# Patient Record
Sex: Male | Born: 1945
Health system: Southern US, Community
[De-identification: ages and names within clinical notes are randomized; demographics above are authoritative.]

## PROBLEM LIST (undated history)

## (undated) DIAGNOSIS — N529 Male erectile dysfunction, unspecified: Secondary | ICD-10-CM

## (undated) DIAGNOSIS — L719 Rosacea, unspecified: Secondary | ICD-10-CM

## (undated) DIAGNOSIS — H409 Unspecified glaucoma: Secondary | ICD-10-CM

## (undated) DIAGNOSIS — J189 Pneumonia, unspecified organism: Secondary | ICD-10-CM

## (undated) DIAGNOSIS — D684 Acquired coagulation factor deficiency: Secondary | ICD-10-CM

## (undated) DIAGNOSIS — D126 Benign neoplasm of colon, unspecified: Secondary | ICD-10-CM

## (undated) DIAGNOSIS — E119 Type 2 diabetes mellitus without complications: Secondary | ICD-10-CM

## (undated) DIAGNOSIS — K219 Gastro-esophageal reflux disease without esophagitis: Secondary | ICD-10-CM

## (undated) DIAGNOSIS — I2699 Other pulmonary embolism without acute cor pulmonale: Secondary | ICD-10-CM

## (undated) DIAGNOSIS — E786 Lipoprotein deficiency: Secondary | ICD-10-CM

## (undated) DIAGNOSIS — E785 Hyperlipidemia, unspecified: Secondary | ICD-10-CM

## (undated) DIAGNOSIS — D6861 Antiphospholipid syndrome: Secondary | ICD-10-CM

## (undated) DIAGNOSIS — K648 Other hemorrhoids: Secondary | ICD-10-CM

## (undated) DIAGNOSIS — N400 Enlarged prostate without lower urinary tract symptoms: Secondary | ICD-10-CM

## (undated) DIAGNOSIS — G40909 Epilepsy, unspecified, not intractable, without status epilepticus: Secondary | ICD-10-CM

## (undated) DIAGNOSIS — I82409 Acute embolism and thrombosis of unspecified deep veins of unspecified lower extremity: Principal | ICD-10-CM

## (undated) DIAGNOSIS — I10 Essential (primary) hypertension: Secondary | ICD-10-CM

## (undated) HISTORY — DX: Rosacea, unspecified: L71.9

## (undated) HISTORY — DX: Benign prostatic hyperplasia without lower urinary tract symptoms: N40.0

## (undated) HISTORY — DX: Gastro-esophageal reflux disease without esophagitis: K21.9

## (undated) HISTORY — DX: Lipoprotein deficiency: E78.6

## (undated) HISTORY — DX: Hyperlipidemia, unspecified: E78.5

## (undated) HISTORY — DX: Type 2 diabetes mellitus without complications: E11.9

## (undated) HISTORY — DX: Other hemorrhoids: K64.8

## (undated) HISTORY — PX: PROSTATE SURGERY: SHX751

## (undated) HISTORY — DX: Benign neoplasm of colon, unspecified: D12.6

## (undated) HISTORY — DX: Epilepsy, unspecified, not intractable, without status epilepticus: G40.909

## (undated) HISTORY — DX: Essential (primary) hypertension: I10

## (undated) HISTORY — DX: Other pulmonary embolism without acute cor pulmonale: I26.99

## (undated) HISTORY — DX: Male erectile dysfunction, unspecified: N52.9

## (undated) HISTORY — PX: CATARACT EXTRACTION, BILATERAL: SHX1313

## (undated) HISTORY — PX: INGUINAL HERNIA REPAIR: SUR1180

## (undated) HISTORY — DX: Antiphospholipid syndrome: D68.61

## (undated) HISTORY — PX: OTHER SURGICAL HISTORY: SHX169

## (undated) HISTORY — PX: ESOPHAGOGASTRODUODENOSCOPY: SHX1529

## (undated) HISTORY — DX: Acquired coagulation factor deficiency: D68.4

## (undated) HISTORY — DX: Unspecified glaucoma: H40.9

## (undated) HISTORY — PX: HERNIA REPAIR: SHX51

## (undated) HISTORY — DX: Acute embolism and thrombosis of unspecified deep veins of unspecified lower extremity: I82.409

---
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
## 1990-02-20 DIAGNOSIS — I2699 Other pulmonary embolism without acute cor pulmonale: Secondary | ICD-10-CM

## 1990-02-20 HISTORY — DX: Other pulmonary embolism without acute cor pulmonale: I26.99

## 2001-04-19 HISTORY — PX: OTHER SURGICAL HISTORY: SHX169

## 2002-07-07 HISTORY — PX: OTHER SURGICAL HISTORY: SHX169

## 2005-06-29 ENCOUNTER — Ambulatory Visit: Payer: Self-pay | Admitting: Internal Medicine

## 2005-06-30 ENCOUNTER — Ambulatory Visit: Payer: Self-pay

## 2005-07-06 ENCOUNTER — Ambulatory Visit: Payer: Self-pay | Admitting: Internal Medicine

## 2005-07-13 ENCOUNTER — Ambulatory Visit: Payer: Self-pay | Admitting: Internal Medicine

## 2005-07-24 ENCOUNTER — Ambulatory Visit: Payer: Self-pay | Admitting: Internal Medicine

## 2005-08-21 ENCOUNTER — Ambulatory Visit: Payer: Self-pay | Admitting: Internal Medicine

## 2005-08-30 ENCOUNTER — Ambulatory Visit: Payer: Self-pay | Admitting: Internal Medicine

## 2005-09-27 ENCOUNTER — Ambulatory Visit: Payer: Self-pay | Admitting: Family Medicine

## 2005-10-09 ENCOUNTER — Ambulatory Visit: Payer: Self-pay | Admitting: Internal Medicine

## 2005-10-24 ENCOUNTER — Ambulatory Visit: Payer: Self-pay | Admitting: Internal Medicine

## 2005-11-07 ENCOUNTER — Ambulatory Visit: Payer: Self-pay | Admitting: Internal Medicine

## 2005-11-29 ENCOUNTER — Ambulatory Visit: Payer: Self-pay | Admitting: Internal Medicine

## 2006-01-05 ENCOUNTER — Ambulatory Visit: Payer: Self-pay | Admitting: Internal Medicine

## 2006-03-07 DIAGNOSIS — E119 Type 2 diabetes mellitus without complications: Secondary | ICD-10-CM

## 2006-03-07 DIAGNOSIS — L719 Rosacea, unspecified: Secondary | ICD-10-CM

## 2006-03-07 DIAGNOSIS — N400 Enlarged prostate without lower urinary tract symptoms: Secondary | ICD-10-CM

## 2006-03-08 DIAGNOSIS — J309 Allergic rhinitis, unspecified: Secondary | ICD-10-CM | POA: Insufficient documentation

## 2006-03-08 DIAGNOSIS — E785 Hyperlipidemia, unspecified: Secondary | ICD-10-CM

## 2006-03-16 ENCOUNTER — Ambulatory Visit: Payer: Self-pay | Admitting: Internal Medicine

## 2006-04-02 ENCOUNTER — Ambulatory Visit: Payer: Self-pay | Admitting: Internal Medicine

## 2006-04-02 LAB — CONVERTED CEMR LAB: Hgb A1c MFr Bld: 6.1 % — ABNORMAL HIGH (ref 4.6–6.0)

## 2006-04-17 ENCOUNTER — Ambulatory Visit: Payer: Self-pay | Admitting: Internal Medicine

## 2006-04-23 ENCOUNTER — Ambulatory Visit: Payer: Self-pay | Admitting: Internal Medicine

## 2006-04-30 ENCOUNTER — Ambulatory Visit: Payer: Self-pay | Admitting: Internal Medicine

## 2006-05-07 ENCOUNTER — Ambulatory Visit: Payer: Self-pay | Admitting: Internal Medicine

## 2006-05-07 ENCOUNTER — Encounter (INDEPENDENT_AMBULATORY_CARE_PROVIDER_SITE_OTHER): Payer: Self-pay | Admitting: Specialist

## 2006-05-07 HISTORY — PX: COLONOSCOPY: SHX174

## 2006-05-18 ENCOUNTER — Ambulatory Visit: Payer: Self-pay | Admitting: Internal Medicine

## 2006-05-18 LAB — CONVERTED CEMR LAB: Prothrombin Time: 15.4 s

## 2006-05-22 ENCOUNTER — Encounter (INDEPENDENT_AMBULATORY_CARE_PROVIDER_SITE_OTHER): Payer: Self-pay | Admitting: *Deleted

## 2006-05-25 ENCOUNTER — Encounter: Payer: Self-pay | Admitting: Internal Medicine

## 2006-05-30 ENCOUNTER — Ambulatory Visit: Payer: Self-pay

## 2006-05-30 ENCOUNTER — Ambulatory Visit: Payer: Self-pay | Admitting: Internal Medicine

## 2006-05-30 LAB — CONVERTED CEMR LAB
Basophils Absolute: 0 10*3/uL (ref 0.0–0.1)
Creatinine, Ser: 0.9 mg/dL (ref 0.4–1.5)
Eosinophils Absolute: 0.1 10*3/uL (ref 0.0–0.6)
GFR calc non Af Amer: 91 mL/min
Glucose, Bld: 152 mg/dL — ABNORMAL HIGH (ref 70–99)
HCT: 37.9 % — ABNORMAL LOW (ref 39.0–52.0)
Hemoglobin: 13.5 g/dL (ref 13.0–17.0)
Hgb A1c MFr Bld: 6.1 % — ABNORMAL HIGH (ref 4.6–6.0)
MCHC: 35.5 g/dL (ref 30.0–36.0)
MCV: 91.7 fL (ref 78.0–100.0)
Monocytes Absolute: 0.3 10*3/uL (ref 0.2–0.7)
Neutrophils Relative %: 77.9 % — ABNORMAL HIGH (ref 43.0–77.0)
Potassium: 4.1 meq/L (ref 3.5–5.1)
Sodium: 140 meq/L (ref 135–145)

## 2006-06-01 ENCOUNTER — Encounter: Payer: Self-pay | Admitting: Internal Medicine

## 2006-06-04 ENCOUNTER — Encounter: Payer: Self-pay | Admitting: Internal Medicine

## 2006-06-04 LAB — CONVERTED CEMR LAB: Prothrombin Time: 16.3 s

## 2006-06-06 ENCOUNTER — Ambulatory Visit: Payer: Self-pay | Admitting: Internal Medicine

## 2006-06-06 LAB — CONVERTED CEMR LAB
INR: 2
Prothrombin Time: 17.3 s

## 2006-06-08 ENCOUNTER — Encounter: Payer: Self-pay | Admitting: Internal Medicine

## 2006-06-08 LAB — CONVERTED CEMR LAB: Prothrombin Time: 2 s

## 2006-06-12 ENCOUNTER — Ambulatory Visit: Payer: Self-pay | Admitting: Internal Medicine

## 2006-06-20 ENCOUNTER — Ambulatory Visit: Payer: Self-pay | Admitting: Internal Medicine

## 2006-06-20 LAB — CONVERTED CEMR LAB: INR: 2.3

## 2006-07-13 ENCOUNTER — Ambulatory Visit: Payer: Self-pay | Admitting: Internal Medicine

## 2006-07-17 ENCOUNTER — Ambulatory Visit: Payer: Self-pay | Admitting: Internal Medicine

## 2006-07-23 ENCOUNTER — Telehealth: Payer: Self-pay | Admitting: Internal Medicine

## 2006-07-25 ENCOUNTER — Ambulatory Visit: Payer: Self-pay | Admitting: Internal Medicine

## 2006-08-01 ENCOUNTER — Ambulatory Visit: Payer: Self-pay | Admitting: Internal Medicine

## 2006-08-07 LAB — CONVERTED CEMR LAB
ALT: 23 units/L (ref 0–40)
AST: 22 units/L (ref 0–37)
LDL Cholesterol: 94 mg/dL (ref 0–99)
Total CHOL/HDL Ratio: 5.1
VLDL: 32 mg/dL (ref 0–40)

## 2006-08-08 ENCOUNTER — Telehealth (INDEPENDENT_AMBULATORY_CARE_PROVIDER_SITE_OTHER): Payer: Self-pay | Admitting: *Deleted

## 2006-08-10 ENCOUNTER — Ambulatory Visit: Payer: Self-pay | Admitting: Family Medicine

## 2006-08-10 LAB — CONVERTED CEMR LAB: INR: 2.5

## 2006-08-14 ENCOUNTER — Encounter: Payer: Self-pay | Admitting: Internal Medicine

## 2006-09-07 ENCOUNTER — Ambulatory Visit: Payer: Self-pay | Admitting: Internal Medicine

## 2006-09-19 ENCOUNTER — Ambulatory Visit: Payer: Self-pay | Admitting: Family Medicine

## 2006-10-10 ENCOUNTER — Ambulatory Visit: Payer: Self-pay | Admitting: Internal Medicine

## 2006-10-10 LAB — CONVERTED CEMR LAB: INR: 3.1

## 2006-10-30 ENCOUNTER — Telehealth: Payer: Self-pay | Admitting: Internal Medicine

## 2006-11-08 ENCOUNTER — Ambulatory Visit: Payer: Self-pay | Admitting: Internal Medicine

## 2006-11-08 DIAGNOSIS — F528 Other sexual dysfunction not due to a substance or known physiological condition: Secondary | ICD-10-CM

## 2006-11-08 LAB — CONVERTED CEMR LAB: INR: 3.3

## 2006-11-12 ENCOUNTER — Telehealth: Payer: Self-pay | Admitting: Internal Medicine

## 2006-11-12 LAB — CONVERTED CEMR LAB
BUN: 12 mg/dL (ref 6–23)
Calcium: 10 mg/dL (ref 8.4–10.5)
Chloride: 106 meq/L (ref 96–112)
Creatinine, Ser: 1 mg/dL (ref 0.4–1.5)
Creatinine,U: 66.7 mg/dL
GFR calc non Af Amer: 81 mL/min
Hgb A1c MFr Bld: 6 % (ref 4.6–6.0)
Microalb Creat Ratio: 3 mg/g (ref 0.0–30.0)
Microalb, Ur: 0.2 mg/dL (ref 0.0–1.9)

## 2006-11-23 ENCOUNTER — Ambulatory Visit: Payer: Self-pay | Admitting: Internal Medicine

## 2006-11-23 LAB — CONVERTED CEMR LAB: INR: 2.8

## 2006-12-24 ENCOUNTER — Ambulatory Visit: Payer: Self-pay | Admitting: Internal Medicine

## 2007-01-03 ENCOUNTER — Encounter: Payer: Self-pay | Admitting: Internal Medicine

## 2007-01-03 ENCOUNTER — Telehealth (INDEPENDENT_AMBULATORY_CARE_PROVIDER_SITE_OTHER): Payer: Self-pay | Admitting: *Deleted

## 2007-01-07 ENCOUNTER — Ambulatory Visit: Payer: Self-pay | Admitting: Internal Medicine

## 2007-01-22 ENCOUNTER — Ambulatory Visit: Payer: Self-pay | Admitting: Internal Medicine

## 2007-01-22 LAB — CONVERTED CEMR LAB: INR: 2.3

## 2007-02-08 ENCOUNTER — Encounter: Payer: Self-pay | Admitting: Internal Medicine

## 2007-02-08 LAB — CONVERTED CEMR LAB: PSA: 3.24 ng/mL

## 2007-02-19 ENCOUNTER — Ambulatory Visit: Payer: Self-pay | Admitting: Internal Medicine

## 2007-03-20 ENCOUNTER — Ambulatory Visit: Payer: Self-pay | Admitting: Internal Medicine

## 2007-03-25 LAB — CONVERTED CEMR LAB: Hgb A1c MFr Bld: 6.1 % — ABNORMAL HIGH (ref 4.6–6.0)

## 2007-04-15 ENCOUNTER — Ambulatory Visit: Payer: Self-pay | Admitting: Internal Medicine

## 2007-04-15 LAB — CONVERTED CEMR LAB: INR: 2.4

## 2007-05-13 ENCOUNTER — Ambulatory Visit: Payer: Self-pay | Admitting: Internal Medicine

## 2007-05-13 LAB — CONVERTED CEMR LAB: INR: 2.5

## 2007-06-03 ENCOUNTER — Telehealth (INDEPENDENT_AMBULATORY_CARE_PROVIDER_SITE_OTHER): Payer: Self-pay | Admitting: *Deleted

## 2007-06-13 ENCOUNTER — Ambulatory Visit: Payer: Self-pay | Admitting: Internal Medicine

## 2007-07-05 ENCOUNTER — Ambulatory Visit: Payer: Self-pay | Admitting: Internal Medicine

## 2007-07-05 DIAGNOSIS — B351 Tinea unguium: Secondary | ICD-10-CM

## 2007-07-05 LAB — CONVERTED CEMR LAB
INR: 2.3
Prothrombin Time: 18.6 s

## 2007-07-10 LAB — CONVERTED CEMR LAB
ALT: 23 units/L (ref 0–53)
BUN: 18 mg/dL (ref 6–23)
Basophils Relative: 0.1 % (ref 0.0–1.0)
Chloride: 107 meq/L (ref 96–112)
Creatinine, Ser: 1.2 mg/dL (ref 0.4–1.5)
Eosinophils Absolute: 0 10*3/uL (ref 0.0–0.7)
Eosinophils Relative: 0.9 % (ref 0.0–5.0)
GFR calc non Af Amer: 65 mL/min
Glucose, Bld: 121 mg/dL — ABNORMAL HIGH (ref 70–99)
HCT: 44.3 % (ref 39.0–52.0)
Hgb A1c MFr Bld: 6 % (ref 4.6–6.0)
MCV: 94.9 fL (ref 78.0–100.0)
Microalb Creat Ratio: 28.6 mg/g (ref 0.0–30.0)
Monocytes Absolute: 0.4 10*3/uL (ref 0.1–1.0)
Monocytes Relative: 7.8 % (ref 3.0–12.0)
Neutrophils Relative %: 67.4 % (ref 43.0–77.0)
Platelets: 160 10*3/uL (ref 150–400)
Potassium: 4.4 meq/L (ref 3.5–5.1)
RBC: 4.67 M/uL (ref 4.22–5.81)
TSH: 2.06 microintl units/mL (ref 0.35–5.50)
Total CHOL/HDL Ratio: 6.3
WBC: 4.5 10*3/uL (ref 4.5–10.5)

## 2007-08-02 ENCOUNTER — Ambulatory Visit: Payer: Self-pay | Admitting: Internal Medicine

## 2007-08-02 LAB — CONVERTED CEMR LAB: Prothrombin Time: 2.4 s

## 2007-08-05 ENCOUNTER — Telehealth (INDEPENDENT_AMBULATORY_CARE_PROVIDER_SITE_OTHER): Payer: Self-pay | Admitting: *Deleted

## 2007-08-14 ENCOUNTER — Telehealth (INDEPENDENT_AMBULATORY_CARE_PROVIDER_SITE_OTHER): Payer: Self-pay | Admitting: *Deleted

## 2007-08-20 ENCOUNTER — Ambulatory Visit: Payer: Self-pay | Admitting: Internal Medicine

## 2007-09-22 ENCOUNTER — Encounter: Admission: RE | Admit: 2007-09-22 | Discharge: 2007-09-22 | Payer: Self-pay | Admitting: Specialist

## 2007-09-26 ENCOUNTER — Telehealth (INDEPENDENT_AMBULATORY_CARE_PROVIDER_SITE_OTHER): Payer: Self-pay | Admitting: *Deleted

## 2007-09-27 ENCOUNTER — Ambulatory Visit: Payer: Self-pay | Admitting: Internal Medicine

## 2007-10-15 ENCOUNTER — Encounter: Payer: Self-pay | Admitting: Internal Medicine

## 2007-10-30 ENCOUNTER — Ambulatory Visit: Payer: Self-pay | Admitting: Internal Medicine

## 2007-10-30 LAB — CONVERTED CEMR LAB
INR: 2.5
Prothrombin Time: 19.3 s

## 2007-11-27 ENCOUNTER — Ambulatory Visit: Payer: Self-pay | Admitting: Internal Medicine

## 2007-11-27 LAB — CONVERTED CEMR LAB
INR: 2.9
Prothrombin Time: 20.6 s

## 2007-12-25 ENCOUNTER — Ambulatory Visit: Payer: Self-pay | Admitting: Internal Medicine

## 2007-12-25 LAB — CONVERTED CEMR LAB
INR: 2.6
Prothrombin Time: 19.5 s

## 2008-01-13 ENCOUNTER — Ambulatory Visit: Payer: Self-pay | Admitting: Internal Medicine

## 2008-01-13 LAB — CONVERTED CEMR LAB
INR: 2.2
Prothrombin Time: 18 s

## 2008-01-15 LAB — CONVERTED CEMR LAB
Cholesterol: 159 mg/dL (ref 0–200)
Creatinine, Ser: 1.2 mg/dL (ref 0.4–1.5)
Hgb A1c MFr Bld: 6.5 % — ABNORMAL HIGH (ref 4.6–6.0)
LDL Cholesterol: 87 mg/dL (ref 0–99)
Potassium: 4.7 meq/L (ref 3.5–5.1)
Total CHOL/HDL Ratio: 4.2
VLDL: 34 mg/dL (ref 0–40)

## 2008-02-10 ENCOUNTER — Ambulatory Visit: Payer: Self-pay | Admitting: Internal Medicine

## 2008-03-09 ENCOUNTER — Ambulatory Visit: Payer: Self-pay | Admitting: Internal Medicine

## 2008-03-09 LAB — CONVERTED CEMR LAB: Prothrombin Time: 21.3 s

## 2008-04-06 ENCOUNTER — Ambulatory Visit: Payer: Self-pay | Admitting: Internal Medicine

## 2008-04-06 LAB — CONVERTED CEMR LAB: Prothrombin Time: 21 s

## 2008-05-04 ENCOUNTER — Ambulatory Visit: Payer: Self-pay | Admitting: Internal Medicine

## 2008-05-04 LAB — CONVERTED CEMR LAB
INR: 2.9
Prothrombin Time: 20.5 s

## 2008-05-08 ENCOUNTER — Encounter: Payer: Self-pay | Admitting: Internal Medicine

## 2008-06-01 ENCOUNTER — Ambulatory Visit: Payer: Self-pay | Admitting: Internal Medicine

## 2008-06-01 LAB — CONVERTED CEMR LAB: Prothrombin Time: 18.1 s

## 2008-06-12 ENCOUNTER — Ambulatory Visit: Payer: Self-pay | Admitting: Internal Medicine

## 2008-06-18 LAB — CONVERTED CEMR LAB
Chloride: 110 meq/L (ref 96–112)
GFR calc non Af Amer: 72.03 mL/min (ref >60)
Glucose, Bld: 163 mg/dL — ABNORMAL HIGH (ref 70–99)
Microalb, Ur: 0.5 mg/dL (ref 0.0–1.9)
Potassium: 4.8 meq/L (ref 3.5–5.1)
Sodium: 142 meq/L (ref 135–145)

## 2008-06-29 ENCOUNTER — Ambulatory Visit: Payer: Self-pay | Admitting: Internal Medicine

## 2008-06-29 LAB — CONVERTED CEMR LAB: INR: 2.6

## 2008-07-31 ENCOUNTER — Ambulatory Visit: Payer: Self-pay | Admitting: Internal Medicine

## 2008-09-01 ENCOUNTER — Ambulatory Visit: Payer: Self-pay | Admitting: Internal Medicine

## 2008-09-01 LAB — CONVERTED CEMR LAB
INR: 2.4
Prothrombin Time: 19 s

## 2008-10-06 ENCOUNTER — Ambulatory Visit: Payer: Self-pay | Admitting: Internal Medicine

## 2008-10-06 LAB — CONVERTED CEMR LAB: Prothrombin Time: 20.2 s

## 2008-10-16 ENCOUNTER — Ambulatory Visit: Payer: Self-pay | Admitting: Internal Medicine

## 2008-10-27 ENCOUNTER — Encounter (INDEPENDENT_AMBULATORY_CARE_PROVIDER_SITE_OTHER): Payer: Self-pay | Admitting: *Deleted

## 2008-10-27 LAB — CONVERTED CEMR LAB
Basophils Relative: 0 % (ref 0.0–3.0)
Eosinophils Relative: 1 % (ref 0.0–5.0)
HCT: 46.9 % (ref 39.0–52.0)
Hemoglobin: 16.1 g/dL (ref 13.0–17.0)
Hgb A1c MFr Bld: 6.5 % (ref 4.6–6.5)
Lymphs Abs: 1 10*3/uL (ref 0.7–4.0)
Monocytes Relative: 6.5 % (ref 3.0–12.0)
Neutro Abs: 2.9 10*3/uL (ref 1.4–7.7)
RBC: 4.88 M/uL (ref 4.22–5.81)
TSH: 1.67 microintl units/mL (ref 0.35–5.50)
WBC: 4.2 10*3/uL — ABNORMAL LOW (ref 4.5–10.5)

## 2008-11-06 ENCOUNTER — Ambulatory Visit: Payer: Self-pay | Admitting: Internal Medicine

## 2008-11-06 LAB — CONVERTED CEMR LAB: INR: 2.6

## 2008-12-04 ENCOUNTER — Ambulatory Visit: Payer: Self-pay | Admitting: Internal Medicine

## 2008-12-04 LAB — CONVERTED CEMR LAB: Prothrombin Time: 23.1 s

## 2008-12-18 ENCOUNTER — Ambulatory Visit: Payer: Self-pay | Admitting: Internal Medicine

## 2008-12-18 LAB — CONVERTED CEMR LAB: Prothrombin Time: 23.2 s

## 2008-12-24 ENCOUNTER — Ambulatory Visit: Payer: Self-pay | Admitting: Internal Medicine

## 2008-12-24 DIAGNOSIS — M5430 Sciatica, unspecified side: Secondary | ICD-10-CM

## 2008-12-24 LAB — CONVERTED CEMR LAB: Hemoglobin: 17.1 g/dL

## 2009-01-01 ENCOUNTER — Ambulatory Visit: Payer: Self-pay | Admitting: Internal Medicine

## 2009-01-01 LAB — CONVERTED CEMR LAB: INR: 2.8

## 2009-01-04 ENCOUNTER — Telehealth (INDEPENDENT_AMBULATORY_CARE_PROVIDER_SITE_OTHER): Payer: Self-pay | Admitting: *Deleted

## 2009-01-06 ENCOUNTER — Telehealth: Payer: Self-pay | Admitting: Internal Medicine

## 2009-01-22 ENCOUNTER — Ambulatory Visit: Payer: Self-pay | Admitting: Internal Medicine

## 2009-01-26 ENCOUNTER — Encounter: Payer: Self-pay | Admitting: Internal Medicine

## 2009-02-18 ENCOUNTER — Encounter: Payer: Self-pay | Admitting: Internal Medicine

## 2009-02-20 DIAGNOSIS — I82409 Acute embolism and thrombosis of unspecified deep veins of unspecified lower extremity: Secondary | ICD-10-CM

## 2009-02-20 HISTORY — DX: Acute embolism and thrombosis of unspecified deep veins of unspecified lower extremity: I82.409

## 2009-02-22 ENCOUNTER — Ambulatory Visit: Payer: Self-pay | Admitting: Internal Medicine

## 2009-02-22 DIAGNOSIS — K4090 Unilateral inguinal hernia, without obstruction or gangrene, not specified as recurrent: Secondary | ICD-10-CM | POA: Insufficient documentation

## 2009-02-22 DIAGNOSIS — R42 Dizziness and giddiness: Secondary | ICD-10-CM

## 2009-02-22 LAB — CONVERTED CEMR LAB: Prothrombin Time: 17.5 s

## 2009-02-25 ENCOUNTER — Encounter (INDEPENDENT_AMBULATORY_CARE_PROVIDER_SITE_OTHER): Payer: Self-pay | Admitting: *Deleted

## 2009-02-25 LAB — CONVERTED CEMR LAB
BUN: 13 mg/dL (ref 6–23)
Chloride: 102 meq/L (ref 96–112)
Potassium: 4.5 meq/L (ref 3.5–5.1)

## 2009-03-03 ENCOUNTER — Encounter: Payer: Self-pay | Admitting: Internal Medicine

## 2009-03-12 ENCOUNTER — Ambulatory Visit: Payer: Self-pay | Admitting: Vascular Surgery

## 2009-03-12 ENCOUNTER — Inpatient Hospital Stay (HOSPITAL_COMMUNITY): Admission: AD | Admit: 2009-03-12 | Discharge: 2009-03-16 | Payer: Self-pay | Admitting: Internal Medicine

## 2009-03-12 ENCOUNTER — Telehealth: Payer: Self-pay | Admitting: Internal Medicine

## 2009-03-12 ENCOUNTER — Encounter: Payer: Self-pay | Admitting: Internal Medicine

## 2009-03-12 ENCOUNTER — Ambulatory Visit: Payer: Self-pay | Admitting: Internal Medicine

## 2009-03-12 LAB — CONVERTED CEMR LAB: Blood Glucose, Fingerstick: 178

## 2009-03-12 IMAGING — CR DG ANKLE COMPLETE 3+V*L*
3 series · 3 of 3 positions shown · non-contrast
Comparison: None

CLINICAL DATA: Diabetic.  Cellulitis.  Soft tissue swelling.

LEFT ANKLE COMPLETE - 3+ VIEW

[t ankle joint ap left *]
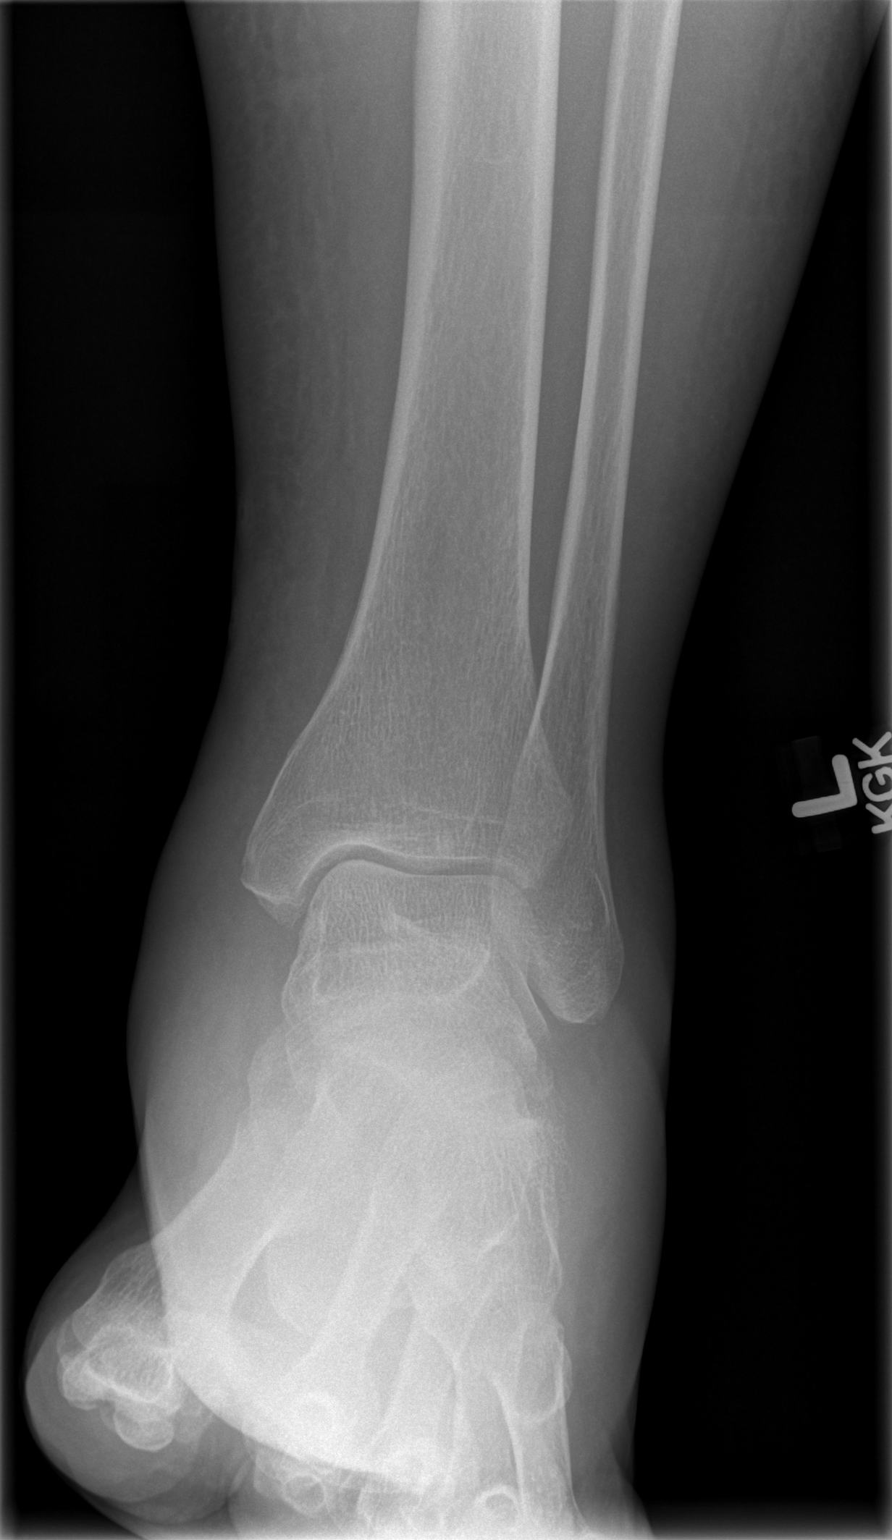

[t ankle joint oblique left *]
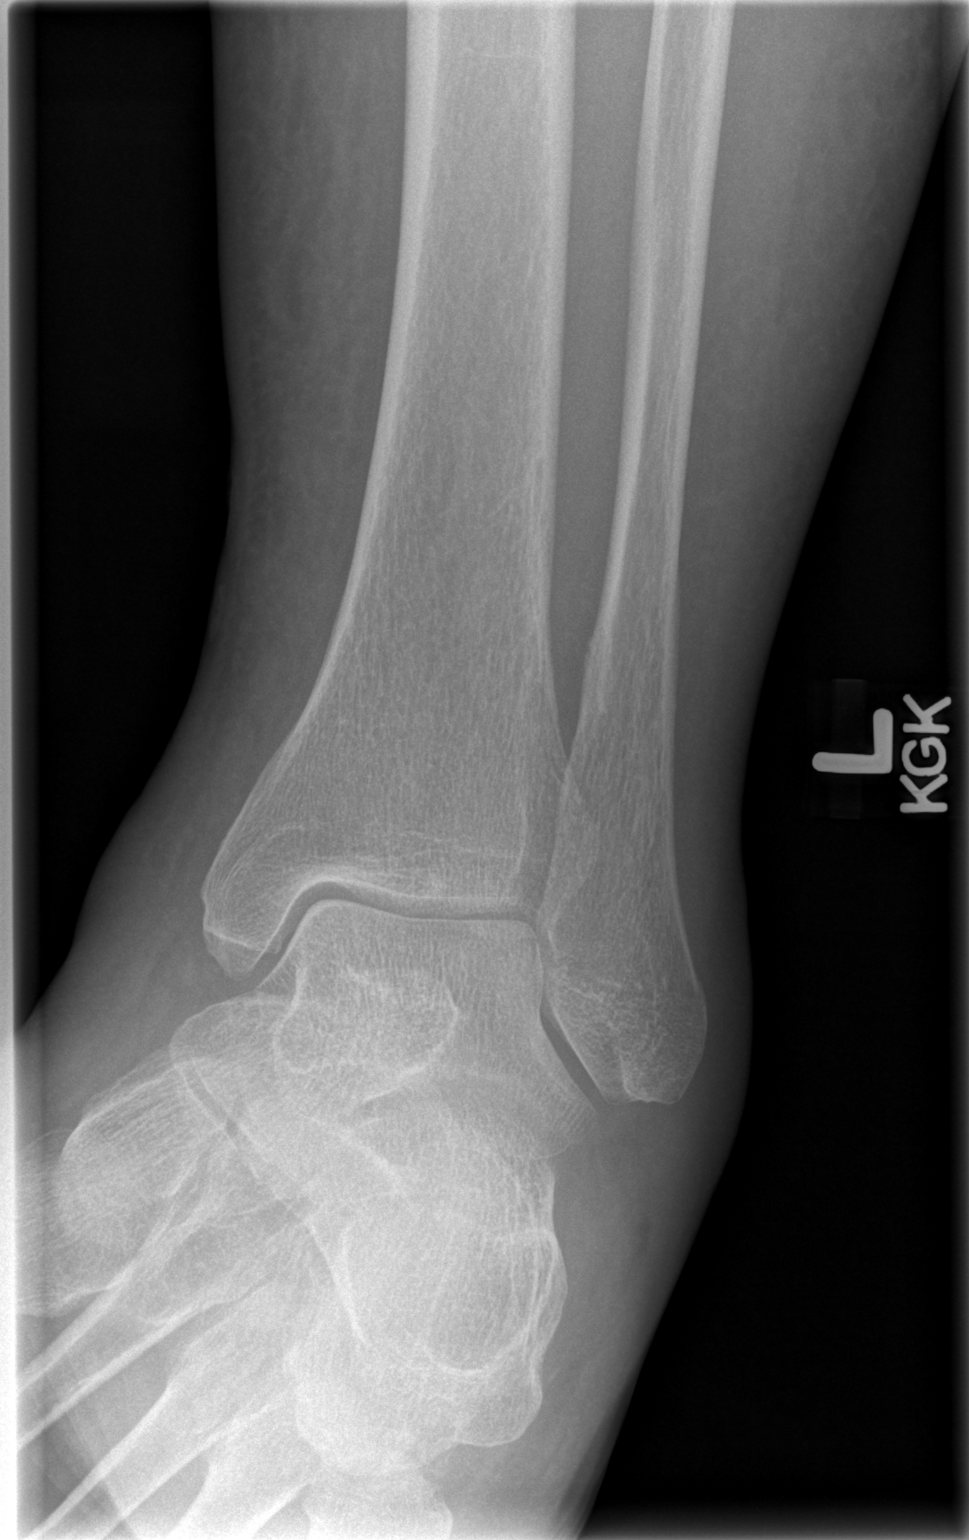

[t ankle joint lat left *]
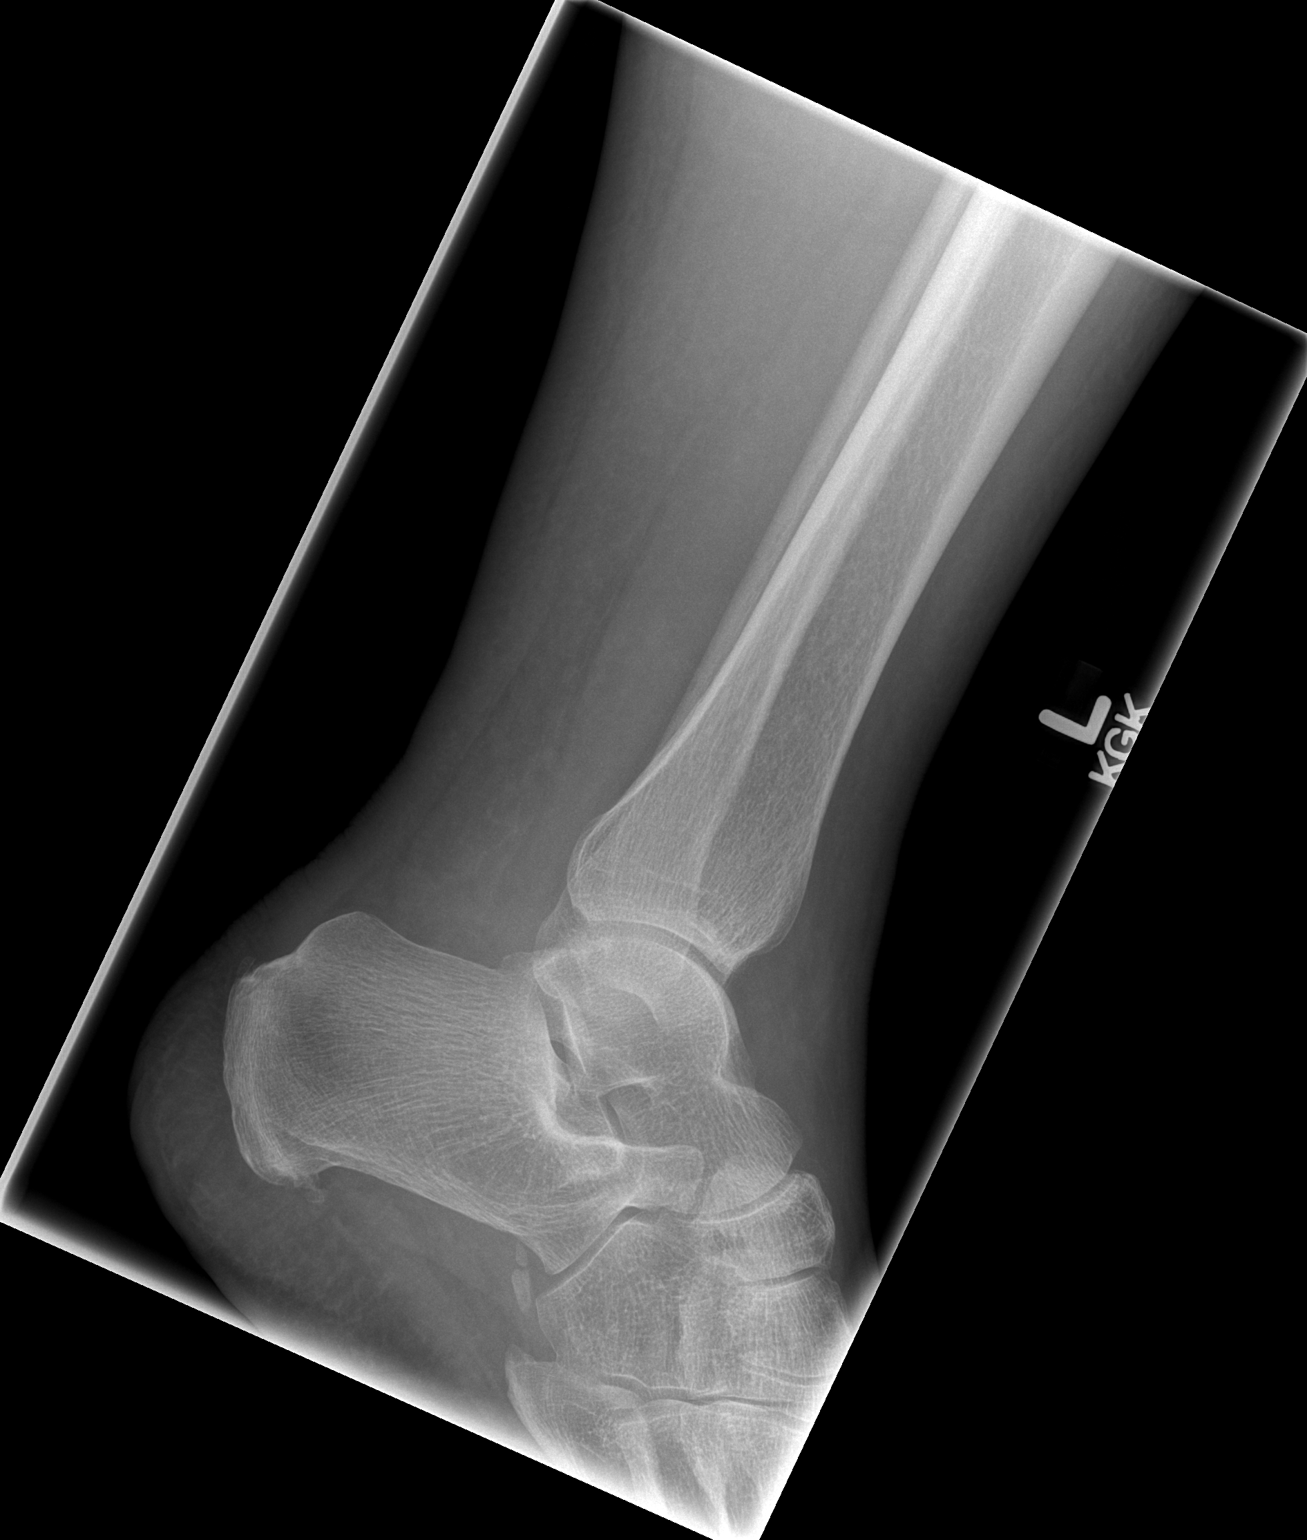

[3 of 3 positions shown; findings below may reference images not displayed]

FINDINGS: Extensive soft tissue swelling mainly medially.  No bony
lysis or periosteal reaction.
IMPRESSION: No acute bony abnormality.  Marked soft tissue swelling.

## 2009-03-16 ENCOUNTER — Encounter: Payer: Self-pay | Admitting: Internal Medicine

## 2009-03-17 ENCOUNTER — Ambulatory Visit: Payer: Self-pay | Admitting: Internal Medicine

## 2009-03-17 DIAGNOSIS — I82409 Acute embolism and thrombosis of unspecified deep veins of unspecified lower extremity: Secondary | ICD-10-CM | POA: Insufficient documentation

## 2009-03-17 LAB — CONVERTED CEMR LAB
INR: 2.6 — ABNORMAL HIGH (ref 0.8–1.0)
Prothrombin Time: 26.6 s — ABNORMAL HIGH (ref 9.1–11.7)

## 2009-03-19 ENCOUNTER — Ambulatory Visit: Payer: Self-pay | Admitting: Internal Medicine

## 2009-03-19 DIAGNOSIS — I868 Varicose veins of other specified sites: Secondary | ICD-10-CM

## 2009-03-23 ENCOUNTER — Ambulatory Visit: Payer: Self-pay | Admitting: Internal Medicine

## 2009-03-24 LAB — CONVERTED CEMR LAB: Prothrombin Time: 23.2 s — ABNORMAL HIGH (ref 9.1–11.7)

## 2009-03-25 ENCOUNTER — Telehealth: Payer: Self-pay | Admitting: Internal Medicine

## 2009-03-25 ENCOUNTER — Encounter: Payer: Self-pay | Admitting: Internal Medicine

## 2009-04-01 ENCOUNTER — Telehealth: Payer: Self-pay | Admitting: Internal Medicine

## 2009-04-05 ENCOUNTER — Ambulatory Visit: Payer: Self-pay | Admitting: Internal Medicine

## 2009-04-05 LAB — CONVERTED CEMR LAB
INR: 3.2 — ABNORMAL HIGH (ref 0.8–1.0)
Prothrombin Time: 33.3 s — ABNORMAL HIGH (ref 9.1–11.7)

## 2009-04-14 ENCOUNTER — Encounter: Payer: Self-pay | Admitting: Internal Medicine

## 2009-04-14 ENCOUNTER — Telehealth: Payer: Self-pay | Admitting: Internal Medicine

## 2009-04-15 ENCOUNTER — Ambulatory Visit: Payer: Self-pay | Admitting: Internal Medicine

## 2009-04-15 ENCOUNTER — Encounter: Payer: Self-pay | Admitting: Internal Medicine

## 2009-04-15 LAB — CONVERTED CEMR LAB: INR: 4.8

## 2009-04-26 ENCOUNTER — Ambulatory Visit: Payer: Self-pay | Admitting: Internal Medicine

## 2009-04-26 LAB — CONVERTED CEMR LAB: INR: 2.6

## 2009-05-10 ENCOUNTER — Ambulatory Visit: Payer: Self-pay | Admitting: Internal Medicine

## 2009-06-07 ENCOUNTER — Ambulatory Visit: Payer: Self-pay | Admitting: Internal Medicine

## 2009-06-07 LAB — CONVERTED CEMR LAB: INR: 2.7

## 2009-06-21 ENCOUNTER — Encounter: Payer: Self-pay | Admitting: Internal Medicine

## 2009-06-22 ENCOUNTER — Ambulatory Visit: Payer: Self-pay | Admitting: Internal Medicine

## 2009-06-24 ENCOUNTER — Encounter: Payer: Self-pay | Admitting: Internal Medicine

## 2009-06-25 ENCOUNTER — Encounter: Payer: Self-pay | Admitting: Internal Medicine

## 2009-06-25 ENCOUNTER — Ambulatory Visit: Payer: Self-pay

## 2009-07-01 LAB — CONVERTED CEMR LAB
ALT: 25 units/L (ref 0–53)
AST: 26 units/L (ref 0–37)
Cholesterol: 174 mg/dL (ref 0–200)
Creatinine,U: 117.1 mg/dL
Microalb Creat Ratio: 1.7 mg/g (ref 0.0–30.0)

## 2009-07-05 ENCOUNTER — Encounter: Payer: Self-pay | Admitting: Internal Medicine

## 2009-07-21 ENCOUNTER — Ambulatory Visit: Payer: Self-pay | Admitting: Internal Medicine

## 2009-07-28 ENCOUNTER — Telehealth (INDEPENDENT_AMBULATORY_CARE_PROVIDER_SITE_OTHER): Payer: Self-pay | Admitting: *Deleted

## 2009-08-09 ENCOUNTER — Ambulatory Visit: Payer: Self-pay | Admitting: Internal Medicine

## 2009-08-09 LAB — CONVERTED CEMR LAB: INR: 1.4

## 2009-08-11 ENCOUNTER — Ambulatory Visit: Payer: Self-pay | Admitting: Internal Medicine

## 2009-08-11 LAB — CONVERTED CEMR LAB: INR: 1.2

## 2009-08-13 ENCOUNTER — Telehealth: Payer: Self-pay | Admitting: Internal Medicine

## 2009-08-13 ENCOUNTER — Encounter: Payer: Self-pay | Admitting: Urology

## 2009-08-13 ENCOUNTER — Inpatient Hospital Stay (HOSPITAL_COMMUNITY): Admission: RE | Admit: 2009-08-13 | Discharge: 2009-08-16 | Payer: Self-pay | Admitting: Surgery

## 2009-08-13 HISTORY — PX: TRANSURETHRAL RESECTION OF PROSTATE: SHX73

## 2009-08-18 ENCOUNTER — Ambulatory Visit: Payer: Self-pay | Admitting: Internal Medicine

## 2009-08-18 LAB — CONVERTED CEMR LAB: INR: 1.2

## 2009-08-20 ENCOUNTER — Ambulatory Visit: Payer: Self-pay | Admitting: Internal Medicine

## 2009-08-20 LAB — CONVERTED CEMR LAB: INR: 1.3

## 2009-08-24 ENCOUNTER — Ambulatory Visit: Payer: Self-pay | Admitting: Internal Medicine

## 2009-08-24 LAB — CONVERTED CEMR LAB: INR: 1.8

## 2009-08-26 ENCOUNTER — Ambulatory Visit: Payer: Self-pay | Admitting: Internal Medicine

## 2009-08-27 ENCOUNTER — Telehealth: Payer: Self-pay | Admitting: Internal Medicine

## 2009-08-30 ENCOUNTER — Ambulatory Visit: Payer: Self-pay | Admitting: Internal Medicine

## 2009-09-01 ENCOUNTER — Encounter: Payer: Self-pay | Admitting: Internal Medicine

## 2009-09-03 ENCOUNTER — Encounter: Payer: Self-pay | Admitting: Internal Medicine

## 2009-09-08 ENCOUNTER — Ambulatory Visit: Payer: Self-pay | Admitting: Internal Medicine

## 2009-09-22 ENCOUNTER — Ambulatory Visit: Payer: Self-pay | Admitting: Internal Medicine

## 2009-10-11 ENCOUNTER — Ambulatory Visit: Payer: Self-pay | Admitting: Internal Medicine

## 2009-10-22 ENCOUNTER — Ambulatory Visit: Payer: Self-pay | Admitting: Internal Medicine

## 2009-10-22 LAB — CONVERTED CEMR LAB: INR: 2.2

## 2009-11-12 ENCOUNTER — Ambulatory Visit: Payer: Self-pay | Admitting: Internal Medicine

## 2009-11-12 LAB — CONVERTED CEMR LAB: POC INR: 2.7

## 2009-12-10 ENCOUNTER — Ambulatory Visit: Payer: Self-pay | Admitting: Internal Medicine

## 2009-12-10 LAB — CONVERTED CEMR LAB: INR: 2.7

## 2009-12-30 ENCOUNTER — Ambulatory Visit: Payer: Self-pay | Admitting: Internal Medicine

## 2009-12-30 DIAGNOSIS — R21 Rash and other nonspecific skin eruption: Secondary | ICD-10-CM | POA: Insufficient documentation

## 2009-12-30 LAB — HM DIABETES FOOT EXAM

## 2010-01-03 LAB — CONVERTED CEMR LAB
AST: 24 units/L (ref 0–37)
BUN: 21 mg/dL (ref 6–23)
Basophils Absolute: 0 10*3/uL (ref 0.0–0.1)
Basophils Relative: 0.4 % (ref 0.0–3.0)
CO2: 27 meq/L (ref 19–32)
Chloride: 107 meq/L (ref 96–112)
Creatinine, Ser: 1 mg/dL (ref 0.4–1.5)
HCT: 45.8 % (ref 39.0–52.0)
HDL: 36.2 mg/dL — ABNORMAL LOW (ref >39.00)
Hemoglobin: 15.8 g/dL (ref 13.0–17.0)
Hgb A1c MFr Bld: 7.5 % — ABNORMAL HIGH (ref 4.6–6.5)
Lymphocytes Relative: 24.5 % (ref 12.0–46.0)
Lymphs Abs: 1.2 10*3/uL (ref 0.7–4.0)
Monocytes Relative: 8 % (ref 3.0–12.0)
Neutro Abs: 3.1 10*3/uL (ref 1.4–7.7)
Potassium: 5 meq/L (ref 3.5–5.1)
RBC: 4.88 M/uL (ref 4.22–5.81)
RDW: 13.8 % (ref 11.5–14.6)
Triglycerides: 172 mg/dL — ABNORMAL HIGH (ref 0.0–149.0)

## 2010-01-07 ENCOUNTER — Ambulatory Visit: Payer: Self-pay | Admitting: Internal Medicine

## 2010-02-04 ENCOUNTER — Ambulatory Visit: Payer: Self-pay | Admitting: Internal Medicine

## 2010-02-04 LAB — CONVERTED CEMR LAB: INR: 2.3

## 2010-03-04 ENCOUNTER — Ambulatory Visit
Admission: RE | Admit: 2010-03-04 | Discharge: 2010-03-04 | Payer: Self-pay | Source: Home / Self Care | Attending: Internal Medicine | Admitting: Internal Medicine

## 2010-03-04 LAB — CONVERTED CEMR LAB: INR: 1.9

## 2010-03-07 ENCOUNTER — Encounter: Payer: Self-pay | Admitting: Internal Medicine

## 2010-03-21 ENCOUNTER — Ambulatory Visit
Admission: RE | Admit: 2010-03-21 | Discharge: 2010-03-21 | Payer: Self-pay | Source: Home / Self Care | Attending: Internal Medicine | Admitting: Internal Medicine

## 2010-03-21 LAB — CONVERTED CEMR LAB: INR: 2.6

## 2010-03-22 NOTE — Assessment & Plan Note (Signed)
Summary: PT CHECK////SPH   Nurse Visit   Vital Signs:  Patient profile:   65 year old male Height:      70 inches Weight:      183 pounds BP sitting:   120 / 80  Vitals Entered By: Shary Decamp (June 07, 2009 10:56 AM)   Allergies: No Known Drug Allergies Laboratory Results   Blood Tests      INR: 2.7   (Normal Range: 0.88-1.12   Therap INR: 2.0-3.5) Comments: CURRENT DOSE:  5mg  daily, 2.5 on M, W, F no change recheck in 4 weeks Shary Decamp  June 07, 2009 10:57 AM Elita Quick E. Yousif Edelson MD  June 08, 2009 1:33 PM     Orders Added: 1)  Est. Patient Level I [99211] 2)  Protime [16109UE]

## 2010-03-22 NOTE — Miscellaneous (Signed)
Summary: Living Will  Living Will   Imported By: Lanelle Bal 08/18/2009 11:41:02  _____________________________________________________________________  External Attachment:    Type:   Image     Comment:   External Document

## 2010-03-22 NOTE — Assessment & Plan Note (Signed)
Summary: PT CHECK/KN   Nurse Visit   Vital Signs:  Patient profile:   65 year old male Height:      70 inches (177.80 cm) Weight:      176.13 pounds (80.06 kg) BMI:     25.36 Temp:     98.4 degrees F (36.89 degrees C) oral BP sitting:   110 / 62  (left arm)  Vitals Entered By: Lucious Groves CMA (January 07, 2010 10:55 AM) CC: PT check/kb   Allergies: No Known Drug Allergies Laboratory Results   Blood Tests   Date/Time Received: Lucious Groves [facility]  January 07, 2010 10:55 AM Date/Time Reported: Lucious Groves CMA  January 07, 2010 10:55 AM   INR: 2.0   (Normal Range: 0.88-1.12   Therap INR: 2.0-3.5) Comments: Patient notes that he has 5mg  tabs at home and take 1 tab M,W,F and 1/2 tab all other days. Patient was advised to continue the same dose and re-check in 4 weeks. Lucious Groves CMA  January 07, 2010 10:56 AM  agree Nolon Rod. Paz MD  January 09, 2010 12:10 PM     Orders Added: 1)  Est. Patient Level I [99211] 2)  Protime [62130QM]    ANTICOAGULATION RECORD PREVIOUS REGIMEN & LAB RESULTS Anticoagulation Diagnosis:  Deep venous thrombosis on  12/10/2009  Previous INR:  2.7 on  12/10/2009 Previous Coumadin Dose(mg):  5mg  3 days, 2.5mg  all other days on  12/10/2009 Previous Regimen:  SAME on  01/01/2009 Previous Coagulation Comments:  no change 4 weeks on  09/27/2007  NEW REGIMEN & LAB RESULTS Current INR: 2.0 Regimen: SAME  (no change)   Anticoagulation Visit Questionnaire Coumadin dose missed/changed:  No Abnormal Bleeding Symptoms:  No  Any diet changes including alcohol intake, vegetables or greens since the last visit:  No Any illnesses or hospitalizations since the last visit:  No Any signs of clotting since the last visit (including chest discomfort, dizziness, shortness of breath, arm tingling, slurred speech, swelling or redness in leg):  No  MEDICATIONS GLIPIZIDE 2.5 MG TB24 (GLIPIZIDE) Take 2 tablet by mouth once a day METROGEL 1 % GEL  (METRONIDAZOLE) Apply a small amount to affected area at bedtime NIASPAN 1000 MG TBCR (NIACIN (ANTIHYPERLIPIDEMIC)) 2 tablet by mouth once a day FLONASE 50 MCG/ACT SUSP (FLUTICASONE PROPIONATE) 1 spray each nostril bid TETRACYCLINE HCL 500 MG CAPS (TETRACYCLINE HCL) 1 by mouth once daily LISINOPRIL 5 MG TABS (LISINOPRIL) 1 a day FENOFIBRATE 160 MG  TABS (FENOFIBRATE) 1 by mouth qd * ONE TOUCH ULTRA TEST STRIPS  WARFARIN SODIUM 5 MG TABS (WARFARIN SODIUM) as directed LEVITRA 20 MG TABS (VARDENAFIL HCL) once a week as needed * MVI/FISH OIL  * CO Q10  KETOCONAZOLE 2 % CREA (KETOCONAZOLE) apply twice a day for 2 weeks, left foot

## 2010-03-22 NOTE — Progress Notes (Signed)
Summary: timing of hernia repair and TURP  Phone Note Other Incoming   Summary of Call: spoke with Dr. Michaell Cowing at [facility] surgery the patient needs a non-urgent correction of a hernia, apparently he will need  also a TURP the question was the timing of the procedures. He just had a DVT in January 2011, I think is reasonable to wait 6 months unless he needs emergency surgery. He will also need a Lovenox bridge, see past medical history Garyn Waguespack E. Natally Ribera MD  April 14, 2009 2:34 PM

## 2010-03-22 NOTE — Progress Notes (Signed)
Summary: IP ADMISSION PRIOR-AUTH BCBS  Phone Note Outgoing Call   Call placed by: Magdalen Spatz [facility],  March 12, 2009 11:49 AM Call placed to: Database administrator of Call: IN REF TO IP ADMISSION TO [facility] ON 03-12-2009, TO ROOM [address] FOR LT LEG CELLULITIS, DX 682.6, I HAVE CONTACTED BC[employer] INSUR & STARTED PRE-AUTH.  THE "PENDING" REFERENCE #[id number].  ALL CLINICALS MUST NOW BE FAXED TO CLINICAL REVIEW/BCBS USING REF #, TO FAX [phone].  THIS CASE IS IN REVIEW W/BCBS, TURN AROUND TIME IS 24-72 HOURS, AND WE WILL BE NOTIFIED BY FAX OF APPROVAL.    I CALLED [facility] ([phone]) & S/W BONNIE IN ADMISSIONS, SHE TOOK DOWN ABOVE INFO.  PER BONNIE, ONCE APPROVAL IS REC'D FROM BCBS, I AM TO FAX TO ATTN CARMEN-[facility] ADMISSIONS FAX# [phone]. Initial call taken by: Magdalen Spatz [facility]  March 12, 2009 11:49 AM  Follow-up for Phone Call        FAX REC'D FROM [facility] AUTHORIZING IP ADMISSION.  AUTH# [id number], WHICH IS SAME AS "PENDING" REF #.  I HAVE FAXED AUTH TO ATTN CARMEN @ FAX [phone], AND ORIGINAL SENT TO BE SCANNED INTO EMR.  Follow-up by: Magdalen Spatz [facility],  March 12, 2009 3:26 PM

## 2010-03-22 NOTE — Progress Notes (Signed)
Summary: refill request  Phone Note Refill Request Call back at [phone] Message from:  Pharmacy on July 28, 2009 2:14 PM  Refills Requested: Medication #1:  LISINOPRIL 5 MG TABS 1 a day   Dosage confirmed as above?Dosage Confirmed   Supply Requested: 3 months  Medication #2:  GLIPIZIDE 2.5 MG TB24 Take 1 tablet by mouth once a day   Dosage confirmed as above?Dosage Confirmed   Supply Requested: 3 months [facility]  Next Appointment Scheduled: none Initial call taken by: Harold Barban,  July 28, 2009 2:15 PM    Prescriptions: LISINOPRIL 5 MG TABS (LISINOPRIL) 1 a day  #90 x 0   Entered by:   Jeremy Johann CMA   Authorized by:   Nolon Rod. Paz MD   Signed by:   Jeremy Johann CMA on 07/28/2009   Method used:   Faxed to ...       [facility]* (mail-order)             ,          Ph: [phone]       Fax: [phone]   RxID:   [id number] GLIPIZIDE 2.5 MG TB24 (GLIPIZIDE) Take 1 tablet by mouth once a day  #90 x 0   Entered by:   Jeremy Johann CMA   Authorized by:   Nolon Rod. Paz MD   Signed by:   Jeremy Johann CMA on 07/28/2009   Method used:   Faxed to ...       [facility]* (mail-order)             ,          Ph: [phone]       Fax: [phone]   RxID:   [id number]

## 2010-03-22 NOTE — Assessment & Plan Note (Signed)
Summary: FASTING CPX//KN   Vital Signs:  Patient profile:   65 year old male Height:      70 inches Weight:      174.25 pounds Pulse rate:   100 / minute Pulse rhythm:   regular BP sitting:   128 / 82  (left arm) Cuff size:   large  Vitals Entered By: Army Fossa CMA (December 30, 2009 8:14 AM) CC: CPX, fasting Comments [facility] [address] [facility]   History of Present Illness: CPX rash  in the L foot x months , over-the-counter creams not helping, no itching, does hurt some  Preventive Screening-Counseling & Management  Caffeine-Diet-Exercise     Does Patient Exercise: no  Current Medications (verified): 1)  Glipizide 2.5 Mg Tb24 (Glipizide) .... Take 1 Tablet By Mouth Once A Day 2)  Metrogel 1 % Gel (Metronidazole) .... Apply A Small Amount To Affected Area At Bedtime 3)  Niaspan 1000 Mg Tbcr (Niacin (Antihyperlipidemic)) .... 2 Tablet By Mouth Once A Day 4)  Flonase 50 Mcg/act Susp (Fluticasone Propionate) .Marland Kitchen.. 1 Spray Each Nostril Bid 5)  Tetracycline Hcl 500 Mg Caps (Tetracycline Hcl) .Marland Kitchen.. 1 By Mouth Once Daily 6)  Lisinopril 5 Mg Tabs (Lisinopril) .Marland Kitchen.. 1 A Day 7)  Fenofibrate 160 Mg  Tabs (Fenofibrate) .Marland Kitchen.. 1 By Mouth Qd 8)  One Touch Ultra Test Strips 9)  Warfarin Sodium 5 Mg Tabs (Warfarin Sodium) .... As Directed 10)  Levitra 20 Mg Tabs (Vardenafil Hcl) .... Once A Week As Needed 11)  Mvi/fish Oil 12)  Co Q10  Allergies (verified): No Known Drug Allergies  Past History:  Past Medical History: Reviewed history from 09/22/2009 and no changes required. Allergic rhinitis Hyperlipidemia AODM h/o BPH Pulmonary embolism, hx of (1992), Bilateral with PE, reportedly (+) Lupus anticoagulant large left lower extremity DVT 02/2009 ED Rosacea neg stress test in [place] (aprox 2005)--per pt  Past Surgical History: Reviewed history from 09/22/2009 and no changes required. Inguinal herniorrhaphy (right-sided, 1995, mesh placed  ) Tonsillectomy 07-2009: --Transurethral resection of the prostate performed on August 13, 2009.  --Right inguinal hernia repair with mesh (laparoscopic).   Family History: DM-- M, uncle  MI--no strokes-- grandparents. colon cancer --no prostate cancer--no melanoma--M   Social History: Reviewed history from 10/16/2008 and no changes required. moved from [place] in 2007 (original from 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 Resp:  Denies cough and shortness of breath. GI:  Denies bloody stools, nausea, and vomiting. GU:  Denies incontinence, urinary frequency, and urinary hesitancy. Endo:  ambulatory CBGs slightly  higher than before,  ~ 150 in AM (fasting).  Physical Exam  General:  alert, well-developed, and well-nourished.   Neck:  no masses and no thyromegaly.   Lungs:  normal respiratory effort, no intercostal retractions, no accessory muscle use, and normal breath sounds.   Heart:   normal rate, regular rhythm, and no murmur.   Abdomen:  soft, non-tender, no distention, no masses, no guarding, and no rigidity.   Pulses:  pedal pulses decreased but positive bilaterally Extremities:  no lower leg edema Skin:  on the inner aspect of the left heel he has an area were the skin is slightly thick-dark , has few satellite red macular lesions around 2 mm distributed in a ring fashion. Psych:  Oriented X3, memory intact  for recent and remote, normally interactive, good eye contact, not anxious appearing, and not depressed appearing.    Diabetes Management Exam:    Foot Exam (with socks and/or shoes not present):       Sensory-Pinprick/Light touch:          Left medial foot (L-4): normal          Left dorsal foot (L-5): normal           Left lateral foot (S-1): normal          Right medial foot (L-4): normal          Right dorsal foot (L-5): normal          Right lateral foot (S-1): normal       Sensory-Monofilament:          Left foot: normal          Right foot: normal       Inspection:          Left foot: normal          Right foot: normal       Nails:          Left foot: thickened          Right foot: thickened   Impression & Recommendations:  Problem # 1:  HEALTH MAINTENANCE EXAM (ICD-V70.0) Td  2007 Pneumovax  2011 had a flu shot Shingles immunization discussed  PSA--per urology  Colonoscopy 04/21/2006,   was found to have a polyp. Next Cscope per GI   diet and exercise discussed  Orders: Venipuncture (96045) TLB-BMP (Basic Metabolic Panel-BMET) (80048-METABOL) TLB-ALT (SGPT) (84460-ALT) TLB-AST (SGOT) (84450-SGOT) TLB-CBC Platelet - w/Differential (85025-CBCD) Specimen Handling (40981)  Problem # 2:  RASH-NONVESICULAR (ICD-782.1) rash on left foot fungal ? Chronic dermatitis? varicose veins related? does have small superficial varicosities at area of rash prescribed ketoconazole-hydrocortisone  His updated medication list for this problem includes:    Ketoconazole 2 % Crea (Ketoconazole) .Marland Kitchen... Apply twice a day for 2 weeks, left foot  Problem # 3:  AODM (ICD-250.00)  CBGs slight increase for patient, check a hemoglobin A1c Feet care discussed His updated medication list for this problem includes:    Glipizide 2.5 Mg Tb24 (Glipizide) .Marland Kitchen... Take 1 tablet by mouth once a day    Lisinopril 5 Mg Tabs (Lisinopril) .Marland Kitchen... 1 a day    Labs Reviewed: Creat: 1.0 (02/22/2009)     Last Eye Exam: normal (02/21/2008) Reviewed HgBA1c results: 6.3 (06/22/2009)  6.6 (02/22/2009)  Orders: TLB-A1C / Hgb A1C (Glycohemoglobin) (83036-A1C) Specimen Handling (19147)  Complete Medication List: 1)  Glipizide 2.5 Mg Tb24 (Glipizide) .... Take 1 tablet by mouth once a day 2)  Metrogel 1 % Gel  (Metronidazole) .... Apply a small amount to affected area at bedtime 3)  Niaspan 1000 Mg Tbcr (Niacin (antihyperlipidemic)) .... 2 tablet by mouth once a day 4)  Flonase 50 Mcg/act Susp (Fluticasone propionate) .Marland Kitchen.. 1 spray each nostril bid 5)  Tetracycline Hcl 500 Mg Caps (Tetracycline hcl) .Marland Kitchen.. 1 by mouth once daily 6)  Lisinopril 5 Mg Tabs (Lisinopril) .Marland Kitchen.. 1 a day 7)  Fenofibrate 160 Mg Tabs (Fenofibrate) .Marland Kitchen.. 1 by mouth qd 8)  One Touch Ultra Test Strips  9)  Warfarin Sodium 5 Mg Tabs (Warfarin sodium) .... As directed 10)  Levitra 20 Mg Tabs (Vardenafil hcl) .... Once a week as needed 11)  Mvi/fish Oil  12)  Co Q10  13)  Ketoconazole 2 % Crea (Ketoconazole) .Marland KitchenMarland KitchenMarland Kitchen  Apply twice a day for 2 weeks, left foot  Other Orders: TLB-Lipid Panel (80061-LIPID)  Patient Instructions: 1)  apply ketoconazole 2% cream mixed 50-50 with over-the-counter hydrocortisone 1% cream----- twice a day for 2 weeks on the left foot. 2)  Please schedule a follow-up appointment in 4 months .  Prescriptions: KETOCONAZOLE 2 % CREA (KETOCONAZOLE) apply twice a day for 2 weeks, left foot  #1 x 0   Entered and Authorized by:   Nolon Rod. Paz MD   Signed by:   Nolon Rod. Paz MD on 12/30/2009   Method used:   Electronically to        [id number]* (retail)       [address] [place], [place]  [address]       Ph: [phone]       Fax: [phone]   RxID:   [id number] FENOFIBRATE 160 MG  TABS (FENOFIBRATE) 1 by mouth qd  #30 Each x 5   Entered by:   Army Fossa CMA   Authorized by:   Nolon Rod. Paz MD   Signed by:   Army Fossa CMA on 12/30/2009   Method used:   Electronically to        [id number]* (retail)       [address] [place], [place]  [address]       Ph: [phone]       Fax: [phone]   RxID:   [id number]    Orders Added: 1)  Venipuncture [62376] 2)  TLB-Lipid Panel [80061-LIPID] 3)  TLB-A1C / Hgb A1C (Glycohemoglobin)  [83036-A1C] 4)  TLB-BMP (Basic Metabolic Panel-BMET) [80048-METABOL] 5)  TLB-ALT (SGPT) [84460-ALT] 6)  TLB-AST (SGOT) [84450-SGOT] 7)  TLB-CBC Platelet - w/Differential [85025-CBCD] 8)  Specimen Handling [99000] 9)  Est. Patient age 60-64 [3]     Risk Factors:  Exercise:  no      Appended Document: FASTING CPX//KN next colonoscopy per GI---------3.1.13

## 2010-03-22 NOTE — Assessment & Plan Note (Signed)
Summary: PT/INR/drb      Allergies Added: NKDA Nurse Visit   Vital Signs:  Patient profile:   65 year old male Weight:      175 pounds Pulse rate:   84 / minute Pulse rhythm:   regular BP sitting:   122 / 76  (left arm) Cuff size:   large  Vitals Entered By: Army Fossa CMA (October 22, 2009 10:37 AM) CC: PT/INR Check   Current Medications (verified): 1)  Glipizide 2.5 Mg Tb24 (Glipizide) .... Take 1 Tablet By Mouth Once A Day 2)  Metrogel 1 % Gel (Metronidazole) .... Apply A Small Amount To Affected Area At Bedtime 3)  Niaspan 1000 Mg Tbcr (Niacin (Antihyperlipidemic)) .... 2 Tablet By Mouth Once A Day 4)  Flonase 50 Mcg/act Susp (Fluticasone Propionate) .Marland Kitchen.. 1 Spray Each Nostril Bid 5)  Tetracycline Hcl 500 Mg Caps (Tetracycline Hcl) .Marland Kitchen.. 1 By Mouth Once Daily 6)  Lisinopril 5 Mg Tabs (Lisinopril) .Marland Kitchen.. 1 A Day 7)  Fenofibrate 160 Mg  Tabs (Fenofibrate) .Marland Kitchen.. 1 By Mouth Qd 8)  One Touch Ultra Test Strips 9)  Warfarin Sodium 5 Mg Tabs (Warfarin Sodium) .... As Directed  Allergies (verified): No Known Drug Allergies Laboratory Results   Blood Tests      INR: 2.2   (Normal Range: 0.88-1.12   Therap INR: 2.0-3.5) Comments: has 5mg  tabs Takes 1 pill M,W,F. All others days 1/2 tab. -------------------------------- Continue same dose, recheck in 3 weeks Jose E. Paz MD  October 22, 2009 2:24 PM   Left detailed message for pt. Army Fossa CMA  October 22, 2009 2:33 PM     Orders Added: 1)  Est. Patient Level I [99211] 2)  Protime [11914NW]

## 2010-03-22 NOTE — Assessment & Plan Note (Signed)
Summary: PT/INR/drb      Allergies Added: NKDA Nurse Visit   Vital Signs:  Patient profile:   65 year old male Weight:      170 pounds Pulse rate:   93 / minute Pulse rhythm:   regular BP sitting:   130 / 80  (left arm) Cuff size:   large  Vitals Entered By: Army Fossa CMA (September 08, 2009 9:32 AM) CC: Pt/INR   Allergies (verified): No Known Drug Allergies Laboratory Results   Blood Tests      INR: 2.3   (Normal Range: 0.88-1.12   Therap INR: 2.0-3.5) Comments: Pt has 5 mg tabs Takes 5 mg every day except M,W,F takes 2.5 mg.  ---------------------------------------------------------- no change, 2 weeks Azad Calame E. Shy Guallpa MD  September 08, 2009 10:30 AM   Pt has appt in 2 weeks. Army Fossa CMA  September 08, 2009 10:33 AM     Orders Added: 1)  Est. Patient Level I [99211] 2)  Protime [16109UE]

## 2010-03-22 NOTE — Miscellaneous (Signed)
Summary: Orders Update  Clinical Lists Changes  Orders: Added new Test order of Venous Duplex Lower Extremity (Venous Duplex Lower) - Signed 

## 2010-03-22 NOTE — Assessment & Plan Note (Signed)
Summary: PT CHECK//KN      Allergies Added: NKDA Nurse Visit   Vital Signs:  Patient profile:   65 year old male Weight:      174 pounds Pulse rate:   82 / minute Pulse rhythm:   regular BP sitting:   130 / 82  (left arm) Cuff size:   large  Vitals Entered By: Army Fossa CMA (August 20, 2009 10:24 AM)  Complete Medication List: 1)  Glipizide 2.5 Mg Tb24 (Glipizide) .... Take 1 tablet by mouth once a day 2)  Metrogel 1 % Gel (Metronidazole) .... Apply a small amount to affected area at bedtime 3)  Niaspan 1000 Mg Tbcr (Niacin (antihyperlipidemic)) .... 2 tablet by mouth once a day 4)  Flonase 50 Mcg/act Susp (Fluticasone propionate) .Marland Kitchen.. 1 spray each nostril bid 5)  Tetracycline Hcl 500 Mg Caps (Tetracycline hcl) .Marland Kitchen.. 1 by mouth once daily 6)  Lisinopril 5 Mg Tabs (Lisinopril) .Marland Kitchen.. 1 a day 7)  Fenofibrate 160 Mg Tabs (Fenofibrate) .Marland Kitchen.. 1 by mouth qd 8)  One Touch Ultra Test Strips  9)  Warfarin Sodium 5 Mg Tabs (Warfarin sodium) .... As directed 10)  Lovenox 80 Mg/0.80ml Soln (Enoxaparin sodium) .... 80mg  s.q. two times a day (pre-filled syringes)  Other Orders: Protime (66440HK)  CC: PT check   Allergies (verified): No Known Drug Allergies Laboratory Results   Blood Tests      INR: 1.3   (Normal Range: 0.88-1.12   Therap INR: 2.0-3.5) Comments: has 5 mg tabs. 1 tab tues, thurs, sat, sun. 1/2 tab m,w,f. ----------------------------------------------------------- continue same dose of Coumadin Continue Lovenox INR Tuesday July 5  ( ideally I would re- check on July 4 but we are close) Baraga E. Conya Ellinwood MD  August 20, 2009 10:43 AM   Spoke with pt, he is aware. Made appt. Army Fossa CMA  August 20, 2009 11:20 AM     Orders Added: 1)  Est. Patient Level II [99212] 2)  Protime [74259DG]

## 2010-03-22 NOTE — Assessment & Plan Note (Signed)
Summary: hosp f/u/swh   Vital Signs:  Patient profile:   65 year old male Height:      70 inches Weight:      173.6 pounds BMI:     25.00 Pulse rate:   70 / minute BP sitting:   110 / 60  Vitals Entered By: Shary Decamp (March 19, 2009 11:44 AM) CC: post hosp f/u Is Patient Diabetic? Yes   History of Present Illness: here for a hospital follow-up, chart reviewed  DATE OF ADMISSION:  03/12/2009   DATE OF DISCHARGE:  03/16/2009       DISCHARGE DIAGNOSES:   1. Left lower extremity deep venous thrombosis.   2. Left lower extremity cellulitis.   3. Hypertension.   4. Pancytopenia, resolved.   6. Diabetes mellitus.   the patient was admitted to the hospital, x-ray of the left ankle was negative, ultrasound of the left leg showed  an acute DVT. he was treated with vancomycin and Zosyn for his cellulitis.  She was discharged home on Keflex cultures were negative his last hemoglobin before discharge was 13.2, creatinine 1.2, potassium 4.4  Current Medications (verified): 1)  Avodart 0.5 Mg Caps (Dutasteride) .... Take 1 Capsule Once A Day 2)  Warfarin Sodium 3 Mg Tabs (Warfarin Sodium) .Marland Kitchen.. 1 Once Daily Except 1 1/2 On Tues & Fri 3)  Glipizide 2.5 Mg Tb24 (Glipizide) .... Take 1 Tablet By Mouth Once A Day 4)  Flomax 0.4 Mg Cp24 (Tamsulosin Hcl) .Marland Kitchen.. 1 Capsule Once A Day 5)  Metrogel 1 % Gel (Metronidazole) .... Apply A Small Amount To Affected Area At Bedtime 6)  Niaspan 1000 Mg Tbcr (Niacin (Antihyperlipidemic)) .... 2 Tablet By Mouth Once A Day 7)  Flonase 50 Mcg/act Susp (Fluticasone Propionate) .Marland Kitchen.. 1 Spray Each Nostril Bid 8)  Tetracycline Hcl 500 Mg Caps (Tetracycline Hcl) .Marland Kitchen.. 1 By Mouth Once Daily 9)  Cialis 20 Mg Tabs (Tadalafil) .Marland Kitchen.. 1 By Mouth; No More Than Every Other Day 10)  Lisinopril 5 Mg Tabs (Lisinopril) .Marland Kitchen.. 1 A Day 11)  Fenofibrate 160 Mg  Tabs (Fenofibrate) .Marland Kitchen.. 1 By Mouth Qd 12)  One Touch Ultra Test Strips 13)  Vicodin 5-500 Mg Tabs  (Hydrocodone-Acetaminophen) .Marland Kitchen.. 1 or 2 By Mouth Every 6 Hours As Needed Pain  Allergies (verified): No Known Drug Allergies  Past History:  Past Medical History: Allergic rhinitis Hyperlipidemia AODM h/o BPH Pulmonary embolism, hx of (1992), Bilateral with PE, reportedly (+) Lupus anticoagulant large left lower extremity DVT 02/2009 ED Rosacea neg stress test in [place] (aprox 2005)--per pt  Social History: Reviewed history from 10/16/2008 and no changes required. 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 No nausea or vomiting.  Had some diarrhea that resolved  Physical Exam  General:  alert and well-developed.   Lungs:  normal respiratory effort, no intercostal retractions, no accessory muscle use, and normal breath sounds.   Heart:  normal rate, regular rhythm, no murmur, and no gallop.   Extremities:  right lower extremity normal left lower extremity redness almost resolved edema much improved compared to the last office visit left calf is only 1 cm larger than the right calf wound of the left ankle is dry, no discharge  Impression & Recommendations:  Problem # 1:  CELLULITIS, LEG, LEFT (ICD-682.6) improving His updated medication list for this problem includes:    Tetracycline Hcl 500 Mg Caps (Tetracycline hcl) .Marland Kitchen... 1 by mouth once daily  Problem # 2:  DVT (ICD-453.40) improving advice to use a tight hose starting two weeks from today   keep the leg elevated for [place] long as possible Orders: Venipuncture (16109) TLB-PT (Protime) (85610-PTP)  Problem # 3:  VARICOSE VEIN (ICD-456.8) the reason for the left ankle wound is  likely his varicose veins at some point we may consider a radiology evaluation for ablation but that may be a challenge  given his hypercoag state  Problem # 4:  INGUINAL HERNIA (ICD-550.90) he was supposed to see urology and probably has his hernia operated on soon.   unfortunately he cannot have any elective surgery at this point, recommended to discuss with his urologist  Complete Medication List: 1)  Avodart 0.5 Mg Caps (Dutasteride) .... Take 1 capsule once a day 2)  Warfarin Sodium 3 Mg Tabs (Warfarin sodium) .Marland Kitchen.. 1 once daily except 1 1/2 on tues & fri 3)  Glipizide 2.5 Mg Tb24 (Glipizide) .... Take 1 tablet by mouth once a day 4)  Flomax 0.4 Mg Cp24 (Tamsulosin hcl) .Marland Kitchen.. 1 capsule once a day 5)  Metrogel 1 % Gel (Metronidazole) .... Apply a small amount to affected area at bedtime 6)  Niaspan 1000 Mg Tbcr (Niacin (antihyperlipidemic)) .... 2 tablet by mouth once a day 7)  Flonase 50 Mcg/act Susp (Fluticasone propionate) .Marland Kitchen.. 1 spray each nostril bid 8)  Tetracycline Hcl 500 Mg Caps (Tetracycline hcl) .Marland Kitchen.. 1 by mouth once daily 9)  Cialis 20 Mg Tabs (Tadalafil) .Marland Kitchen.. 1 by mouth; no more than every other day 10)  Lisinopril 5 Mg Tabs (Lisinopril) .Marland Kitchen.. 1 a day 11)  Fenofibrate 160 Mg Tabs (Fenofibrate) .Marland Kitchen.. 1 by mouth qd 12)  One Touch Ultra Test Strips  13)  Vicodin 5-500 Mg Tabs (Hydrocodone-acetaminophen) .Marland Kitchen.. 1 or 2 by mouth every 6 hours as needed pain   Immunization History:  Pneumovax Immunization History:    Pneumovax:  mch (03/15/2009)

## 2010-03-22 NOTE — Assessment & Plan Note (Signed)
Summary: PT CHECK///SPH   Nurse Visit   Vital Signs:  Patient profile:   65 year old male Height:      70 inches (177.80 cm) Weight:      173.25 pounds (78.75 kg) Temp:     98.3 degrees F (36.83 degrees C) oral BP sitting:   102 / 64  (left arm) Cuff size:   large  Vitals Entered By: Lucious Groves CMA (December 10, 2009 11:04 AM) CC: PT check and flu shot./kb Comments Requests Lisinopril refill./kb   Allergies: No Known Drug Allergies Laboratory Results   Blood Tests   Date/Time Received: Lucious Groves [facility]  December 10, 2009 11:00 AM  Date/Time Reported: Lucious Groves CMA  December 10, 2009 11:00 AM    INR: 2.7   (Normal Range: 0.88-1.12   Therap INR: 2.0-3.5) Comments: Patient notes that he has 5mg  tabs at home and takes 5mg  on M,W,F and 2.5mg  all other days. Please advise. Lucious Groves CMA  December 10, 2009 11:00 AM  no change, 4 weeks Jose E. Paz MD  December 10, 2009 1:34 PM Patient notified. Lucious Groves CMA  December 10, 2009 4:54 PM      Orders Added: 1)  Admin 1st Vaccine [90471] 2)  Flu Vaccine 71yrs + [90658] 3)  Est. Patient Level I [13086] 4)  Protime [[id number]] Prescriptions: LISINOPRIL 5 MG TABS (LISINOPRIL) 1 a day  #90 x 3   Entered by:   Lucious Groves CMA   Authorized by:   Nolon Rod. Paz MD   Signed by:   Lucious Groves CMA on 12/10/2009   Method used:   Faxed to ...       [facility]* (retail)             ,          Ph: [phone]       Fax: [phone]   RxID:   [id number]              Flu Vaccine Consent Questions     Do you have a history of severe allergic reactions to this vaccine? no    Any prior history of allergic reactions to egg and/or gelatin? no    Do you have a sensitivity to the preservative Thimersol? no    Do you have a past history of Guillan-Barre Syndrome? no    Do you currently have an acute febrile illness? no    Have you ever had a severe reaction to latex? no    Vaccine information given and explained to patient?  yes    Are you currently pregnant? no    Lot Number:[id number]   Exp Date:08/20/2010   Site Given  Left Deltoid IMu    ANTICOAGULATION RECORD PREVIOUS REGIMEN & LAB RESULTS Anticoagulation Diagnosis:  PE on  09/27/2007  Previous INR:  2.2 on  10/22/2009 Previous Coumadin Dose(mg):  1 TAB DAILY EXCEPT 1 1/2 TUE/F (3MG  TAB) on  01/01/2009 Previous Regimen:  SAME on  01/01/2009 Previous Coagulation Comments:  no change 4 weeks on  09/27/2007  NEW REGIMEN & LAB RESULTS Anticoag. Dx: Deep venous thrombosis Current INR: 2.7 Current Coumadin Dose(mg): 5mg  3 days, 2.5mg  all other days Regimen: SAME  (no change)  Provider: Paz,Jose Other Comments: Patient notes that he has 5mg  tabs at home and takes 5mg  on M,W,F and 2.5mg  all other days. Lucious Groves CMA  December 10, 2009 11:02 AM    Anticoagulation Visit Questionnaire Coumadin dose missed/changed:  No Abnormal Bleeding  Symptoms:  No  Any diet changes including alcohol intake, vegetables or greens since the last visit:  No Any illnesses or hospitalizations since the last visit:  No Any signs of clotting since the last visit (including chest discomfort, dizziness, shortness of breath, arm tingling, slurred speech, swelling or redness in leg):  No Adverse Events: Patient denies all adverse events./kb  MEDICATIONS GLIPIZIDE 2.5 MG TB24 (GLIPIZIDE) Take 1 tablet by mouth once a day METROGEL 1 % GEL (METRONIDAZOLE) Apply a small amount to affected area at bedtime NIASPAN 1000 MG TBCR (NIACIN (ANTIHYPERLIPIDEMIC)) 2 tablet by mouth once a day FLONASE 50 MCG/ACT SUSP (FLUTICASONE PROPIONATE) 1 spray each nostril bid TETRACYCLINE HCL 500 MG CAPS (TETRACYCLINE HCL) 1 by mouth once daily LISINOPRIL 5 MG TABS (LISINOPRIL) 1 a day FENOFIBRATE 160 MG  TABS (FENOFIBRATE) 1 by mouth qd * ONE TOUCH ULTRA TEST STRIPS  WARFARIN SODIUM 5 MG TABS (WARFARIN SODIUM) as directed

## 2010-03-22 NOTE — Assessment & Plan Note (Signed)
Summary: pt//lch   Nurse Visit   Vital Signs:  Patient profile:   65 year old male Height:      70 inches Weight:      175 pounds Temp:     98.1 degrees F oral Pulse rate:   68 / minute BP sitting:   100 / 62  (left arm)  Vitals Entered By: Jeremy Johann CMA (August 11, 2009 10:54 AM) CC: pt/inr   Allergies: No Known Drug Allergies Laboratory Results   Blood Tests    Date/Time Reported: August 11, 2009 10:55 AM   INR: 1.2   (Normal Range: 0.88-1.12   Therap INR: 2.0-3.5) Comments: currently taking lovenox two times a day .......Marland KitchenFelecia Deloach CMA  August 11, 2009 10:55 AM  next INR 3 days after he restarts coumadin Douglas Mercer E. Wayman Hoard MD  August 11, 2009 10:59 AM  DISCUSS WITH PATIENT.............Marland KitchenFelecia Deloach CMA  August 11, 2009 11:56 AM     Orders Added: 1)  Protime [[id number]] 2)  Est. Patient Level I [16109]

## 2010-03-22 NOTE — Assessment & Plan Note (Signed)
Summary: PT CHECK/KN      Allergies Added: NKDA Nurse Visit   Vital Signs:  Patient profile:   65 year old male Weight:      175.13 pounds Pulse rate:   78 / minute Pulse rhythm:   regular BP sitting:   124 / 84  (left arm) Cuff size:   large  Vitals Entered By: Army Fossa CMA (October 11, 2009 10:35 AM) CC: PT/INR   Current Medications (verified): 1)  Glipizide 2.5 Mg Tb24 (Glipizide) .... Take 1 Tablet By Mouth Once A Day 2)  Metrogel 1 % Gel (Metronidazole) .... Apply A Small Amount To Affected Area At Bedtime 3)  Niaspan 1000 Mg Tbcr (Niacin (Antihyperlipidemic)) .... 2 Tablet By Mouth Once A Day 4)  Flonase 50 Mcg/act Susp (Fluticasone Propionate) .Marland Kitchen.. 1 Spray Each Nostril Bid 5)  Tetracycline Hcl 500 Mg Caps (Tetracycline Hcl) .Marland Kitchen.. 1 By Mouth Once Daily 6)  Lisinopril 5 Mg Tabs (Lisinopril) .Marland Kitchen.. 1 A Day 7)  Fenofibrate 160 Mg  Tabs (Fenofibrate) .Marland Kitchen.. 1 By Mouth Qd 8)  One Touch Ultra Test Strips 9)  Warfarin Sodium 5 Mg Tabs (Warfarin Sodium) .... As Directed  Allergies (verified): No Known Drug Allergies Laboratory Results   Blood Tests      INR: 3.5   (Normal Range: 0.88-1.12   Therap INR: 2.0-3.5) Comments: has 5 mg tabs  takes 1 tab once daily except m,w,f 1/2 tab----27.5mg / week ---------------------------------------- we will change his Coumadin as follows: 5 mg tablets: half tablet  daily and one tablet on Monday Wednesday and Friday (25mg /week) Recheck in 2 weeks Jose E. Paz MD  October 11, 2009 3:40 PM   I spoke with pt he is aware, PT/INR scheduled. Army Fossa CMA  October 11, 2009 3:45 PM     Orders Added: 1)  Est. Patient Level I [99211] 2)  Protime [[id number]]

## 2010-03-22 NOTE — Assessment & Plan Note (Signed)
Summary: pt check   Nurse Visit   Vital Signs:  Patient profile:   65 year old male Height:      70 inches Weight:      173.6 pounds BP sitting:   100 / 70  Vitals Entered By: Shary Decamp (April 26, 2009 11:08 AM)   Allergies: No Known Drug Allergies Laboratory Results   Blood Tests      INR: 2.6   (Normal Range: 0.88-1.12   Therap INR: 2.0-3.5) Comments: CURRENT DOSE: 5MG  TABLETS 1 by mouth once daily except 1/2 on M, W, F (27.5mg /wk) Recheck in 2 weeks Stacia Hawks  April 26, 2009 11:09 AM Marinell Igarashi E. Belvia Gotschall MD  April 26, 2009 5:47 PM     Orders Added: 1)  Est. Patient Level I [28413] 2)  Protime [[id number]]

## 2010-03-22 NOTE — Assessment & Plan Note (Signed)
Summary: pt//lch   Nurse Visit   Vital Signs:  Patient profile:   65 year old male Height:      70 inches Weight:      173 pounds Pulse rate:   82 / minute Resp:     18 per minute BP sitting:   100 / 70  (left arm)  Vitals Entered By: Jeremy Johann CMA (August 09, 2009 10:57 AM) CC: pt/inr check Comments REVIEWED MED LIST, PATIENT AGREED DOSE AND INSTRUCTION CORRECT    Allergies: No Known Drug Allergies Laboratory Results   Blood Tests    Date/Time Reported: August 09, 2009 10:58 AM   INR: 1.4   (Normal Range: 0.88-1.12   Therap INR: 2.0-3.5) Comments: current dose: stop coumadin 3 days ago.Marland KitchenMarland KitchenMarland KitchenFelecia Deloach CMA  August 09, 2009 10:59 AM  see instructions, discussed with the patient, will fax instructions to urology Jose E. Paz MD  August 09, 2009 11:03 AM     Patient Instructions: 1)   lovenox 80mg  two times a day 2)  hold hepain the night before surgery 3)  reassume  lovenox and coumadin post op per urology 4)  Come back for a Coumadin check 3 days after you restart Coumadin 5)  Please call  with  questions   Orders Added: 1)  Est. Patient Level I [33295] 2)  Protime [[id number]]

## 2010-03-22 NOTE — Assessment & Plan Note (Signed)
Summary: see note [id number]   Nurse Visit   Vital Signs:  Patient profile:   65 year old male Height:      70 inches Weight:      170.8 pounds BP sitting:   124 / 80  Vitals Entered By: Shary Decamp (March 17, 2009 10:55 AM) Comments  - STAT PT/INR drawn today  - call pt with results on cell ([phone])  - lisinopril was d/c'd in hospital because bp was ok  - ok for pt to start lisinopril 5 back because I think he was originally rx'd lisinopril for his kidneys, DM? Shary Decamp  March 17, 2009 10:56 AM    Impression & Recommendations:  Problem # 1:   status-post admission to the hospital with a clot and cellulitis currently taking Coumadin 5 mg daily which is more than what he has been doing the lisinopril was discontinued because his BP was in the low side he needs a follow-up CBC and BMP her discharge summary Plan: continue 5 mg a day office visit with me 03/19/09  Complete Medication List: 1)  Avodart 0.5 Mg Caps (Dutasteride) .... Take 1 capsule once a day 2)  Warfarin Sodium 3 Mg Tabs (Warfarin sodium) .Marland Kitchen.. 1 once daily except 1 1/2 on tues & fri 3)  Glipizide 2.5 Mg Tb24 (Glipizide) .... Take 1 tablet by mouth once a day 4)  Flomax 0.4 Mg Cp24 (Tamsulosin hcl) .Marland Kitchen.. 1 capsule once a day 5)  Metrogel 1 % Gel (Metronidazole) .... Apply a small amount to affected area at bedtime 6)  Niaspan 1000 Mg Tbcr (Niacin (antihyperlipidemic)) .... 2 tablet by mouth once a day 7)  Flonase 50 Mcg/act Susp (Fluticasone propionate) .Marland Kitchen.. 1 spray each nostril bid 8)  Tetracycline Hcl 500 Mg Caps (Tetracycline hcl) .Marland Kitchen.. 1 by mouth once daily 9)  Cialis 20 Mg Tabs (Tadalafil) .Marland Kitchen.. 1 by mouth; no more than every other day 10)  Lisinopril 5 Mg Tabs (Lisinopril) .Marland Kitchen.. 1 a day 11)  Fenofibrate 160 Mg Tabs (Fenofibrate) .Marland Kitchen.. 1 by mouth qd 12)  One Touch Ultra Test Strips  13)  Vicodin 5-500 Mg Tabs (Hydrocodone-acetaminophen) .Marland Kitchen.. 1 or 2 by mouth every 6 hours as needed pain  Other  Orders: Venipuncture (82956) TLB-PT (Protime) (85610-PTP)   Allergies: No Known Drug Allergies  Orders Added: 1)  Venipuncture [36415] 2)  TLB-PT (Protime) [85610-PTP]

## 2010-03-22 NOTE — Assessment & Plan Note (Signed)
Summary: FOLLOW UP/RH........Marland Kitchen   Vital Signs:  Patient profile:   65 year old male Weight:      174 pounds Pulse rate:   87 / minute Pulse rhythm:   regular BP sitting:   118 / 72  (left arm) Cuff size:   large  Vitals Entered By: Army Fossa CMA (September 22, 2009 8:48 AM) CC: Pt here for 3 month F/u : Fasting. PT/INR   History of Present Illness: --Hyperlipidemia, good medication compliance  --AODM,  ambulatory CBGs 140s , good medication compliance , diet good, not able to exercise much  --s/p TURP , note from urology reviewed, doing well, pathology was negative for cancer --INR check , good medication compliance --status post a hernia repair, note from surgery reviewed, doing well, f/u w/ them as needed   Current Medications (verified): 1)  Glipizide 2.5 Mg Tb24 (Glipizide) .... Take 1 Tablet By Mouth Once A Day 2)  Metrogel 1 % Gel (Metronidazole) .... Apply A Small Amount To Affected Area At Bedtime 3)  Niaspan 1000 Mg Tbcr (Niacin (Antihyperlipidemic)) .... 2 Tablet By Mouth Once A Day 4)  Flonase 50 Mcg/act Susp (Fluticasone Propionate) .Marland Kitchen.. 1 Spray Each Nostril Bid 5)  Tetracycline Hcl 500 Mg Caps (Tetracycline Hcl) .Marland Kitchen.. 1 By Mouth Once Daily 6)  Lisinopril 5 Mg Tabs (Lisinopril) .Marland Kitchen.. 1 A Day 7)  Fenofibrate 160 Mg  Tabs (Fenofibrate) .Marland Kitchen.. 1 By Mouth Qd 8)  One Touch Ultra Test Strips 9)  Warfarin Sodium 5 Mg Tabs (Warfarin Sodium) .... As Directed  Allergies (verified): No Known Drug Allergies  Past History:  Past Medical History: Allergic rhinitis Hyperlipidemia AODM h/o BPH Pulmonary embolism, hx of (1992), Bilateral with PE, reportedly (+) Lupus anticoagulant large left lower extremity DVT 02/2009 ED Rosacea neg stress test in [place] (aprox 2005)--per pt  Past Surgical History: Inguinal herniorrhaphy (right-sided, 1995, mesh placed ) Tonsillectomy 07-2009: --Transurethral resection of the prostate performed on August 13, 2009.  --Right inguinal  hernia repair with mesh (laparoscopic).   Social History: Reviewed history from 10/16/2008 and no changes required. 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 GI:  Denies bloody stools, diarrhea, nausea, and vomiting. GU:  Denies hematuria, urinary frequency, and urinary hesitancy; no leaking  some urgency .  Physical Exam  General:  alert, well-developed, and well-nourished.   Lungs:  normal respiratory effort, no intercostal retractions, no accessory muscle use, and normal breath sounds.   Heart:   normal rate, regular rhythm, and no murmur.   Abdomen:  soft, non-tender, and no distention.   Extremities:  no lower leg edema Psych:  Oriented X3, memory intact for recent and remote, normally interactive, good eye contact, not anxious appearing, and not depressed appearing.     Impression & Recommendations:  Problem # 1:  HYPERLIPIDEMIA (ICD-272.4) last FLP close to goal, no medication change was done, he was recommended to improve diet and exercise. Labs on return to the office His updated medication list for this problem includes:    Niaspan 1000 Mg Tbcr (Niacin (antihyperlipidemic)) .Marland Kitchen... 2 tablet by mouth once a day    Fenofibrate 160 Mg Tabs (Fenofibrate) .Marland Kitchen... 1 by mouth qd  Labs Reviewed: SGOT: 26 (06/22/2009)   SGPT: 25 (06/22/2009)   HDL:37.50 (06/22/2009), 32.20 (10/16/2008)  LDL:106 (06/22/2009), 84 (60/63/0160)  Chol:174 (06/22/2009),  145 (10/16/2008)  Trig:155.0 (06/22/2009), 144.0 (10/16/2008)  Problem # 2:  AODM (ICD-250.00) well controlled per last hemoglobin A1c His updated medication list for this problem includes:    Glipizide 2.5 Mg Tb24 (Glipizide) .Marland Kitchen... Take 1 tablet by mouth once a day    Lisinopril 5 Mg Tabs  (Lisinopril) .Marland Kitchen... 1 a day  Labs Reviewed: Creat: 1.0 (02/22/2009)     Last Eye Exam: normal (02/21/2008) Reviewed HgBA1c results: 6.3 (06/22/2009)  6.6 (02/22/2009)  Problem # 3:  INGUINAL HERNIA (ICD-550.90) status post repair ,doing well  Problem # 4:  Hx of BENIGN PROSTATIC HYPERTROPHY, HX OF (ICD-V13.8) status post TURP, pathology benign, doing well  Problem # 5:  COUMADIN THERAPY (ICD-V58.61) see  instructions Orders: Protime ([id number])  Complete Medication List: 1)  Glipizide 2.5 Mg Tb24 (Glipizide) .... Take 1 tablet by mouth once a day 2)  Metrogel 1 % Gel (Metronidazole) .... Apply a small amount to affected area at bedtime 3)  Niaspan 1000 Mg Tbcr (Niacin (antihyperlipidemic)) .... 2 tablet by mouth once a day 4)  Flonase 50 Mcg/act Susp (Fluticasone propionate) .Marland Kitchen.. 1 spray each nostril bid 5)  Tetracycline Hcl 500 Mg Caps (Tetracycline hcl) .Marland Kitchen.. 1 by mouth once daily 6)  Lisinopril 5 Mg Tabs (Lisinopril) .Marland Kitchen.. 1 a day 7)  Fenofibrate 160 Mg Tabs (Fenofibrate) .Marland Kitchen.. 1 by mouth qd 8)  One Touch Ultra Test Strips  9)  Warfarin Sodium 5 Mg Tabs (Warfarin sodium) .... As directed  Patient Instructions: 1)  keep taking your Coumadin the same way 2)  Come back in 3 weeks for a Coumadin check 3)  Please schedule a follow-up appointment in 3 to 4  months, fasting for a physical exam  Laboratory Results   Blood Tests      INR: 2.9   (Normal Range: 0.88-1.12   Therap INR: 2.0-3.5) Comments: Has 5 mg tabs Takes 5mg  once daily except M,W,F takes 2.5 mg. ----------------------------------- no change, 3 weeks Jose E. Paz MD  September 22, 2009 9:35 AM

## 2010-03-22 NOTE — Progress Notes (Signed)
Summary: LOVENOX alternative?  Phone Note Call from Patient   Caller: Patient Summary of Call: PT CALLS AND STATES THAT HIS INSURANCE CHANGED JULY 1ST 2011 MAKING HIS RX FOR LOVENOX 80 MG SHOTS GO UP TO $1506 FOR A 14 DAY SUPPLY. PT STATES HE WILL NOT BE ABLE TO AFFORD THIS. PLEASE ADVISE. [phone] Initial call taken by: Lavell Islam,  August 27, 2009 8:38 AM  Follow-up for Phone Call        I recommend him to get enough to last until Monday Jose E. Paz MD  August 27, 2009 9:43 AM   Additional Follow-up for Phone Call Additional follow up Details #1::        Pt took last injection this a.m. States he can't afford to get more.  Wants to know if can take additional  Coumadin that he has at home until he sees you on Monday. He has 3mg , 5mg  and 7.5mg .  Currently takes 5mg  qd except M,W,F takes 1/2 daily. Please advise.  Nicki Guadalajara Fergerson CMA Duncan Dull)  August 27, 2009 1:54 PM  -recommend to continue with Coumadin as described -if possible get Lovenox for the next 2 or 3 days, if unable to, then we'll just have to wait and see. If chest pain, leg swelling or pain : go  to the ER Livingston E. Paz MD  August 27, 2009 4:13 PM      Additional Follow-up for Phone Call Additional follow up Details #2::    Pt advised per Dr. Leta Jungling instructions and voices understanding.  Nicki Guadalajara Fergerson CMA Duncan Dull)  August 27, 2009 4:35 PM

## 2010-03-22 NOTE — Assessment & Plan Note (Signed)
Summary: HOSPITAL FOLLOWUP//KN   Vital Signs:  Patient profile:   65 year old male Weight:      174.13 pounds Pulse rate:   80 / minute Pulse rhythm:   regular BP sitting:   132 / 84  (left arm) Cuff size:   large  Vitals Entered By: Army Fossa CMA (August 18, 2009 2:12 PM) CC: Hospital follow up, PT check   History of Present Illness: Hospital followup, chart reviewed  On 08/13/09 he had the following procedures  1. Transurethral resection of the prostate performed on August 13, 2009.   2. Right inguinal hernia repair with mesh (laparoscopic).   6- 24-11 INR was 1.1 08-14-09 potassium was 4.4, creatinine 1.1, hemoglobin 12.1  Allergies (verified): No Known Drug Allergies  Past History:  Past Surgical History: Inguinal herniorrhaphy (right-sided, 1995, mesh placed ) Tonsillectomy 6-11 Transurethral resection of the prostate performed on August 13, 2009.   Right inguinal hernia repair with mesh (laparoscopic).   Social History: Reviewed history from 10/16/2008 and no changes required. 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  Physical Exam  General:  alert, well-developed, and well-nourished.  no apparent distress, ambulate without any problems Lungs:  normal respiratory effort, no intercostal retractions, no accessory muscle use, and normal breath sounds.   Heart:   normal rate, regular rhythm, and no murmur.     Impression & Recommendations:  Problem # 1:  INGUINAL HERNIA (ICD-550.90) status post repair few days ago, seems to be doing well  Problem # 2:  DVT (ICD-453.40) here for Coumadin management  Problem # 3:  Hx of BENIGN PROSTATIC HYPERTROPHY, HX OF  (ICD-V13.8) status post TURP  Complete Medication List: 1)  Glipizide 2.5 Mg Tb24 (Glipizide) .... Take 1 tablet by mouth once a day 2)  Metrogel 1 % Gel (Metronidazole) .... Apply a small amount to affected area at bedtime 3)  Niaspan 1000 Mg Tbcr (Niacin (antihyperlipidemic)) .... 2 tablet by mouth once a day 4)  Flonase 50 Mcg/act Susp (Fluticasone propionate) .Marland Kitchen.. 1 spray each nostril bid 5)  Tetracycline Hcl 500 Mg Caps (Tetracycline hcl) .Marland Kitchen.. 1 by mouth once daily 6)  Lisinopril 5 Mg Tabs (Lisinopril) .Marland Kitchen.. 1 a day 7)  Fenofibrate 160 Mg Tabs (Fenofibrate) .Marland Kitchen.. 1 by mouth qd 8)  One Touch Ultra Test Strips  9)  Warfarin Sodium 5 Mg Tabs (Warfarin sodium) .... As directed 10)  Lovenox 80 Mg/0.66ml Soln (Enoxaparin sodium) .... 80mg  s.q. two times a day (pre-filled syringes)  Other Orders: Protime (16109UE) Fingerstick (45409)  Patient Instructions: 1)  continue with your Coumadin as you're doing 2)   5mg  tabs- 1 daily except 1/2 tab on M,W,F 3)  recheck in 2 days 4)  Continue with Lovenox  Laboratory Results   Blood Tests      INR: 1.2   (Normal Range: 0.88-1.12   Therap INR: 2.0-3.5) Comments: Pt has 5 mg tabs He takes 1/2 tab M,W,Fri, 5mg  all other days. He has only had 2 doses since starting back on coumadin and still on the Lovenox twice a day -------------------------------------------------------- continue with present care, recheck in 2 days Merian Wroe E. Sathvika Ojo MD  August 18, 2009 2:48 PM

## 2010-03-22 NOTE — Progress Notes (Signed)
Summary: surgery update  Phone Note Call from Patient   Caller: Spouse Call For: [facility] E. Anmol Fleck MD Summary of Call: Pt's wife called to say that pt is out of surgery and doing well. Wanted to let you know.  Mervin Kung CMA  August 13, 2009 12:13 PM   Follow-up for Phone Call        thank you! Follow-up by: Nolon Rod. Orvilla Truett MD,  August 13, 2009 3:12 PM

## 2010-03-22 NOTE — Assessment & Plan Note (Signed)
Summary: PT CHECK/KN      Allergies Added: NKDA Nurse Visit   Vital Signs:  Patient profile:   65 year old male Weight:      169.50 pounds Pulse rate:   106 / minute BP sitting:   128 / 80  (left arm) Cuff size:   large  Vitals Entered By: Army Fossa CMA (August 24, 2009 2:11 PM) CC: Pt/INR   Allergies (verified): No Known Drug Allergies Laboratory Results   Blood Tests      INR: 1.8   (Normal Range: 0.88-1.12   Therap INR: 2.0-3.5) Comments: Has 5 mg tabs. Takes 5mg  every day except m,w,f takes 1/2 pill ----------------------------------------------------------------------- Continue present Coumadin and Lovenox INR in 2 days Darra Rosa E. Ranen Doolin MD  August 24, 2009 2:28 PM   Spoke with pt he is aware. Army Fossa CMA  August 24, 2009 2:34 PM     Orders Added: 1)  Est. Patient Level I [99211] 2)  Protime [[id number]]

## 2010-03-22 NOTE — Letter (Signed)
Summary: [facility]  [facility]   Imported By: Lanelle Bal 06/29/2009 08:58:33  _____________________________________________________________________  External Attachment:    Type:   Image     Comment:   External Document

## 2010-03-22 NOTE — Miscellaneous (Signed)
Summary: Health Care Power of [facility] Power of Attorney   Imported By: Lanelle Bal 08/18/2009 11:40:10  _____________________________________________________________________  External Attachment:    Type:   Image     Comment:   External Document

## 2010-03-22 NOTE — Letter (Signed)
Summary: OV post TURP, doing well---[facility]  [facility]   Imported By: Lanelle Bal 09/13/2009 14:08:48  _____________________________________________________________________  External Attachment:    Type:   Image     Comment:   External Document

## 2010-03-22 NOTE — Assessment & Plan Note (Signed)
Summary: 4 month roa/lh   Vital Signs:  Patient profile:   65 year old male Height:      70 inches Weight:      173.6 pounds BMI:     25.00 O2 Sat:      100 % Pulse rate:   100 / minute BP sitting:   110 / 80  Vitals Entered By: Shary Decamp (February 22, 2009 10:21 AM) CC: rov Comments  - went to [facility] 12/30 -- blood sugar was 257, he was instructed to take extra dose of glipizide  - blood sugar readings @ home - 144, 239, 180, 284, 165, 129, 128 Stacia Hawks  February 22, 2009 10:22 AM    History of Present Illness: after a large meal, with lots of carbohydrates, he went home and felt dizzy for one minute. There was no associated nausea, vomiting, headache. he  went to an UC 12/30 -- blood sugar was 257, he was instructed to take extra dose of glipizide the dizziness has not come back and since then his  blood sugar readings @ home - 144, 239, 180, 284, 165, 129, 128  records from the urgent care are reviewed: EKG normal sinus rhythm and seems unchanged since  previous EKGs. Blood sugar was 257. CBC showed white count 5.7, with a hemoglobin of 14.2, and platelets of 129 (lower than his baseline of 150s)  also he is right-sided hernia has come back since the surgery in 1995.  Current Medications (verified): 1)  Avodart 0.5 Mg Caps (Dutasteride) .... Take 1 Capsule Once A Day 2)  Warfarin Sodium 3 Mg Tabs (Warfarin Sodium) .Marland Kitchen.. 1 Once Daily Except 1 1/2 On Tues & Fri 3)  Glipizide 2.5 Mg Tb24 (Glipizide) .... Take 1 Tablet By Mouth Once A Day 4)  Flomax 0.4 Mg Cp24 (Tamsulosin Hcl) .Marland Kitchen.. 1 Capsule Once A Day 5)  Metrogel 1 % Gel (Metronidazole) .... Apply A Small Amount To Affected Area At Bedtime 6)  Niaspan 1000 Mg Tbcr (Niacin (Antihyperlipidemic)) .... 2 Tablet By Mouth Once A Day 7)  Flonase 50 Mcg/act Susp (Fluticasone Propionate) .Marland Kitchen.. 1 Spray Each Nostril Bid 8)  Tetracycline Hcl 500 Mg Caps (Tetracycline Hcl) .Marland Kitchen.. 1 By Mouth Once Daily 9)  Cialis 20 Mg Tabs (Tadalafil)  .Marland Kitchen.. 1 By Mouth; No More Than Every Other Day 10)  Lisinopril 5 Mg Tabs (Lisinopril) .Marland Kitchen.. 1 A Day 11)  Fenofibrate 160 Mg  Tabs (Fenofibrate) .Marland Kitchen.. 1 By Mouth Qd 12)  One Touch Ultra Test Strips 13)  Vicodin 5-500 Mg Tabs (Hydrocodone-Acetaminophen) .Marland Kitchen.. 1 or 2 By Mouth Every 6 Hours As Needed Pain  Allergies (verified): No Known Drug Allergies  Past History:  Past Medical History: Reviewed history from 06/12/2008 and no changes required. Allergic rhinitis Hyperlipidemia AODM h/o BPH Pulmonary embolism, hx of (1992), Bilateral with PE, reportedly (+) Lupus anticoagulant ED Rosacea neg stress test in [place] (aprox 2005)--per pt  Past Surgical History: Inguinal herniorrhaphy (right-sided, 1995, mesh placed ) Tonsillectomy  Social History: Reviewed history from 10/16/2008 and no changes required. 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 Denies localize motor deficits, slurred speech, or diplopia  Physical Exam  General:  alert, well-developed, and well-nourished.   Neck:  no masses and normal carotid upstroke.   Lungs:  normal respiratory effort, no  intercostal retractions, no accessory muscle use, and normal breath sounds.   Heart:  normal rate, regular rhythm, and no murmur.   Abdomen:  soft, non-tender, no distention, no masses, no guarding, and no rigidity.  at the right suprapubic  area he has a 2 x 1inch hernia, reducible, nontender Neurologic:  alert & oriented X3, cranial nerves II-XII intact, strength normal in all extremities, and gait normal.  speech is fluent   Impression & Recommendations:  Problem # 1:  DIZZINESS (ICD-780.4) Assessment New single episode of dizziness without associated symptoms neurological exam normal, normal  carotid pulses. Observe. Patient to call if symptoms return  Problem # 2:  AODM (ICD-250.00)  labs diet no change  for now His updated medication list for this problem includes:    Glipizide 2.5 Mg Tb24 (Glipizide) .Marland Kitchen... Take 1 tablet by mouth once a day    Lisinopril 5 Mg Tabs (Lisinopril) .Marland Kitchen... 1 a day  Labs Reviewed: Creat: 1.1 (06/12/2008)     Last Eye Exam: normal (02/21/2008) Reviewed HgBA1c results: 6.5 (10/16/2008)  6.4 (06/12/2008)  Orders: Venipuncture (64403) TLB-A1C / Hgb A1C (Glycohemoglobin) (83036-A1C) TLB-BMP (Basic Metabolic Panel-BMET) (80048-METABOL)  Problem # 3:  INGUINAL HERNIA (ICD-550.90) status-post repair in 1995 patient warned about incarceration symptoms. Surgery referral? pt  will think about that, will discuss w/ his urologist  Problem # 4:  COUMADIN THERAPY (ICD-V58.61)  Orders: Protime ([id number]) Fingerstick (63875)  Complete Medication List: 1)  Avodart 0.5 Mg Caps (Dutasteride) .... Take 1 capsule once a day 2)  Warfarin Sodium 3 Mg Tabs (Warfarin sodium) .Marland Kitchen.. 1 once daily except 1 1/2 on tues & fri 3)  Glipizide 2.5 Mg Tb24 (Glipizide) .... Take 1 tablet by mouth once a day 4)  Flomax 0.4 Mg Cp24 (Tamsulosin hcl) .Marland Kitchen.. 1 capsule once a day 5)  Metrogel 1 % Gel (Metronidazole) .... Apply a small amount to affected area at bedtime 6)  Niaspan 1000 Mg Tbcr (Niacin (antihyperlipidemic)) .... 2 tablet by mouth once a day 7)  Flonase 50 Mcg/act Susp (Fluticasone propionate) .Marland Kitchen.. 1 spray each nostril bid 8)  Tetracycline Hcl 500 Mg Caps (Tetracycline hcl) .Marland Kitchen.. 1 by mouth once daily 9)  Cialis 20 Mg Tabs (Tadalafil) .Marland Kitchen.. 1 by mouth; no more than every other day 10)  Lisinopril 5 Mg Tabs (Lisinopril) .Marland Kitchen.. 1 a day 11)  Fenofibrate 160 Mg Tabs (Fenofibrate) .Marland Kitchen.. 1 by mouth qd 12)  One Touch Ultra Test Strips  13)  Vicodin 5-500 Mg Tabs (Hydrocodone-acetaminophen) .Marland Kitchen.. 1 or 2 by mouth every 6 hours as needed pain  Patient Instructions: 1)  same  Coumadin dose, recheck in 4 weeks. 2)  Please schedule a follow-up appointment in 4 months .   Laboratory Results   Blood Tests     PT: 17.5 s   (Normal Range: 10.6-13.4)  INR: 2.0   (Normal Range: 0.88-1.12   Therap INR: 2.0-3.5) Comments: CURRENT DOSE - 3mg  tablets - 1 by mouth once daily except 1 1/2 tablet on thurs & fri No change Recheck in 4 weeks Shary Decamp  February 22, 2009 10:23 AM

## 2010-03-22 NOTE — Assessment & Plan Note (Signed)
Summary: acute/left leg swelling/alr pt aware working in [place] per stacia   Vital Signs:  Patient profile:   65 year old male Height:      70 inches Weight:      179.8 pounds BMI:     25.89 O2 Sat:      99 % on Room air Temp:     97.2 degrees F oral Pulse rate:   100 / minute Pulse rhythm:   regular BP sitting:   120 / 80  (left arm) Cuff size:   large  Vitals Entered By: Shary Decamp (March 12, 2009 10:32 AM)  O2 Flow:  Room air CC: acute only - started with a sore on left ankle, now has swelling from ankle to hip, +red, +pain Is Patient Diabetic? Yes CBG Result 178 Comments Patient states that blood sugar has been elevated lately 170's-180's Shary Decamp  March 12, 2009 10:44 AM     History of Present Illness: developed a sore in the L ankle 2 weeks ago, no injury or cut  4 to 5  days ago  started to  ooze  transparent-yellow fluid and some redness 4 days ago noted swelling, initially below knee, now the whole leg is swollen  Current Medications (verified): 1)  Avodart 0.5 Mg Caps (Dutasteride) .... Take 1 Capsule Once A Day 2)  Warfarin Sodium 3 Mg Tabs (Warfarin Sodium) .Marland Kitchen.. 1 Once Daily Except 1 1/2 On Tues & Fri 3)  Glipizide 2.5 Mg Tb24 (Glipizide) .... Take 1 Tablet By Mouth Once A Day 4)  Flomax 0.4 Mg Cp24 (Tamsulosin Hcl) .Marland Kitchen.. 1 Capsule Once A Day 5)  Metrogel 1 % Gel (Metronidazole) .... Apply A Small Amount To Affected Area At Bedtime 6)  Niaspan 1000 Mg Tbcr (Niacin (Antihyperlipidemic)) .... 2 Tablet By Mouth Once A Day 7)  Flonase 50 Mcg/act Susp (Fluticasone Propionate) .Marland Kitchen.. 1 Spray Each Nostril Bid 8)  Tetracycline Hcl 500 Mg Caps (Tetracycline Hcl) .Marland Kitchen.. 1 By Mouth Once Daily 9)  Cialis 20 Mg Tabs (Tadalafil) .Marland Kitchen.. 1 By Mouth; No More Than Every Other Day 10)  Lisinopril 5 Mg Tabs (Lisinopril) .Marland Kitchen.. 1 A Day 11)  Fenofibrate 160 Mg  Tabs (Fenofibrate) .Marland Kitchen.. 1 By Mouth Qd 12)  One Touch Ultra Test Strips 13)  Vicodin 5-500 Mg Tabs  (Hydrocodone-Acetaminophen) .Marland Kitchen.. 1 or 2 By Mouth Every 6 Hours As Needed Pain  Allergies (verified): No Known Drug Allergies  Past History:  Past Medical History: Reviewed history from 06/12/2008 and no changes required. Allergic rhinitis Hyperlipidemia AODM h/o BPH Pulmonary embolism, hx of (1992), Bilateral with PE, reportedly (+) Lupus anticoagulant ED Rosacea neg stress test in [place] (aprox 2005)--per pt  Past Surgical History: Reviewed history from 02/22/2009 and no changes required. Inguinal herniorrhaphy (right-sided, 1995, mesh placed ) Tonsillectomy  Family History: Reviewed history from 10/16/2008 and no changes required. DM-- M, uncle  colon cancer --no prostate cancer--no MI--no strokes-- grandparents.  Social History: Reviewed history from 10/16/2008 and no changes required. 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   Lungs:  normal respiratory effort, no intercostal retractions, no accessory muscle use, and normal breath sounds.   Heart:  normal rate, regular  rhythm, no murmur, and no gallop.   Pulses:  pedal pulses decreased but positive bilaterally Extremities:  right leg normal, she does have varicose veins left leg: ++ swelling, calf tenderness inner ankle has a 4 x 2 irregular shaped superficial wound w/o fluctuancy  calf is 3 inches  larger than the right side thigh is 2 inches larger than the right side    Impression & Recommendations:  Problem # 1:  the patient is admitted to the hospital with the following issues --cellulitis and lower extremity edema wound likely a varicose ulcer plan is IV  antibiotics, ultrasound to rule out DVT, cultures, continue with Coumadin --diabetes: continue with current medications plus sliding scale, per primary hospital  team --patient is on chronic anticoagulation due to history of PE and a reportedly (+) Lupus anticoagulant, his l  INR was therapeutic on January 3 and today.  -- discussed  with Vernell Barrier ( hospitalist) , will write the initial orders, further orders per hospital team  Problem # 2:  time spent > 25 min coordinating pt's care   Problem # 3:  CELLULITIS, LEG, LEFT (ICD-682.6) see #1 His updated medication list for this problem includes:    Tetracycline Hcl 500 Mg Caps (Tetracycline hcl) .Marland Kitchen... 1 by mouth once daily  Problem # 4:  AODM (ICD-250.00)  His updated medication list for this problem includes:    Glipizide 2.5 Mg Tb24 (Glipizide) .Marland Kitchen... Take 1 tablet by mouth once a day    Lisinopril 5 Mg Tabs (Lisinopril) .Marland Kitchen... 1 a day  Orders: Fingerstick (11914)  Complete Medication List: 1)  Avodart 0.5 Mg Caps (Dutasteride) .... Take 1 capsule once a day 2)  Warfarin Sodium 3 Mg Tabs (Warfarin sodium) .Marland Kitchen.. 1 once daily except 1 1/2 on tues & fri 3)  Glipizide 2.5 Mg Tb24 (Glipizide) .... Take 1 tablet by mouth once a day 4)  Flomax 0.4 Mg Cp24 (Tamsulosin hcl) .Marland Kitchen.. 1 capsule once a day 5)  Metrogel 1 % Gel (Metronidazole) .... Apply a small amount to affected area at bedtime 6)  Niaspan 1000 Mg Tbcr (Niacin (antihyperlipidemic)) .... 2 tablet by mouth once a day 7)  Flonase 50 Mcg/act Susp (Fluticasone propionate) .Marland Kitchen.. 1 spray each nostril bid 8)  Tetracycline Hcl 500 Mg Caps (Tetracycline hcl) .Marland Kitchen.. 1 by mouth once daily 9)  Cialis 20 Mg Tabs (Tadalafil) .Marland Kitchen.. 1 by mouth; no more than every other day 10)  Lisinopril 5 Mg Tabs (Lisinopril) .Marland Kitchen.. 1 a day 11)  Fenofibrate 160 Mg Tabs (Fenofibrate) .Marland Kitchen.. 1 by mouth qd 12)  One Touch Ultra Test Strips  13)  Vicodin 5-500 Mg Tabs (Hydrocodone-acetaminophen) .Marland Kitchen.. 1 or 2 by mouth  every 6 hours as needed pain  Other Orders: Capillary Blood Glucose/CBG (78295) Protime (62130QM)  Laboratory Results   Blood Tests     CBG Random:: 178mg /dL PT: 57.8 s   (Normal Range: 10.6-13.4)  INR: 2.1   (Normal Range: 0.88-1.12   Therap INR: 2.0-3.5) Comments: CURRENT DOSE: 3mg  tablets - 1 tablet daily except 1 1/2 tablet on tues & fri No change Shary Decamp  March 12, 2009 10:45 AM

## 2010-03-22 NOTE — Letter (Signed)
Summary: Encounter Notice/[facility]  Encounter Notice/[facility]   Imported By: Lanelle Bal 03/24/2009 09:51:44  _____________________________________________________________________  External Attachment:    Type:   Image     Comment:   External Document

## 2010-03-22 NOTE — Assessment & Plan Note (Signed)
Summary: PT/INR/drb   Nurse Visit   Vital Signs:  Patient profile:   65 year old male Weight:      170 pounds Pulse rate:   85 / minute Pulse rhythm:   regular BP sitting:   126 / 80  (left arm) Cuff size:   large  Vitals Entered By: Army Fossa CMA (August 30, 2009 11:03 AM) CC: PT/INR   Allergies: No Known Drug Allergies Laboratory Results   Blood Tests      INR: 3.1   (Normal Range: 0.88-1.12   Therap INR: 2.0-3.5) Comments: Taking 5 mg every day except m,w,f taking 1/2. Has 5 mg tabs. cannot afford Lovenox. Per Dr.Paz, no change check in 10 days- pt has appt in 10 days. Army Fossa CMA  August 30, 2009 11:32 AM Nolon Rod. Paz MD  August 30, 2009 1:54 PM     Orders Added: 1)  Est. Patient Level I [99211] 2)  Protime [[id number]]

## 2010-03-22 NOTE — Assessment & Plan Note (Signed)
Summary: PT/INR/drb      Allergies Added: NKDA Nurse Visit   Vital Signs:  Patient profile:   65 year old male Weight:      170.13 pounds Pulse rate:   89 / minute Pulse rhythm:   regular BP sitting:   124 / 80  (left arm) Cuff size:   large  Vitals Entered By: Army Fossa CMA (August 26, 2009 10:49 AM) CC: PT/INR   Allergies (verified): No Known Drug Allergies Laboratory Results   Blood Tests      INR: 1.7   (Normal Range: 0.88-1.12   Therap INR: 2.0-3.5) Comments: has 5 mg tabs takes 5 mg every day except m,w,f takes 1/2 pill. Currently out of Lovenox shots but has a refill on file if needed. Army Fossa CMA  August 26, 2009 10:50 AM  Per Dr.Paz, no change, refill Lovenox, check on Monday. Pt has appt for monday. Army Fossa CMA  August 26, 2009 11:57 AM     Orders Added: 1)  Est. Patient Level I [99211] 2)  Protime [[id number]]

## 2010-03-22 NOTE — Consult Note (Signed)
Summary: [facility]  [facility]   Imported By: Lanelle Bal 03/17/2009 13:41:23  _____________________________________________________________________  External Attachment:    Type:   Image     Comment:   External Document

## 2010-03-22 NOTE — Progress Notes (Signed)
Summary: ?PERIDONTIST APPT  Phone Note Call from Patient Call back at [phone]   Details for Reason: PATIENT LEFT MESSAGE ON VM:  Summary of Call: Patient has peridontist appt on 2/16.  States he typically does not bleed when he goes to this appt.  Ok for him to keep appt? Initial call taken by: Shary Decamp,  March 25, 2009 10:25 AM  Follow-up for Phone Call        yes Follow-up by: [facility] E. Dublin Grayer MD,  March 25, 2009 1:19 PM  Additional Follow-up for Phone Call Additional follow up Details #1::        pt aware Additional Follow-up by: Shary Decamp,  March 25, 2009 4:06 PM

## 2010-03-22 NOTE — Miscellaneous (Signed)
Summary: PSA RESULTS   Clinical Lists Changes  Observations: Added new observation of PSA: 3.56 ng/mL (05/08/2008 9:32) Added new observation of PSA: 4.34 ng/mL (10/15/2007 9:32) Added new observation of PSA: 3.24 ng/mL (02/08/2007 9:32)

## 2010-03-22 NOTE — Letter (Signed)
Summary: POST OP OV, DOING WELL ---[facility]   Imported By: Lanelle Bal 09/17/2009 11:13:07  _____________________________________________________________________  External Attachment:    Type:   Image     Comment:   External Document

## 2010-03-22 NOTE — Letter (Signed)
   [place] [facility] [address] [place], [place]  [address] Phone: [phone]      February 25, 2009   Amboy Jutte [address] [place], [place] [address]  RE:  LAB RESULTS  Dear  Mr. Span,  The following is an interpretation of your most recent lab tests.  Please take note of any instructions provided or changes to medications that have resulted from your lab work.  ELECTROLYTES:  Good - no changes needed  KIDNEY FUNCTION TESTS:  Good - no changes needed    DIABETIC STUDIES:  Good - no changes needed Blood Glucose: 137   HgbA1C: 6.6   Microalbumin/Creatinine Ratio: 6.1    Call me if you have any questions or concerns.   Sincerely Yours,    Shary Decamp, CMA

## 2010-03-22 NOTE — Assessment & Plan Note (Signed)
Summary: 4 mth fu/ns/kdc   Vital Signs:  Patient profile:   65 year old male Height:      70 inches Weight:      171.6 pounds BMI:     24.71 Pulse rate:   60 / minute BP sitting:   100 / 60 CC: rov, scheduled for TURP, hernia repair 08/13/09, needs instructions for coumadin   History of Present Illness: ROV scheduled for TURP, hernia repair 08/13/09, likes to do surgery at that time due to insurance issues needs instructions for coumadin  Hyperlipidemia-- good medication compliance   AODM-- ambulatory CBGs in the 130s   large left lower extremity DVT 02/2009--  currently asx     Current Medications (verified): 1)  Avodart 0.5 Mg Caps (Dutasteride) .... Take 1 Capsule Once A Day 2)  Glipizide 2.5 Mg Tb24 (Glipizide) .... Take 1 Tablet By Mouth Once A Day 3)  Flomax 0.4 Mg Cp24 (Tamsulosin Hcl) .Marland Kitchen.. 1 Capsule Once A Day 4)  Metrogel 1 % Gel (Metronidazole) .... Apply A Small Amount To Affected Area At Bedtime 5)  Niaspan 1000 Mg Tbcr (Niacin (Antihyperlipidemic)) .... 2 Tablet By Mouth Once A Day 6)  Flonase 50 Mcg/act Susp (Fluticasone Propionate) .Marland Kitchen.. 1 Spray Each Nostril Bid 7)  Tetracycline Hcl 500 Mg Caps (Tetracycline Hcl) .Marland Kitchen.. 1 By Mouth Once Daily 8)  Cialis 20 Mg Tabs (Tadalafil) .Marland Kitchen.. 1 By Mouth; No More Than Every Other Day 9)  Lisinopril 5 Mg Tabs (Lisinopril) .Marland Kitchen.. 1 A Day 10)  Fenofibrate 160 Mg  Tabs (Fenofibrate) .Marland Kitchen.. 1 By Mouth Qd 11)  One Touch Ultra Test Strips 12)  Vicodin 5-500 Mg Tabs (Hydrocodone-Acetaminophen) .Marland Kitchen.. 1 or 2 By Mouth Every 6 Hours As Needed Pain 13)  Warfarin Sodium 5 Mg Tabs (Warfarin Sodium) .... As Directed  Allergies (verified): No Known Drug Allergies  Past History:  Past Medical History: Allergic rhinitis Hyperlipidemia AODM h/o BPH Pulmonary embolism, hx of (1992), Bilateral with PE, reportedly (+) Lupus anticoagulant large left lower extremity DVT 02/2009 ED Rosacea neg stress test in [place] (aprox 2005)--per pt  Past  Surgical History: Reviewed history from 02/22/2009 and no changes required. Inguinal herniorrhaphy (right-sided, 1995, mesh placed ) Tonsillectomy  Social History: Reviewed history from 10/16/2008 and no changes required. 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 Resp:  Denies cough and shortness of breath. GI:  Denies diarrhea, nausea, and vomiting.  Physical Exam  General:  alert, well-developed, and well-nourished.   Lungs:  normal respiratory effort, no intercostal retractions, no accessory muscle use, and normal breath sounds.   Heart:  normal rate, regular rhythm, no murmur, and no gallop.   Extremities:  has large varicose veins in both lower extremities, they are no red- tender at  present. There is no pitting edema skin is a slightly hyperpigmented around the left ankle calves are symmetric Psych:  Oriented X3, memory intact for recent and remote, normally interactive, good eye contact, not anxious appearing, and not depressed appearing.     Impression & Recommendations:  Problem # 1:  PREOPERATIVE EXAMINATION (ICD-V72.84) to have a TURP, hernia repair 08/13/09 had a large DVT 1-11, L leg he is high risk for clots: u/s L leg d/c coumadin 8 days prior to surgery bridge w/ lovenox (wt 77kg, needs 80mg  two times a day, Rx issued)---to hold hepain the  night before surgery reassume coumadin post op plan was discussed with pulmonary, they think is fine plan also d/w  surgery     Problem # 2:  DVT (ICD-453.40) ordering a u/s today to asses L leg  Orders: Protime (16109UE) Fingerstick (45409) Radiology Referral (Radiology)  Problem # 3:  AODM (ICD-250.00)  His updated medication list for this problem includes:    Glipizide 2.5 Mg Tb24 (Glipizide)  .Marland Kitchen... Take 1 tablet by mouth once a day    Lisinopril 5 Mg Tabs (Lisinopril) .Marland Kitchen... 1 a day  Orders: Venipuncture (81191) TLB-A1C / Hgb A1C (Glycohemoglobin) (83036-A1C) TLB-Microalbumin/Creat Ratio, Urine (82043-MALB)  Problem # 4:  HYPERLIPIDEMIA (ICD-272.4)  His updated medication list for this problem includes:    Niaspan 1000 Mg Tbcr (Niacin (antihyperlipidemic)) .Marland Kitchen... 2 tablet by mouth once a day    Fenofibrate 160 Mg Tabs (Fenofibrate) .Marland Kitchen... 1 by mouth qd  Orders: TLB-ALT (SGPT) (84460-ALT) TLB-AST (SGOT) (84450-SGOT) TLB-Lipid Panel (80061-LIPID)  Problem # 5:  INGUINAL HERNIA (ICD-550.90) see #1   Problem # 6:  Hx of BENIGN PROSTATIC HYPERTROPHY, HX OF (ICD-V13.8) see #1   Problem # 7:  time spent >> 55 min coordinating pt's care   Complete Medication List: 1)  Avodart 0.5 Mg Caps (Dutasteride) .... Take 1 capsule once a day 2)  Glipizide 2.5 Mg Tb24 (Glipizide) .... Take 1 tablet by mouth once a day 3)  Flomax 0.4 Mg Cp24 (Tamsulosin hcl) .Marland Kitchen.. 1 capsule once a day 4)  Metrogel 1 % Gel (Metronidazole) .... Apply a small amount to affected area at bedtime 5)  Niaspan 1000 Mg Tbcr (Niacin (antihyperlipidemic)) .... 2 tablet by mouth once a day 6)  Flonase 50 Mcg/act Susp (Fluticasone propionate) .Marland Kitchen.. 1 spray each nostril bid 7)  Tetracycline Hcl 500 Mg Caps (Tetracycline hcl) .Marland Kitchen.. 1 by mouth once daily 8)  Cialis 20 Mg Tabs (Tadalafil) .Marland Kitchen.. 1 by mouth; no more than every other day 9)  Lisinopril 5 Mg Tabs (Lisinopril) .Marland Kitchen.. 1 a day 10)  Fenofibrate 160 Mg Tabs (Fenofibrate) .Marland Kitchen.. 1 by mouth qd 11)  One Touch Ultra Test Strips  12)  Vicodin 5-500 Mg Tabs (Hydrocodone-acetaminophen) .Marland Kitchen.. 1 or 2 by mouth every 6 hours as needed pain 13)  Warfarin Sodium 5 Mg Tabs (Warfarin sodium) .... As directed 14)  Lovenox 80 Mg/0.61ml Soln (Enoxaparin sodium) .... 80mg  s.q. two times a day (pre-filled syringes) who  Patient Instructions: 1)  you will need a "coumadin bridge" before  surgery: 2)  d/c coumadin 8 days prior to surgery, let me know so we can check your IN R every 2 days  3)  bridge w/ lovenox---> 80mg  two times a day as soon as INR <2 4)  reassume coumadin post op 5)  Please schedule a follow-up appointment in 3 months .  Prescriptions: LOVENOX 80 MG/0.8ML SOLN (ENOXAPARIN SODIUM) 80mg  s.q. two times a day (pre-filled syringes)  #2 weeks x 1   Entered and Authorized by:   Elita Quick E. Asjah Rauda MD   Signed by:   Nolon Rod. Teshawn Moan MD on 06/22/2009   Method used:   Print then Give to Patient   RxID:   [id number]   Laboratory Results   Blood Tests      INR: 2.7   (Normal Range: 0.88-1.12   Therap INR: 2.0-3.5) Comments: CURRENT DOSE: 5mg  tablets - 1 daily except 1/2 tab on M, W,F recheck in 4 weeks Shary Decamp  Jun 22, 2009 8:59 AM  Appended Document: 4 mth fu/ns/kdc chronic, nonocclusive  DVTs bilaterally please fax results to surgery, FYI  okay to proceed with surgery as planned  Appended Document: 4 mth fu/ns/kdc faxed to Dr. Retta Diones  Appended Document: 4 mth fu/ns/kdc faxed to Dr. Michaell Cowing

## 2010-03-22 NOTE — Assessment & Plan Note (Signed)
Summary: pt/swh   Nurse Visit   Vital Signs:  Patient profile:   66 year old male Pulse rate:   76 / minute BP sitting:   110 / 70  (left arm)  Vitals Entered By: [name] (April 15, 2009 10:47 AM) CC: pt check    Allergies: No Known Drug Allergies Laboratory Results   Blood Tests      INR: 4.8   (Normal Range: 0.88-1.12   Therap INR: 2.0-3.5) Comments: current dose: 5mg  daily (35mg /week) PLAN: hold x 1 day, then 5mg  tab: 1 a day, 1/2 MWF (27.5mg /week) next INR ----> 10 days  Felipa Laroche E. Valarie Farace MD  April 15, 2009 10:59 AM    patient aware of instructions.......Marland KitchenDoristine Devoid  April 15, 2009 11:08 AM     Orders Added: 1)  Est. Patient Level I [99211] 2)  Protime [01093AT]

## 2010-03-22 NOTE — Progress Notes (Signed)
Summary: swelling  Phone Note Call from Patient Call back at [phone]   Summary of Call:   Patient is working part-time.  Leg is not swollen in am.  After getting home @ night leg is swollen.  Patient elevates as much as possible @ work & will elevate when at home.  Wife just wants to make sure this is ok.  No pain, no other sxs.  Advised wife that I would expect  swollen  after being on his feet all day.  Wife wants to know how long she should expect it to swell? Shary Decamp  April 01, 2009 1:35 PM   Follow-up for Phone Call        he just had a DVT , the leg will swell up x a while, he needs to keep that leg elevated as much as poss. and start a mild pressure compression stoking  (this was d/w them at time of the OV, I actually rec him not to work x a while)  Harlo Jaso E. Holbert Caples MD  April 01, 2009 6:49 PM   discussed with wife Shary Decamp  April 02, 2009 11:29 AM

## 2010-03-22 NOTE — Letter (Signed)
Summary: [facility]   Imported By: Lanelle Bal 04/26/2009 08:55:16  _____________________________________________________________________  External Attachment:    Type:   Image     Comment:   External Document

## 2010-03-22 NOTE — Letter (Signed)
Summary: [facility]  [facility]   Imported By: Lanelle Bal 03/31/2009 09:05:55  _____________________________________________________________________  External Attachment:    Type:   Image     Comment:   External Document

## 2010-03-24 NOTE — Assessment & Plan Note (Signed)
Summary: pt/cbs   Nurse Visit   Allergies: No Known Drug Allergies Laboratory Results   Blood Tests   Date/Time Received: Douglas Mercer [name]  March 04, 2010 11:10 AM  Date/Time Reported: Douglas Mercer CMA  March 04, 2010 11:10 AM    INR: 1.9   (Normal Range: 0.88-1.12   Therap INR: 2.0-3.5) Comments: Patient notes that he has 5mg  tabs at home and takes 5mg  M,W,F and 2.5mg  all other days. Patient is aware I will call him with MD recommendations at [phone] advise. Douglas Mercer CMA  March 04, 2010 11:10 AM  advise patient: continue with same dose but come back in 2 weeks   because the INR is slightly low Kelis Plasse E. Apryl Brymer MD  March 04, 2010 4:25 PM   Patient notified. Douglas Mercer CMA  March 07, 2010 11:27 AM     Orders Added: 1)  Est. Patient Level I [99211] 2)  Protime [[id number]]

## 2010-03-24 NOTE — Assessment & Plan Note (Signed)
Summary: pt/cbs      Allergies Added: NKDA Nurse Visit   Vital Signs:  Patient profile:   65 year old male Height:      70 inches Weight:      175 pounds O2 Sat:      100 % on Room air Pulse rate:   91 / minute Resp:     18 per minute BP sitting:   106 / 56  (left arm)  Vitals Entered By: Jeremy Johann CMA (February 04, 2010 11:00 AM)  O2 Flow:  Room air CC: PT/INR   Current Medications (verified): 1)  Glipizide 2.5 Mg Tb24 (Glipizide) .... Take 2 Tablet By Mouth Once A Day 2)  Metrogel 1 % Gel (Metronidazole) .... Apply A Small Amount To Affected Area At Bedtime 3)  Niaspan 1000 Mg Tbcr (Niacin (Antihyperlipidemic)) .... 2 Tablet By Mouth Once A Day 4)  Flonase 50 Mcg/act Susp (Fluticasone Propionate) .Marland Kitchen.. 1 Spray Each Nostril Bid 5)  Tetracycline Hcl 500 Mg Caps (Tetracycline Hcl) .Marland Kitchen.. 1 By Mouth Once Daily 6)  Lisinopril 5 Mg Tabs (Lisinopril) .Marland Kitchen.. 1 A Day 7)  Fenofibrate 160 Mg  Tabs (Fenofibrate) .Marland Kitchen.. 1 By Mouth Qd 8)  One Touch Ultra Test Strips 9)  Warfarin Sodium 5 Mg Tabs (Warfarin Sodium) .... As Directed 10)  Levitra 20 Mg Tabs (Vardenafil Hcl) .... Once A Week As Needed 11)  Mvi/fish Oil 12)  Co Q10 13)  Ketoconazole 2 % Crea (Ketoconazole) .... Apply Twice A Day For 2 Weeks, Left Foot  Allergies (verified): No Known Drug Allergies Laboratory Results   Blood Tests      INR: 2.3   (Normal Range: 0.88-1.12   Therap INR: 2.0-3.5) Comments: Current Dose: (2.5mg ) 1/2 tab daily except (5mg ) 1 tab M,W,F..............Marland KitchenFelecia Deloach CMA  February 04, 2010 11:02 AM Pt advised to cont current dose, f/u in 4 weeks and will call with any further changes in instruction...........Marland KitchenFelecia Deloach CMA  February 04, 2010 11:06 AM    agree---------------Douglas E. Paz MD  February 04, 2010 1:59 PM     Orders Added: 1)  Est. Patient Level I [99211] 2)  Protime [[id number]]    ANTICOAGULATION RECORD PREVIOUS REGIMEN & LAB RESULTS Anticoagulation Diagnosis:  Deep  venous thrombosis on  12/10/2009  Previous INR:  2.0 on  01/07/2010 Previous Coumadin Dose(mg):  5mg  3 days, 2.5mg  all other days on  12/10/2009 Previous Regimen:  SAME on  01/01/2009 Previous Coagulation Comments:  no change 4 weeks on  09/27/2007  NEW REGIMEN & LAB RESULTS Current INR: 2.3 Current Coumadin Dose(mg): (2.5mg ) 1/2 tab daily except (5mg ) 1 tab M,W,F Regimen: same  Douglas Mercer: Mercer Repeat testing in: 4 weeks  Anticoagulation Visit Questionnaire Coumadin dose missed/changed:  No Abnormal Bleeding Symptoms:  No  Any diet changes including alcohol intake, vegetables or greens since the last visit:  No Any illnesses or hospitalizations since the last visit:  No Any signs of clotting since the last visit (including chest discomfort, dizziness, shortness of breath, arm tingling, slurred speech, swelling or redness in leg):  No  MEDICATIONS GLIPIZIDE 2.5 MG TB24 (GLIPIZIDE) Take 2 tablet by mouth once a day METROGEL 1 % GEL (METRONIDAZOLE) Apply a small amount to affected area at bedtime NIASPAN 1000 MG TBCR (NIACIN (ANTIHYPERLIPIDEMIC)) 2 tablet by mouth once a day FLONASE 50 MCG/ACT SUSP (FLUTICASONE PROPIONATE) 1 spray each nostril bid TETRACYCLINE HCL 500 MG CAPS (TETRACYCLINE HCL) 1 by mouth once daily LISINOPRIL 5 MG TABS (LISINOPRIL) 1 a day  FENOFIBRATE 160 MG  TABS (FENOFIBRATE) 1 by mouth qd * ONE TOUCH ULTRA TEST STRIPS  WARFARIN SODIUM 5 MG TABS (WARFARIN SODIUM) as directed LEVITRA 20 MG TABS (VARDENAFIL HCL) once a week as needed * MVI/FISH OIL  * CO Q10  KETOCONAZOLE 2 % CREA (KETOCONAZOLE) apply twice a day for 2 weeks, left foot

## 2010-03-30 NOTE — Letter (Signed)
Summary: s/p TURP 6-11, doing well, PSA qo year[facility]  [facility]   Imported By: Lanelle Bal 03/18/2010 09:33:13  _____________________________________________________________________  External Attachment:    Type:   Image     Comment:   External Document

## 2010-03-30 NOTE — Assessment & Plan Note (Signed)
Summary: PT CHECK/KB   Nurse Visit   Vital Signs:  Patient profile:   65 year old male Height:      70 inches (177.80 cm) Weight:      174.25 pounds (79.20 kg) BMI:     25.09 Temp:     97.8 degrees F (36.56 degrees C) oral BP sitting:   100 / 60  (left arm) Cuff size:   large  Vitals Entered By: Lucious Groves CMA (March 21, 2010 11:17 AM) CC: PT check./kb   Allergies: No Known Drug Allergies Laboratory Results   Blood Tests   Date/Time Received: Lucious Groves [facility]  March 21, 2010 11:17 AM  Date/Time Reported: Lucious Groves CMA  March 21, 2010 11:17 AM    INR: 2.6   (Normal Range: 0.88-1.12   Therap INR: 2.0-3.5) Comments: Patient notes that he has 5mg  tabs and takes 1 on M,W,F and 0.5 tabs all other days. Patient advised to continue the same and re-check in 4 weeks. Lucious Groves CMA  March 21, 2010 11:18 AM  no change, recheck in 4 weeks  Jesica Goheen E. Sabah Zucco MD  March 21, 2010 5:46 PM     Orders Added: 1)  Est. Patient Level I [91478] 2)  Protime [[id number]]

## 2010-04-18 ENCOUNTER — Ambulatory Visit (INDEPENDENT_AMBULATORY_CARE_PROVIDER_SITE_OTHER): Payer: BC Managed Care – PPO

## 2010-04-18 ENCOUNTER — Ambulatory Visit: Payer: BC Managed Care – PPO | Admitting: Internal Medicine

## 2010-04-18 ENCOUNTER — Encounter: Payer: Self-pay | Admitting: Internal Medicine

## 2010-04-18 DIAGNOSIS — Z5181 Encounter for therapeutic drug level monitoring: Secondary | ICD-10-CM

## 2010-04-18 DIAGNOSIS — Z7901 Long term (current) use of anticoagulants: Secondary | ICD-10-CM

## 2010-04-18 DIAGNOSIS — I82409 Acute embolism and thrombosis of unspecified deep veins of unspecified lower extremity: Secondary | ICD-10-CM

## 2010-04-28 NOTE — Medication Information (Signed)
Summary: pt check///sph   Indication 1: Deep venous thrombosis  Vital Signs: Weight: 172 lbs.  Pulse Rate: 68  Blood Pressure:  106 / 58            Allergies: No Known Drug Allergies  Anticoagulation Management History:      Positive risk factors for bleeding include presence of serious comorbidities.  Negative risk factors for bleeding include an age less than 64 years old.  The bleeding index is 'intermediate risk'.  Positive CHADS2 values include History of Diabetes.  Negative CHADS2 values include Age > 80 years old.  His last INR was 2.6 and today's INR is 2.2.    Anticoagulation Management Assessment/Plan:      The patient's current anticoagulation dose is Warfarin sodium 5 mg tabs: as directed.  The next INR is due 4 weeks.        Vital Signs:  Patient profile:   65 year old male Height:      70 inches Weight:      172 pounds Temp:     98.0 degrees F oral Pulse rate:   68 / minute BP sitting:   106 / 58  (left arm)  Vitals Entered By: Jeremy Johann CMA (April 18, 2010 11:03 AM)  CC: PT/inr       Laboratory Results   Blood Tests      INR: 2.2   (Normal Range: 0.88-1.12   Therap INR: 2.0-3.5) Comments: current dose:(5mg ) 1 tab M,W,F and (2.5mg ) all other days. Pt advise 4 weeks and will call with any changes in dosing, Ok to leave message..........Marland KitchenFelecia Deloach CMA  April 18, 2010 11:13 AM  AGREE WITH PLAN [place] E. Jamelyn Bovard MD  April 18, 2010 5:00 PM         ANTICOAGULATION RECORD PREVIOUS REGIMEN & LAB RESULTS Anticoagulation Diagnosis:  Deep venous thrombosis on  12/10/2009  Previous INR:  2.6 on  03/21/2010 Previous Coumadin Dose(mg):  (2.5mg ) 1/2 tab daily except (5mg ) 1 tab M,W,F on  02/04/2010 Previous Regimen:  same on  02/04/2010 Previous Coagulation Comments:  no change 4 weeks on  09/27/2007  NEW REGIMEN & LAB RESULTS Current INR: 2.2 Current Coumadin Dose(mg): (5mg ) 1 tab M,W,F and (2.5mg ) all other days Regimen:  same  Provider: Dewitte Vannice Repeat testing in: 4 weeks  Anticoagulation Visit Questionnaire Coumadin dose missed/changed:  No Abnormal Bleeding Symptoms:  No  Any diet changes including alcohol intake, vegetables or greens since the last visit:  No Any illnesses or hospitalizations since the last visit:  No Any signs of clotting since the last visit (including chest discomfort, dizziness, shortness of breath, arm tingling, slurred speech, swelling or redness in leg):  No  MEDICATIONS GLIPIZIDE 2.5 MG TB24 (GLIPIZIDE) Take 2 tablet by mouth once a day METROGEL 1 % GEL (METRONIDAZOLE) Apply a small amount to affected area at bedtime NIASPAN 1000 MG TBCR (NIACIN (ANTIHYPERLIPIDEMIC)) 2 tablet by mouth once a day FLONASE 50 MCG/ACT SUSP (FLUTICASONE PROPIONATE) 1 spray each nostril bid TETRACYCLINE HCL 500 MG CAPS (TETRACYCLINE HCL) 1 by mouth once daily LISINOPRIL 5 MG TABS (LISINOPRIL) 1 a day FENOFIBRATE 160 MG  TABS (FENOFIBRATE) 1 by mouth qd * ONE TOUCH ULTRA TEST STRIPS  WARFARIN SODIUM 5 MG TABS (WARFARIN SODIUM) as directed LEVITRA 20 MG TABS (VARDENAFIL HCL) once a week as needed * MVI/FISH OIL  * CO Q10  KETOCONAZOLE 2 % CREA (KETOCONAZOLE) apply twice a day for 2 weeks, left foot

## 2010-05-02 ENCOUNTER — Encounter: Payer: Self-pay | Admitting: Internal Medicine

## 2010-05-02 ENCOUNTER — Ambulatory Visit (INDEPENDENT_AMBULATORY_CARE_PROVIDER_SITE_OTHER): Payer: BC Managed Care – PPO | Admitting: Internal Medicine

## 2010-05-02 ENCOUNTER — Other Ambulatory Visit: Payer: Self-pay | Admitting: Internal Medicine

## 2010-05-02 DIAGNOSIS — E119 Type 2 diabetes mellitus without complications: Secondary | ICD-10-CM

## 2010-05-02 LAB — HEMOGLOBIN A1C: Hgb A1c MFr Bld: 6.9 % — ABNORMAL HIGH (ref 4.6–6.5)

## 2010-05-08 LAB — CBC
HCT: 36.9 % — ABNORMAL LOW (ref 39.0–52.0)
MCH: 31.5 pg (ref 26.0–34.0)
MCH: 32 pg (ref 26.0–34.0)
MCHC: 33.7 g/dL (ref 30.0–36.0)
MCV: 95.1 fL (ref 78.0–100.0)
MCV: 95.6 fL (ref 78.0–100.0)
Platelets: 101 10*3/uL — ABNORMAL LOW (ref 150–400)
Platelets: 105 10*3/uL — ABNORMAL LOW (ref 150–400)
RBC: 3.92 MIL/uL — ABNORMAL LOW (ref 4.22–5.81)
RDW: 13.8 % (ref 11.5–15.5)
RDW: 14.1 % (ref 11.5–15.5)
WBC: 5.6 10*3/uL (ref 4.0–10.5)

## 2010-05-08 LAB — GLUCOSE, CAPILLARY
Glucose-Capillary: 137 mg/dL — ABNORMAL HIGH (ref 70–99)
Glucose-Capillary: 157 mg/dL — ABNORMAL HIGH (ref 70–99)
Glucose-Capillary: 160 mg/dL — ABNORMAL HIGH (ref 70–99)
Glucose-Capillary: 161 mg/dL — ABNORMAL HIGH (ref 70–99)
Glucose-Capillary: 200 mg/dL — ABNORMAL HIGH (ref 70–99)
Glucose-Capillary: 207 mg/dL — ABNORMAL HIGH (ref 70–99)
Glucose-Capillary: 249 mg/dL — ABNORMAL HIGH (ref 70–99)
Glucose-Capillary: 94 mg/dL (ref 70–99)

## 2010-05-08 LAB — BASIC METABOLIC PANEL
BUN: 14 mg/dL (ref 6–23)
BUN: 8 mg/dL (ref 6–23)
CO2: 25 mEq/L (ref 19–32)
CO2: 27 mEq/L (ref 19–32)
Calcium: 8.1 mg/dL — ABNORMAL LOW (ref 8.4–10.5)
Chloride: 108 mEq/L (ref 96–112)
Chloride: 109 mEq/L (ref 96–112)
Creatinine, Ser: 1.12 mg/dL (ref 0.4–1.5)
Creatinine, Ser: 1.13 mg/dL (ref 0.4–1.5)
GFR calc Af Amer: >60 mL/min (ref >60)
Glucose, Bld: 208 mg/dL — ABNORMAL HIGH (ref 70–99)
Potassium: 4.4 mEq/L (ref 3.5–5.1)

## 2010-05-08 LAB — PROTIME-INR: Prothrombin Time: 14.9 seconds (ref 11.6–15.2)

## 2010-05-09 LAB — PROTIME-INR
INR: 1.51 — ABNORMAL HIGH (ref 0.00–1.49)
INR: 1.58 — ABNORMAL HIGH (ref 0.00–1.49)
INR: 1.63 — ABNORMAL HIGH (ref 0.00–1.49)
INR: 1.69 — ABNORMAL HIGH (ref 0.00–1.49)
INR: 1.82 — ABNORMAL HIGH (ref 0.00–1.49)
Prothrombin Time: 18.1 seconds — ABNORMAL HIGH (ref 11.6–15.2)
Prothrombin Time: 18.7 seconds — ABNORMAL HIGH (ref 11.6–15.2)
Prothrombin Time: 19.2 seconds — ABNORMAL HIGH (ref 11.6–15.2)
Prothrombin Time: 19.7 seconds — ABNORMAL HIGH (ref 11.6–15.2)
Prothrombin Time: 20.9 seconds — ABNORMAL HIGH (ref 11.6–15.2)

## 2010-05-09 LAB — CBC
HCT: 38.5 % — ABNORMAL LOW (ref 39.0–52.0)
HCT: 47.5 % (ref 39.0–52.0)
Hemoglobin: 12.6 g/dL — ABNORMAL LOW (ref 13.0–17.0)
Hemoglobin: 13.2 g/dL (ref 13.0–17.0)
Hemoglobin: 13.6 g/dL (ref 13.0–17.0)
MCHC: 34.1 g/dL (ref 30.0–36.0)
MCHC: 34.4 g/dL (ref 30.0–36.0)
MCHC: 35 g/dL (ref 30.0–36.0)
MCHC: 35.1 g/dL (ref 30.0–36.0)
MCV: 93.7 fL (ref 78.0–100.0)
MCV: 94.4 fL (ref 78.0–100.0)
Platelets: 138 10*3/uL — ABNORMAL LOW (ref 150–400)
Platelets: 142 10*3/uL — ABNORMAL LOW (ref 150–400)
RBC: 3.93 MIL/uL — ABNORMAL LOW (ref 4.22–5.81)
RBC: 3.97 MIL/uL — ABNORMAL LOW (ref 4.22–5.81)
RBC: 4.08 MIL/uL — ABNORMAL LOW (ref 4.22–5.81)
RBC: 4.1 MIL/uL — ABNORMAL LOW (ref 4.22–5.81)
RDW: 13.5 % (ref 11.5–15.5)
RDW: 13.9 % (ref 11.5–15.5)
RDW: 13.7 % (ref 11.5–15.5)
WBC: 3.6 10*3/uL — ABNORMAL LOW (ref 4.0–10.5)
WBC: 4.4 10*3/uL (ref 4.0–10.5)
WBC: 4.9 10*3/uL (ref 4.0–10.5)

## 2010-05-09 LAB — COMPREHENSIVE METABOLIC PANEL
ALT: 19 U/L (ref 0–53)
Albumin: 4.4 g/dL (ref 3.5–5.2)
Alkaline Phosphatase: 59 U/L (ref 39–117)
BUN: 15 mg/dL (ref 6–23)
BUN: 16 mg/dL (ref 6–23)
CO2: 27 mEq/L (ref 19–32)
CO2: 28 mEq/L (ref 19–32)
Calcium: 8.8 mg/dL (ref 8.4–10.5)
Calcium: 9.3 mg/dL (ref 8.4–10.5)
Calcium: 9.5 mg/dL (ref 8.4–10.5)
Creatinine, Ser: 1.15 mg/dL (ref 0.4–1.5)
GFR calc Af Amer: >60 mL/min (ref >60)
GFR calc non Af Amer: >60 mL/min (ref >60)
GFR calc non Af Amer: >60 mL/min (ref >60)
Glucose, Bld: 132 mg/dL — ABNORMAL HIGH (ref 70–99)
Glucose, Bld: 168 mg/dL — ABNORMAL HIGH (ref 70–99)
Potassium: 4.2 mEq/L (ref 3.5–5.1)
Sodium: 136 mEq/L (ref 135–145)
Sodium: 138 mEq/L (ref 135–145)
Total Protein: 7.2 g/dL (ref 6.0–8.3)

## 2010-05-09 LAB — GLUCOSE, CAPILLARY
Glucose-Capillary: 113 mg/dL — ABNORMAL HIGH (ref 70–99)
Glucose-Capillary: 136 mg/dL — ABNORMAL HIGH (ref 70–99)
Glucose-Capillary: 141 mg/dL — ABNORMAL HIGH (ref 70–99)
Glucose-Capillary: 144 mg/dL — ABNORMAL HIGH (ref 70–99)
Glucose-Capillary: 156 mg/dL — ABNORMAL HIGH (ref 70–99)
Glucose-Capillary: 162 mg/dL — ABNORMAL HIGH (ref 70–99)
Glucose-Capillary: 180 mg/dL — ABNORMAL HIGH (ref 70–99)

## 2010-05-09 LAB — URINALYSIS, ROUTINE W REFLEX MICROSCOPIC
Bilirubin Urine: NEGATIVE
Ketones, ur: NEGATIVE mg/dL
Nitrite: NEGATIVE
Protein, ur: NEGATIVE mg/dL

## 2010-05-09 LAB — CULTURE, BLOOD (ROUTINE X 2)
Culture: NO GROWTH
Culture: NO GROWTH

## 2010-05-09 LAB — BASIC METABOLIC PANEL
Chloride: 104 mEq/L (ref 96–112)
Creatinine, Ser: 1.2 mg/dL (ref 0.4–1.5)
GFR calc Af Amer: >60 mL/min (ref >60)

## 2010-05-09 LAB — SURGICAL PCR SCREEN
MRSA, PCR: NEGATIVE
Staphylococcus aureus: NEGATIVE

## 2010-05-09 LAB — WOUND CULTURE: Gram Stain: NONE SEEN

## 2010-05-10 NOTE — Assessment & Plan Note (Signed)
Summary: 4 month followup/sph   Vital Signs:  Patient profile:   65 year old male Weight:      175 pounds Pulse rate:   76 / minute Pulse rhythm:   regular BP sitting:   116 / 80  (left arm) Cuff size:   large  Vitals Entered By: Army Fossa CMA (May 02, 2010 8:00 AM) CC: 4 month f/u- fasting  Comments no complaints  [facility] [address] Refill on [place]   History of Present Illness: ROV  feeling well  his last A1c was more than 7 , we increase his glipizide, the  ambulatory blood sugars  remain about the same 140-150  Ambulatory blood pressures within normal   Current Medications (verified): 1)  Glipizide 2.5 Mg Tb24 (Glipizide) .... Take 2 Tablet By Mouth Once A Day 2)  Metrogel 1 % Gel (Metronidazole) .... Apply A Small Amount To Affected Area At Bedtime 3)  Niaspan 1000 Mg Tbcr (Niacin (Antihyperlipidemic)) .... 2 Tablet By Mouth Once A Day 4)  Flonase 50 Mcg/act Susp (Fluticasone Propionate) .Marland Kitchen.. 1 Spray Each Nostril Bid 5)  Tetracycline Hcl 500 Mg Caps (Tetracycline Hcl) .Marland Kitchen.. 1 By Mouth Once Daily 6)  Lisinopril 5 Mg Tabs (Lisinopril) .Marland Kitchen.. 1 A Day 7)  Fenofibrate 160 Mg  Tabs (Fenofibrate) .Marland Kitchen.. 1 By Mouth Qd 8)  One Touch Ultra Test Strips 9)  Warfarin Sodium 5 Mg Tabs (Warfarin Sodium) .... As Directed 10)  Levitra 20 Mg Tabs (Vardenafil Hcl) .... Once A Week As Needed 11)  Mvi/fish Oil 12)  Co Q10 13)  Ketoconazole 2 % Crea (Ketoconazole) .... Apply Twice A Day For 2 Weeks, Left Foot  Allergies (verified): No Known Drug Allergies  Past History:  Past Medical History: Reviewed history from 09/22/2009 and no changes required. Allergic rhinitis Hyperlipidemia AODM h/o BPH Pulmonary embolism, hx of (1992), Bilateral with PE, reportedly (+) Lupus anticoagulant large left lower extremity DVT 02/2009 ED Rosacea neg stress test in [place] (aprox 2005)--per pt  Past Surgical History: Reviewed history from 09/22/2009 and no changes required. Inguinal  herniorrhaphy (right-sided, 1995, mesh placed ) Tonsillectomy 07-2009: --Transurethral resection of the prostate performed on August 13, 2009.  --Right inguinal hernia repair with mesh (laparoscopic).   Social History: Reviewed history from 10/16/2008 and no changes required. 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 GI:  Denies diarrhea, nausea, and vomiting. Derm:   was treated for a rash in November,  rash has improved but is not gone.  Physical Exam  General:  alert, well-developed, and well-nourished.   Lungs:  normal respiratory effort, no intercostal retractions, no accessory muscle use, and normal breath sounds.   Heart:   normal rate, regular rhythm, and no murmur.   Skin:  on the inner aspect of the left heel he has an area were the skin is slightly thick-purple , no more satellite    lesions    Impression & Recommendations:  Problem # 1:  RASH-NONVESICULAR (ICD-782.1)  improved, what I see today  is probably postinflammatory hyperpigmentation. If not completely well, he will let me know for a referral The following medications were removed from the medication list:    Ketoconazole 2 % Crea (Ketoconazole) .Marland Kitchen... Apply twice a day for 2 weeks, left foot  Problem # 2:  AODM (ICD-250.00)  based on the last  hemoglobin A1c  glipizide was increased. If control is not better , will consider metformin or other  medicines.  his diet is satisfactory and he does not think he can improve it. he recognizes he could improve his exercise and will try  His updated medication list for this problem includes:    Lisinopril 5 Mg Tabs (Lisinopril) .Marland Kitchen... 1 a day    Glipizide 2.5 Mg Tb24 (Glipizide) .Marland Kitchen... Take 2 tablet by mouth once a day  Orders: Venipuncture  (11914) TLB-A1C / Hgb A1C (Glycohemoglobin) (83036-A1C)  Labs Reviewed: Creat: 1.0 (12/30/2009)     Last Eye Exam: normal (02/21/2008) Reviewed HgBA1c results: 7.5 (12/30/2009)  6.3 (06/22/2009)  Complete Medication List: 1)  Warfarin Sodium 5 Mg Tabs (Warfarin sodium) .... As directed 2)  Lisinopril 5 Mg Tabs (Lisinopril) .Marland Kitchen.. 1 a day 3)  Glipizide 2.5 Mg Tb24 (Glipizide) .... Take 2 tablet by mouth once a day 4)  Metrogel 1 % Gel (Metronidazole) .... Apply a small amount to affected area at bedtime 5)  Niaspan 1000 Mg Tbcr (Niacin (antihyperlipidemic)) .... 2 tablet by mouth once a day 6)  Flonase 50 Mcg/act Susp (Fluticasone propionate) .Marland Kitchen.. 1 spray each nostril bid 7)  Tetracycline Hcl 500 Mg Caps (Tetracycline hcl) .Marland Kitchen.. 1 by mouth once daily 8)  Fenofibrate 160 Mg Tabs (Fenofibrate) .Marland Kitchen.. 1 by mouth qd 9)  One Touch Ultra Test Strips  10)  Levitra 20 Mg Tabs (Vardenafil hcl) .... Once a week as needed 11)  Mvi/fish Oil  12)  Co Q10   Other Orders: Specimen Handling (78295) Specimen Handling (62130)  Patient Instructions: 1)  Please schedule a follow-up appointment in 4 months .  Prescriptions: FLONASE 50 MCG/ACT SUSP (FLUTICASONE PROPIONATE) 1 spray each nostril bid  #16.0 Gram x 11   Entered and Authorized by:   Mackena Plummer E. Zaniya Mcaulay MD   Signed by:   Nolon Rod. Kosei Rhodes MD on 05/02/2010   Method used:   Electronically to        [id number] (retail)       [address] [place], [place]  [address]       Ph: [phone]       Fax: [phone]   RxID:   [id number] WARFARIN SODIUM 5 MG TABS (WARFARIN SODIUM) as directed  #90 x 3   Entered by:   Army Fossa CMA   Authorized by:   Nolon Rod. Oriyah Lamphear MD   Signed by:   Army Fossa CMA on 05/02/2010   Method used:   Electronically to        [id number] (retail)       [address] [place], [place]  [address]       Ph: [phone]       Fax: [phone]   RxID:    [id number]    Orders Added: 1)  Venipuncture [36415] 2)  TLB-A1C / Hgb A1C (Glycohemoglobin) [83036-A1C] 3)  Specimen Handling [99000] 4)  Venipuncture [36415] 5)  Specimen Handling [99000] 6)  Est. Patient Level III [83151]

## 2010-05-20 ENCOUNTER — Ambulatory Visit (INDEPENDENT_AMBULATORY_CARE_PROVIDER_SITE_OTHER): Payer: BC Managed Care – PPO | Admitting: *Deleted

## 2010-05-20 DIAGNOSIS — I82409 Acute embolism and thrombosis of unspecified deep veins of unspecified lower extremity: Secondary | ICD-10-CM

## 2010-05-20 DIAGNOSIS — Z7901 Long term (current) use of anticoagulants: Secondary | ICD-10-CM

## 2010-05-20 LAB — POCT INR: INR: 2.1

## 2010-05-20 NOTE — Patient Instructions (Addendum)
Pt advise no change recheck 4 weeks. Chemira Yetta Barre Agree [facility]

## 2010-06-17 ENCOUNTER — Ambulatory Visit (INDEPENDENT_AMBULATORY_CARE_PROVIDER_SITE_OTHER): Payer: BC Managed Care – PPO | Admitting: *Deleted

## 2010-06-17 ENCOUNTER — Other Ambulatory Visit: Payer: Self-pay | Admitting: *Deleted

## 2010-06-17 DIAGNOSIS — Z7901 Long term (current) use of anticoagulants: Secondary | ICD-10-CM

## 2010-06-17 DIAGNOSIS — I82409 Acute embolism and thrombosis of unspecified deep veins of unspecified lower extremity: Secondary | ICD-10-CM

## 2010-06-17 MED ORDER — GLIPIZIDE ER 2.5 MG PO TB24: 2.5000 mg | ORAL_TABLET | Freq: Every day | ORAL | Status: DC

## 2010-06-17 NOTE — Patient Instructions (Addendum)
Pt informed no change 4 weeks.  ------------------------ Agree Douglas Mercer  

## 2010-07-08 NOTE — Assessment & Plan Note (Signed)
[facility]                         GASTROENTEROLOGY OFFICE NOTE   NICHOLSON, STARACE                      MRN:          [id number]  DATE:04/23/2006                            DOB:          March 17, 1945    REASON FOR CONSULTATION:  History of colon polyps.   ASSESSMENT:  A 65 year old white man with a history of antiphospholipid  antibody syndrome and DVT and pulmonary embolism in 1992.  He also has a  history of colon polyps removed last 2003 or 2004 in [place],  [place].  I do not yet have those records but we are getting those.   PLAN:  Once I review the records we will determine exactly when he needs  another colonoscopy.  At that time he will need to stop his Coumadin for  3 or 4 days before the procedure.  He understands the risk of stroke or  blood clot off of that, though that is rare, it is possible.  He has  come of Coumadin in the past for a few days.  Risks, benefits, and  indications of colonoscopy have been reviewed with the patient otherwise  (the standard risks).   HISTORY:  He is having no problems now.  He had a colonoscopy by Dr.  Frankey Shown in [place], [place] 2003 or 2004, I think he has had other  ones as well.  He has paternal uncles with colon cancer.  He thinks he  may be due for a colonoscopy, though he is not entirely sure at this  time.  Dr. Drue Novel has numerous records and has raised the question of  whether or not this is appropriate timing for a surveillance  colonoscopy.   PAST MEDICAL HISTORY:  1. Type 2 diabetes mellitus.  2. Antiphospholipid antibody syndrome with pulmonary embolism and DVT      in 1992.  3. Allergies and sinus problems.  4. Appendectomy.  5. Right-sided hernia repair.  6. Tonsillectomy.  7. Adenoidectomy.  8. Hypertension.  9. Dyslipidemia.  10.Rosacea.  11.Benign prostatic hypertrophy.  12.Recent problems with some prostatitis.   MEDICATIONS:  1. Coumadin as directed.  2.  Glipizide 2.5 mg daily.  3. Tricor 145 mg daily.  4. Niaspan 2000 mg 2 a day.  5. Altace 10 mg daily.  6. Avodart 0.5 mg daily.  7. Tetracycline 500 mg daily.  8. Flomax 0.4 mg daily.  9. Flonase 2 puffs each nostril daily.  10.Metrogel to the face daily.  11.Diabetes Health Pack 1 packet a day.  12.CoQ-10 seventy-five mg daily.   FAMILY HISTORY:  Paternal uncles had __________  cancer.   SOCIAL HISTORY:  The patient is married, he is a Oncologist.  Moved back to [place] area in the previous year after 6  years in [place] previously living here.  Occasional alcohol, no tobacco  or drug use.   REVIEW OF SYSTEMS:  Otherwise negative.   PHYSICAL EXAMINATION:  Reveals a well-developed, well-nourished, white  man.  Height 5 foot 10, weight 182 pounds, blood pressure 120/60, pulse  80.  EYES:  Anicteric.  CHEST:  Clear.  HEART:  S1, S2, no rubs or gallops.  ABDOMEN:  Soft, nontender, without organomegaly or mass.  He is alert and oriented x3.   Appreciate the opportunity to care for this patient.     Iva Boop, MD,FACG  Electronically Signed    CEG/MedQ  DD: 04/23/2006  DT: 04/23/2006  Job #: [id number]   cc:   Willow Ora, MD

## 2010-07-15 ENCOUNTER — Ambulatory Visit (INDEPENDENT_AMBULATORY_CARE_PROVIDER_SITE_OTHER): Payer: BC Managed Care – PPO | Admitting: *Deleted

## 2010-07-15 DIAGNOSIS — I82409 Acute embolism and thrombosis of unspecified deep veins of unspecified lower extremity: Secondary | ICD-10-CM

## 2010-07-15 DIAGNOSIS — Z7901 Long term (current) use of anticoagulants: Secondary | ICD-10-CM

## 2010-07-15 NOTE — Patient Instructions (Addendum)
Per Dr.Paz we would call him w/ instructions, Pt is aware.  --------------------------------- Currently on 25 mg a week. Change Coumadin 5 mg to 1 tablet daily except Mondays Wednesdays and Fridays take only half tablet (27.5 mg weekly) INR in 2 weeks Douglas Mercer  I spoke w/ pt he is aware, appt made.

## 2010-07-27 ENCOUNTER — Other Ambulatory Visit: Payer: Self-pay | Admitting: Internal Medicine

## 2010-08-05 ENCOUNTER — Ambulatory Visit (INDEPENDENT_AMBULATORY_CARE_PROVIDER_SITE_OTHER): Payer: BC Managed Care – PPO | Admitting: *Deleted

## 2010-08-05 DIAGNOSIS — I82409 Acute embolism and thrombosis of unspecified deep veins of unspecified lower extremity: Secondary | ICD-10-CM

## 2010-08-05 DIAGNOSIS — Z7901 Long term (current) use of anticoagulants: Secondary | ICD-10-CM

## 2010-08-05 LAB — POCT INR: INR: 2.3

## 2010-08-05 NOTE — Patient Instructions (Addendum)
Pt informed no change recheck 4 weeks. Douglas Mercer   Agree  [facility]

## 2010-08-22 ENCOUNTER — Other Ambulatory Visit: Payer: Self-pay | Admitting: Internal Medicine

## 2010-08-22 MED ORDER — TETRACYCLINE HCL 500 MG PO CAPS: 500.0000 mg | ORAL_CAPSULE | Freq: Every day | ORAL | Status: DC

## 2010-08-30 ENCOUNTER — Telehealth: Payer: Self-pay | Admitting: *Deleted

## 2010-08-30 NOTE — Telephone Encounter (Signed)
Pharmacy called and states that the Tetracycline is no longer being made by the manufacture, they don't know if they will start making it again. Is there an alternative medication for pt?

## 2010-09-01 MED ORDER — DOXYCYCLINE HYCLATE 50 MG PO CAPS: 50.0000 mg | ORAL_CAPSULE | Freq: Every day | ORAL | Status: DC

## 2010-09-01 NOTE — Telephone Encounter (Signed)
We could change to doxycycline 50 mg one by mouth daily, #90, one refill

## 2010-09-02 ENCOUNTER — Encounter: Payer: Self-pay | Admitting: Internal Medicine

## 2010-09-02 ENCOUNTER — Ambulatory Visit (INDEPENDENT_AMBULATORY_CARE_PROVIDER_SITE_OTHER): Payer: BC Managed Care – PPO | Admitting: Internal Medicine

## 2010-09-02 DIAGNOSIS — E785 Hyperlipidemia, unspecified: Secondary | ICD-10-CM

## 2010-09-02 DIAGNOSIS — L719 Rosacea, unspecified: Secondary | ICD-10-CM

## 2010-09-02 DIAGNOSIS — I82409 Acute embolism and thrombosis of unspecified deep veins of unspecified lower extremity: Secondary | ICD-10-CM

## 2010-09-02 DIAGNOSIS — Z7901 Long term (current) use of anticoagulants: Secondary | ICD-10-CM

## 2010-09-02 DIAGNOSIS — I868 Varicose veins of other specified sites: Secondary | ICD-10-CM

## 2010-09-02 DIAGNOSIS — E119 Type 2 diabetes mellitus without complications: Secondary | ICD-10-CM

## 2010-09-02 LAB — HEMOGLOBIN A1C: Hgb A1c MFr Bld: 7.4 % — ABNORMAL HIGH (ref 4.6–6.5)

## 2010-09-02 LAB — BASIC METABOLIC PANEL
BUN: 19 mg/dL (ref 6–23)
CO2: 30 mEq/L (ref 19–32)
Chloride: 105 mEq/L (ref 96–112)
GFR: 62.28 mL/min (ref >60.00)
Glucose, Bld: 160 mg/dL — ABNORMAL HIGH (ref 70–99)
Potassium: 4.7 mEq/L (ref 3.5–5.1)

## 2010-09-02 LAB — POCT INR: INR: 2.2

## 2010-09-02 LAB — LIPID PANEL: HDL: 37.3 mg/dL — ABNORMAL LOW (ref >39.00)

## 2010-09-02 NOTE — Assessment & Plan Note (Signed)
Extensive varicose veins in the lower extremities. An area of the wound in the left leg, he has extensive small vein varicosities. I think the chronic wound is due to varicose veins. Recommend observation for now

## 2010-09-02 NOTE — Assessment & Plan Note (Signed)
Recently changed from tetracycline to doxycycline.

## 2010-09-02 NOTE — Progress Notes (Signed)
  Subjective:    Patient ID: Douglas Mercer, male    DOB: 29-Apr-1945, 65 y.o.   MRN: [id number]  HPI ROV DM-- good med compliance, CBGs ~ 140 HTN-- good mec compliance, no amb BPs but whenver goes to the doctor is fine  Wound--he scratched his left foot months ago, area has not completely healed since then. Prior to this scratched, skin was free of moles or lesions. He does have superficial varicosities. Rosacea--wonders if we already changed his antibiotics (we did ) Coumadin check-- see instructions    Past Medical History  Diagnosis Date  . Allergic rhinitis   . Hyperlipidemia   . Diabetes mellitus   . BPH (benign prostatic hypertrophy)   . Pulmonary embolism 1992    bilateral w/ PE reportedly (+) lupus anticoagulant  . DVT (deep venous thrombosis) 02/2009    large left lower  . ED (erectile dysfunction)   . Rosacea    Past Surgical History  Procedure Date  . Inguinal hernia repair 1995    right-sided, mesh placed  . Tonsillectomy   . 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   Cardiovascular: Negative for chest pain and leg swelling.  Gastrointestinal: Negative for abdominal pain and blood in stool.       Objective:   Physical Exam  Constitutional: He appears well-developed and well-nourished. No distress.  Cardiovascular: Normal rate, regular rhythm and normal heart sounds.   No murmur heard. Pulmonary/Chest: Effort normal and breath sounds normal. No respiratory distress. He has no wheezes. He has no rales.  Musculoskeletal:       Feet:       DIABETIC FEET EXAM: No lower extremity edema Normal pedal pulses bilaterally  Nails: Quite thick, some of them are long. Pinprick  examination of the feet normal. ------------------ Lower extremity varicose veins without phlebitis           Assessment & Plan:

## 2010-09-02 NOTE — Patient Instructions (Addendum)
Continue with the same Coumadin, come back in 2 weeks for a Coumadin check. (we changed your antibiotic for rosacea)

## 2010-09-02 NOTE — Assessment & Plan Note (Signed)
Due for labs

## 2010-09-02 NOTE — Assessment & Plan Note (Addendum)
Due for labs, if not at goal, ?metformin Feet exam ok except for nails, rec trimming

## 2010-09-07 ENCOUNTER — Telehealth: Payer: Self-pay | Admitting: *Deleted

## 2010-09-07 MED ORDER — METFORMIN HCL 850 MG PO TABS: ORAL_TABLET | ORAL | Status: DC

## 2010-09-07 NOTE — Telephone Encounter (Signed)
Pt is aware.  

## 2010-09-07 NOTE — Telephone Encounter (Signed)
Message copied by Leanne Lovely on Wed Sep 07, 2010  8:54 AM ------      Message from: Willow Ora E      Created: Tue Sep 06, 2010  5:28 PM       Advise patient:      Diabetes needs better control, start metformin 850 mg one tablet twice a day. (The first few days is recommended to take only one tablet to prevent side effects, if he develop GI side effects he is to let me know)      Triglycerides  andLDL also slt elevated In addition to diet and exercise, we'll see if by controlling his diabetes better the triglycerides decrease.       Other labs normal, f/u as planned

## 2010-09-16 ENCOUNTER — Ambulatory Visit (INDEPENDENT_AMBULATORY_CARE_PROVIDER_SITE_OTHER): Payer: BC Managed Care – PPO | Admitting: *Deleted

## 2010-09-16 DIAGNOSIS — I82409 Acute embolism and thrombosis of unspecified deep veins of unspecified lower extremity: Secondary | ICD-10-CM

## 2010-09-16 DIAGNOSIS — Z7901 Long term (current) use of anticoagulants: Secondary | ICD-10-CM

## 2010-09-16 NOTE — Patient Instructions (Signed)
Pt aware no change 4 weeks

## 2010-10-02 ENCOUNTER — Other Ambulatory Visit: Payer: Self-pay | Admitting: Internal Medicine

## 2010-10-03 NOTE — Telephone Encounter (Signed)
Rx Done . 

## 2010-10-14 ENCOUNTER — Ambulatory Visit (INDEPENDENT_AMBULATORY_CARE_PROVIDER_SITE_OTHER): Payer: BC Managed Care – PPO | Admitting: *Deleted

## 2010-10-14 DIAGNOSIS — Z7901 Long term (current) use of anticoagulants: Secondary | ICD-10-CM

## 2010-10-14 DIAGNOSIS — I82409 Acute embolism and thrombosis of unspecified deep veins of unspecified lower extremity: Secondary | ICD-10-CM

## 2010-10-14 LAB — POCT INR: INR: 1.9

## 2010-10-14 NOTE — Patient Instructions (Addendum)
Pt instructed forwarded to Dr. Drue Novel for review informed there may be no change but will call to w/ instructions. Douglas Mercer  -------------------------------------------- inr usually ok, no change, recheck in 2 weeks  [facility]    Pt aware [name]

## 2010-10-28 ENCOUNTER — Ambulatory Visit (INDEPENDENT_AMBULATORY_CARE_PROVIDER_SITE_OTHER): Payer: BC Managed Care – PPO | Admitting: *Deleted

## 2010-10-28 DIAGNOSIS — I82409 Acute embolism and thrombosis of unspecified deep veins of unspecified lower extremity: Secondary | ICD-10-CM

## 2010-10-28 DIAGNOSIS — Z7901 Long term (current) use of anticoagulants: Secondary | ICD-10-CM

## 2010-10-28 NOTE — Patient Instructions (Addendum)
Return to office in 4 weeks No change continue current dose: 1 tab daily except 1/2 M,W,F ------------------------- Cont present care  [facility]

## 2010-11-07 ENCOUNTER — Telehealth: Payer: Self-pay | Admitting: *Deleted

## 2010-11-07 NOTE — Telephone Encounter (Signed)
1. Ask patient to come for labs only:  BMP, AST, ALT---- DX diabetes   2. Let me know if he is having nausea, vomiting, feeling poorly.

## 2010-11-07 NOTE — Telephone Encounter (Signed)
Pt states that since changing to metformin 850 mg fasting BS have been running 240-250. Pt also c/o fruity smelling urine. Pt denies any other symptoms. Please advise

## 2010-11-09 NOTE — Telephone Encounter (Signed)
Left message to call office

## 2010-11-09 NOTE — Telephone Encounter (Signed)
Pt return call Left message to call office.  

## 2010-11-11 ENCOUNTER — Ambulatory Visit: Payer: BC Managed Care – PPO

## 2010-11-11 NOTE — Telephone Encounter (Addendum)
Pt denies any nausea, vomiting or any other symptoms beside BS running high  Pt scheduled for labs.

## 2010-11-11 NOTE — Telephone Encounter (Signed)
You ment running "high" ?

## 2010-11-15 ENCOUNTER — Other Ambulatory Visit: Payer: Self-pay | Admitting: Internal Medicine

## 2010-11-16 ENCOUNTER — Other Ambulatory Visit (INDEPENDENT_AMBULATORY_CARE_PROVIDER_SITE_OTHER): Payer: BC Managed Care – PPO

## 2010-11-16 DIAGNOSIS — E119 Type 2 diabetes mellitus without complications: Secondary | ICD-10-CM

## 2010-11-16 LAB — BASIC METABOLIC PANEL
CO2: 28 mEq/L (ref 19–32)
Calcium: 9.9 mg/dL (ref 8.4–10.5)
Chloride: 101 mEq/L (ref 96–112)
Creatinine, Ser: 1.1 mg/dL (ref 0.4–1.5)
Glucose, Bld: 261 mg/dL — ABNORMAL HIGH (ref 70–99)
Sodium: 138 mEq/L (ref 135–145)

## 2010-11-16 NOTE — Progress Notes (Signed)
12  

## 2010-11-18 ENCOUNTER — Other Ambulatory Visit: Payer: Self-pay | Admitting: *Deleted

## 2010-11-18 MED ORDER — BLOOD GLUCOSE MONITORING SUPPL MISC: 1.0000 | Status: DC

## 2010-11-23 ENCOUNTER — Telehealth: Payer: Self-pay | Admitting: *Deleted

## 2010-11-23 NOTE — Telephone Encounter (Signed)
Lab results mailed. Pt informed.

## 2010-11-25 ENCOUNTER — Ambulatory Visit (INDEPENDENT_AMBULATORY_CARE_PROVIDER_SITE_OTHER): Payer: BC Managed Care – PPO | Admitting: *Deleted

## 2010-11-25 DIAGNOSIS — Z7901 Long term (current) use of anticoagulants: Secondary | ICD-10-CM

## 2010-11-25 DIAGNOSIS — I82409 Acute embolism and thrombosis of unspecified deep veins of unspecified lower extremity: Secondary | ICD-10-CM

## 2010-11-25 NOTE — Patient Instructions (Addendum)
Return to office in 4 weeks for PT/INR  Continue current dosing: (5 mg) 1 tab daily except for (2.5 mg) 1/2 tab M,W,F ------------------------------------ Agree Willow Ora

## 2010-12-02 ENCOUNTER — Other Ambulatory Visit: Payer: Self-pay | Admitting: Internal Medicine

## 2010-12-02 NOTE — Telephone Encounter (Signed)
Done

## 2010-12-06 ENCOUNTER — Other Ambulatory Visit: Payer: Self-pay | Admitting: Internal Medicine

## 2010-12-07 NOTE — Telephone Encounter (Signed)
Done

## 2010-12-12 ENCOUNTER — Other Ambulatory Visit: Payer: Self-pay | Admitting: Internal Medicine

## 2010-12-12 DIAGNOSIS — E119 Type 2 diabetes mellitus without complications: Secondary | ICD-10-CM

## 2010-12-12 DIAGNOSIS — Z7901 Long term (current) use of anticoagulants: Secondary | ICD-10-CM

## 2010-12-12 DIAGNOSIS — I82409 Acute embolism and thrombosis of unspecified deep veins of unspecified lower extremity: Secondary | ICD-10-CM

## 2010-12-12 DIAGNOSIS — I868 Varicose veins of other specified sites: Secondary | ICD-10-CM

## 2010-12-12 DIAGNOSIS — L719 Rosacea, unspecified: Secondary | ICD-10-CM

## 2010-12-12 DIAGNOSIS — E785 Hyperlipidemia, unspecified: Secondary | ICD-10-CM

## 2010-12-12 MED ORDER — LISINOPRIL 5 MG PO TABS: 5.0000 mg | ORAL_TABLET | Freq: Every day | ORAL | Status: DC

## 2010-12-12 NOTE — Telephone Encounter (Signed)
Done

## 2010-12-20 ENCOUNTER — Other Ambulatory Visit: Payer: Self-pay | Admitting: Internal Medicine

## 2010-12-20 DIAGNOSIS — E119 Type 2 diabetes mellitus without complications: Secondary | ICD-10-CM

## 2010-12-20 DIAGNOSIS — I868 Varicose veins of other specified sites: Secondary | ICD-10-CM

## 2010-12-20 DIAGNOSIS — E785 Hyperlipidemia, unspecified: Secondary | ICD-10-CM

## 2010-12-20 DIAGNOSIS — Z7901 Long term (current) use of anticoagulants: Secondary | ICD-10-CM

## 2010-12-20 DIAGNOSIS — L719 Rosacea, unspecified: Secondary | ICD-10-CM

## 2010-12-20 DIAGNOSIS — I82409 Acute embolism and thrombosis of unspecified deep veins of unspecified lower extremity: Secondary | ICD-10-CM

## 2010-12-20 MED ORDER — LISINOPRIL 5 MG PO TABS: 5.0000 mg | ORAL_TABLET | Freq: Every day | ORAL | Status: DC

## 2010-12-20 NOTE — Telephone Encounter (Signed)
Done

## 2010-12-23 ENCOUNTER — Ambulatory Visit (INDEPENDENT_AMBULATORY_CARE_PROVIDER_SITE_OTHER): Payer: BC Managed Care – PPO | Admitting: *Deleted

## 2010-12-23 DIAGNOSIS — Z7901 Long term (current) use of anticoagulants: Secondary | ICD-10-CM

## 2010-12-23 DIAGNOSIS — I82409 Acute embolism and thrombosis of unspecified deep veins of unspecified lower extremity: Secondary | ICD-10-CM

## 2010-12-23 NOTE — Patient Instructions (Addendum)
Return in 4 weeks to check INR  Continue with current  dosing 1 tab daily except 1/2 tab on M,W,F ------------------------ Agree Willow Ora

## 2011-01-03 ENCOUNTER — Encounter: Payer: Self-pay | Admitting: Internal Medicine

## 2011-01-03 ENCOUNTER — Ambulatory Visit (INDEPENDENT_AMBULATORY_CARE_PROVIDER_SITE_OTHER): Payer: BC Managed Care – PPO | Admitting: Internal Medicine

## 2011-01-03 DIAGNOSIS — Z0000 Encounter for general adult medical examination without abnormal findings: Secondary | ICD-10-CM

## 2011-01-03 DIAGNOSIS — L719 Rosacea, unspecified: Secondary | ICD-10-CM

## 2011-01-03 DIAGNOSIS — E119 Type 2 diabetes mellitus without complications: Secondary | ICD-10-CM

## 2011-01-03 DIAGNOSIS — Z87898 Personal history of other specified conditions: Secondary | ICD-10-CM

## 2011-01-03 DIAGNOSIS — E785 Hyperlipidemia, unspecified: Secondary | ICD-10-CM

## 2011-01-03 DIAGNOSIS — Z2911 Encounter for prophylactic immunotherapy for respiratory syncytial virus (RSV): Secondary | ICD-10-CM

## 2011-01-03 NOTE — Assessment & Plan Note (Signed)
Sees urology ~ once ayear, next visit ` 02-2011

## 2011-01-03 NOTE — Assessment & Plan Note (Signed)
Currently well controlled , has occ exhacerbation

## 2011-01-03 NOTE — Patient Instructions (Addendum)
Next INR ~12-2 -12 See med list below, you need 2 diabetes meds ,watch for low sugars

## 2011-01-03 NOTE — Assessment & Plan Note (Addendum)
Td  2007, Pneumovax  2011, Shingles immunization --provided today had a flu shot already elsewhere per pt   PSA--per urology EKG today done but apparently posted in a different chart, will bring the patient back and get a new EKG Colonoscopy 05/08/2006,   was found to have a polyp, tubular adenoma. Next Cscope per GI   diet and exercise discussed All labs reviewed Diet-exercise discussed

## 2011-01-03 NOTE — Assessment & Plan Note (Addendum)
CBGs elevated, we added metformin base on last a1C, unfortunately he d/c glipizide , apparently a misunderstanding: Plan: restart glipizide, ov 3 months , will call if blood sugars are not going below 140

## 2011-01-03 NOTE — Progress Notes (Signed)
  Subjective:    Patient ID: Douglas Mercer, male    DOB: 10-May-1945, 65 y.o.   MRN: [id number]  HPI CPX  Past Medical History  Diagnosis Date  . Allergic rhinitis   . Hyperlipidemia   . Diabetes mellitus   . BPH (benign prostatic hypertrophy)   . Pulmonary embolism 1992    bilateral w/ PE reportedly (+) lupus anticoagulant  . DVT (deep venous thrombosis) 02/2009    large left lower  . ED (erectile dysfunction)   . Rosacea    Past Surgical History  Procedure Date  . Inguinal hernia repair 1995    right-sided, mesh placed  . Tonsillectomy   . Transurethral resection of prostate 08/13/09   History   Social History  . Marital Status: Married    Spouse Name: N/A    Number of Children: N/A  . Years of Education: N/A   Occupational History  . 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 Smoking status: Never Smoker   . Smokeless tobacco: Never Used  . Alcohol Use: Yes     socially  . Drug Use: No  . Sexually Active: Not on file   Other Topics Concern  . Not on file   Social History Narrative   moved from [place] in 2007 (original from [place])---Married, stepchildren x 2 , 4 G children and 2 GG children   Family History  Problem Relation Age of Onset  . Diabetes Mother     uncle  . Heart attack Neg Hx   . Stroke      grandparents  . Colon cancer Neg Hx   . Prostate cancer Neg Hx   . Melanoma Mother      Review of Systems DM-- once we started metformin he self d/c glipizide, CBGs 200s Rosacea-- good med compliance, sx well controlled at present Had a cold 10 days ago, feels better, no F/C, mild cough w/yellow sputum, getting better No CP-SOB No N-V-D, blood in stools , no abd pain    Objective:   Physical Exam  Constitutional: He is oriented to person, place, and time. He appears well-developed and well-nourished. No distress.  HENT:  Head: Normocephalic and atraumatic.       Nose slt congested  Neck: No thyromegaly present.    Cardiovascular: Normal rate, regular rhythm and normal heart sounds.   No murmur heard. Pulmonary/Chest: Effort normal and breath sounds normal. No respiratory distress. He has no wheezes. He has no rales.  Abdominal: Soft. Bowel sounds are normal. He exhibits no distension. There is no tenderness. There is no rebound and no guarding.  Musculoskeletal: He exhibits no edema.  Neurological: He is alert and oriented to person, place, and time.  Skin: He is not diaphoretic.  Psychiatric: He has a normal mood and affect. His behavior is normal. Judgment and thought content normal.          Assessment & Plan:

## 2011-01-03 NOTE — Assessment & Plan Note (Signed)
Needs better control, FLP on RTC , anticipate FLP will be better once DM better

## 2011-01-16 ENCOUNTER — Other Ambulatory Visit: Payer: Self-pay | Admitting: Internal Medicine

## 2011-01-17 ENCOUNTER — Other Ambulatory Visit: Payer: Self-pay | Admitting: Internal Medicine

## 2011-01-20 ENCOUNTER — Ambulatory Visit (INDEPENDENT_AMBULATORY_CARE_PROVIDER_SITE_OTHER): Payer: BC Managed Care – PPO | Admitting: *Deleted

## 2011-01-20 DIAGNOSIS — I82409 Acute embolism and thrombosis of unspecified deep veins of unspecified lower extremity: Secondary | ICD-10-CM

## 2011-01-20 DIAGNOSIS — Z7901 Long term (current) use of anticoagulants: Secondary | ICD-10-CM

## 2011-01-20 LAB — POCT INR: INR: 2.2

## 2011-01-20 NOTE — Patient Instructions (Signed)
Return to office in 4 weeks  Continue current dose: 1 tab daily except 1/2 tab on M,W,F

## 2011-02-03 ENCOUNTER — Encounter: Payer: Self-pay | Admitting: Internal Medicine

## 2011-02-07 ENCOUNTER — Other Ambulatory Visit: Payer: Self-pay | Admitting: Internal Medicine

## 2011-02-08 ENCOUNTER — Telehealth: Payer: Self-pay | Admitting: Internal Medicine

## 2011-02-08 MED ORDER — METFORMIN HCL 850 MG PO TABS: ORAL_TABLET | ORAL | Status: DC

## 2011-02-08 NOTE — Telephone Encounter (Signed)
Discuss with patient, Rx sent to pharmacy. 

## 2011-02-08 NOTE — Telephone Encounter (Signed)
Chart review, I do believe that he takes metformin 850 twice a day. Call the prescription in " twice a day" and let the pt know patient no

## 2011-02-08 NOTE — Telephone Encounter (Signed)
Pt last seen on 01-03-11 and per our list Pt to take 1 TABLET BY MOUTH TWICE DAILY WITH MEALS.Please advise on any change to med instruction

## 2011-02-08 NOTE — Telephone Encounter (Signed)
Dose for metformin was changed patient takes 3 times a day - he was taking it 2 times a day needs new rx -[facility] [address]

## 2011-02-22 ENCOUNTER — Telehealth: Payer: Self-pay | Admitting: *Deleted

## 2011-02-22 ENCOUNTER — Ambulatory Visit (INDEPENDENT_AMBULATORY_CARE_PROVIDER_SITE_OTHER): Payer: Medicare Other | Admitting: *Deleted

## 2011-02-22 DIAGNOSIS — E785 Hyperlipidemia, unspecified: Secondary | ICD-10-CM

## 2011-02-22 DIAGNOSIS — E119 Type 2 diabetes mellitus without complications: Secondary | ICD-10-CM

## 2011-02-22 DIAGNOSIS — L719 Rosacea, unspecified: Secondary | ICD-10-CM

## 2011-02-22 DIAGNOSIS — I82409 Acute embolism and thrombosis of unspecified deep veins of unspecified lower extremity: Secondary | ICD-10-CM

## 2011-02-22 DIAGNOSIS — Z7901 Long term (current) use of anticoagulants: Secondary | ICD-10-CM

## 2011-02-22 DIAGNOSIS — I868 Varicose veins of other specified sites: Secondary | ICD-10-CM

## 2011-02-22 MED ORDER — METFORMIN HCL 850 MG PO TABS: ORAL_TABLET | ORAL | Status: DC

## 2011-02-22 MED ORDER — LISINOPRIL 5 MG PO TABS: 5.0000 mg | ORAL_TABLET | Freq: Every day | ORAL | Status: DC

## 2011-02-22 MED ORDER — DOXYCYCLINE HYCLATE 50 MG PO CAPS: 50.0000 mg | ORAL_CAPSULE | Freq: Every day | ORAL | Status: DC

## 2011-02-22 MED ORDER — WARFARIN SODIUM 5 MG PO TABS: ORAL_TABLET | ORAL | Status: DC

## 2011-02-22 MED ORDER — FLUTICASONE PROPIONATE 50 MCG/ACT NA SUSP: 2.0000 | Freq: Every day | NASAL | Status: DC

## 2011-02-22 MED ORDER — NIACIN ER (ANTIHYPERLIPIDEMIC) 1000 MG PO TBCR: EXTENDED_RELEASE_TABLET | ORAL | Status: DC

## 2011-02-22 MED ORDER — GLIPIZIDE ER 2.5 MG PO TB24: 2.5000 mg | ORAL_TABLET | Freq: Every day | ORAL | Status: DC

## 2011-02-22 MED ORDER — FENOFIBRATE 160 MG PO TABS: 160.0000 mg | ORAL_TABLET | Freq: Every day | ORAL | Status: DC

## 2011-02-22 NOTE — Telephone Encounter (Signed)
Pt came in for PT/INR and would like to get Rx for diabetic shoe..Please advise

## 2011-02-22 NOTE — Patient Instructions (Signed)
Return to office in 10 days  No change continue current dose:  1 tab daily except 1/2 tab on M,W,F

## 2011-03-03 NOTE — Telephone Encounter (Signed)
I don't see that he has peripheral vascular disease or neuropathy. Cannot prescribe at this point but will discuss on return to the office

## 2011-03-03 NOTE — Telephone Encounter (Signed)
Discuss with patient  

## 2011-03-06 ENCOUNTER — Encounter: Payer: Self-pay | Admitting: *Deleted

## 2011-03-06 ENCOUNTER — Ambulatory Visit (INDEPENDENT_AMBULATORY_CARE_PROVIDER_SITE_OTHER): Payer: Medicare Other | Admitting: *Deleted

## 2011-03-06 DIAGNOSIS — I82409 Acute embolism and thrombosis of unspecified deep veins of unspecified lower extremity: Secondary | ICD-10-CM

## 2011-03-06 DIAGNOSIS — Z7901 Long term (current) use of anticoagulants: Secondary | ICD-10-CM

## 2011-03-06 NOTE — Patient Instructions (Signed)
1 tab daily except 1/2 tab on Wednesday Recheck in 2 weeks per JEP.

## 2011-03-21 ENCOUNTER — Ambulatory Visit (INDEPENDENT_AMBULATORY_CARE_PROVIDER_SITE_OTHER): Payer: Medicare Other | Admitting: *Deleted

## 2011-03-21 DIAGNOSIS — I82409 Acute embolism and thrombosis of unspecified deep veins of unspecified lower extremity: Secondary | ICD-10-CM

## 2011-03-21 DIAGNOSIS — Z7901 Long term (current) use of anticoagulants: Secondary | ICD-10-CM

## 2011-03-21 LAB — POCT INR: INR: 2.1

## 2011-03-21 NOTE — Patient Instructions (Signed)
INR therapeutic. No change, 4 weeks. JP

## 2011-04-07 ENCOUNTER — Telehealth: Payer: Self-pay | Admitting: Internal Medicine

## 2011-04-07 DIAGNOSIS — L719 Rosacea, unspecified: Secondary | ICD-10-CM

## 2011-04-07 DIAGNOSIS — E785 Hyperlipidemia, unspecified: Secondary | ICD-10-CM

## 2011-04-07 DIAGNOSIS — I82409 Acute embolism and thrombosis of unspecified deep veins of unspecified lower extremity: Secondary | ICD-10-CM

## 2011-04-07 DIAGNOSIS — E119 Type 2 diabetes mellitus without complications: Secondary | ICD-10-CM

## 2011-04-07 DIAGNOSIS — I868 Varicose veins of other specified sites: Secondary | ICD-10-CM

## 2011-04-07 DIAGNOSIS — Z7901 Long term (current) use of anticoagulants: Secondary | ICD-10-CM

## 2011-04-07 MED ORDER — LISINOPRIL 5 MG PO TABS: 5.0000 mg | ORAL_TABLET | Freq: Every day | ORAL | Status: DC

## 2011-04-07 NOTE — Telephone Encounter (Signed)
Refill done.  

## 2011-04-07 NOTE — Telephone Encounter (Signed)
Refill: Lisinopril tab 5 mg.   Pharmacy comments: patient is requesting a 90 day supply.

## 2011-04-11 ENCOUNTER — Encounter: Payer: Self-pay | Admitting: Internal Medicine

## 2011-04-11 ENCOUNTER — Ambulatory Visit (INDEPENDENT_AMBULATORY_CARE_PROVIDER_SITE_OTHER): Payer: Medicare Other | Admitting: Internal Medicine

## 2011-04-11 VITALS — BP 112/72 | HR 84 | Temp 97.7°F | Wt 172.0 lb

## 2011-04-11 DIAGNOSIS — E785 Hyperlipidemia, unspecified: Secondary | ICD-10-CM

## 2011-04-11 DIAGNOSIS — E119 Type 2 diabetes mellitus without complications: Secondary | ICD-10-CM

## 2011-04-11 LAB — LIPID PANEL
LDL Cholesterol: 79 mg/dL (ref 0–99)
VLDL: 28.2 mg/dL (ref 0.0–40.0)

## 2011-04-11 LAB — BASIC METABOLIC PANEL
BUN: 17 mg/dL (ref 6–23)
CO2: 28 mEq/L (ref 19–32)
Chloride: 106 mEq/L (ref 96–112)
Creatinine, Ser: 1.1 mg/dL (ref 0.4–1.5)

## 2011-04-11 NOTE — Progress Notes (Signed)
  Subjective:    Patient ID: Douglas Mercer, male    DOB: 09/10/1945, 66 y.o.   MRN: [id number]  HPI ROV Since the last visit , he started glipizide, sugars are better around 135 on 140   Past Medical History: Diabetes  hyperlipidemia Allergic rhinitis h/o BPH Pulmonary embolism, hx of (1992), Bilateral with PE, reportedly (+) Lupus anticoagulant large left lower extremity DVT 02/2009 ED Rosacea neg stress test in [place] (aprox 2005)--per pt  Past Surgical History: Inguinal herniorrhaphy (right-sided, 1995, mesh placed ) Tonsillectomy 07-2009: --Transurethral resection of the prostate performed on August 13, 2009.  --Right inguinal hernia repair with mesh (laparoscopic).   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 BP today normal. Denies chest pain or shortness of breath Denies low blood sugar symptoms    Objective:   Physical Exam  Alert, oriented x3, in no apparent distress. Lungs are clear to auscultation Cardiovascular regular rate and rhythm      Assessment & Plan:

## 2011-04-11 NOTE — Assessment & Plan Note (Signed)
Good compliance w/glipizide, labs  Increase glipizide dose if needed

## 2011-04-11 NOTE — Patient Instructions (Signed)
Next coumadin as planned (~ 10 days)

## 2011-04-11 NOTE — Assessment & Plan Note (Signed)
Due for labs  chart reviewed-- has not taken statins before

## 2011-04-13 ENCOUNTER — Encounter: Payer: Self-pay | Admitting: *Deleted

## 2011-04-18 ENCOUNTER — Encounter: Payer: Self-pay | Admitting: Internal Medicine

## 2011-04-18 ENCOUNTER — Ambulatory Visit (INDEPENDENT_AMBULATORY_CARE_PROVIDER_SITE_OTHER): Payer: Medicare Other | Admitting: *Deleted

## 2011-04-18 DIAGNOSIS — Z7901 Long term (current) use of anticoagulants: Secondary | ICD-10-CM

## 2011-04-18 DIAGNOSIS — I82409 Acute embolism and thrombosis of unspecified deep veins of unspecified lower extremity: Secondary | ICD-10-CM

## 2011-04-18 MED ORDER — WARFARIN SODIUM 4 MG PO TABS: ORAL_TABLET | ORAL | Status: DC

## 2011-04-18 NOTE — Patient Instructions (Signed)
INR low Current dose 5 mg tabs: 1 tab daily except 1/2 tab on Wednesday (32,5 q/week) New dose 4 mg tabs: 1 tab a day , 2 tabs Tuesday and Friday  (36 mg /week) Recheck in 2 weeks  JP

## 2011-04-24 ENCOUNTER — Encounter: Payer: Self-pay | Admitting: Internal Medicine

## 2011-05-02 ENCOUNTER — Ambulatory Visit: Payer: Medicare Other

## 2011-05-02 ENCOUNTER — Ambulatory Visit (INDEPENDENT_AMBULATORY_CARE_PROVIDER_SITE_OTHER): Payer: Medicare Other | Admitting: *Deleted

## 2011-05-02 DIAGNOSIS — Z7901 Long term (current) use of anticoagulants: Secondary | ICD-10-CM

## 2011-05-02 DIAGNOSIS — Z5181 Encounter for therapeutic drug level monitoring: Secondary | ICD-10-CM

## 2011-05-02 DIAGNOSIS — I4891 Unspecified atrial fibrillation: Secondary | ICD-10-CM

## 2011-05-02 LAB — POCT INR: INR: 2.8

## 2011-05-02 NOTE — Patient Instructions (Addendum)
Today PT/INR 2.8 continue w/ same dose: 4 mg tabs: 1 tab a day , 2 tabs Tuesday and Friday (36 mg /week) Recheck 3 weeks  JP

## 2011-05-12 ENCOUNTER — Encounter: Payer: Self-pay | Admitting: Internal Medicine

## 2011-05-12 ENCOUNTER — Ambulatory Visit (INDEPENDENT_AMBULATORY_CARE_PROVIDER_SITE_OTHER): Payer: Medicare Other | Admitting: Internal Medicine

## 2011-05-12 VITALS — BP 110/64 | HR 88 | Ht 70.0 in | Wt 172.4 lb

## 2011-05-12 DIAGNOSIS — Z7901 Long term (current) use of anticoagulants: Secondary | ICD-10-CM

## 2011-05-12 DIAGNOSIS — R05 Cough: Secondary | ICD-10-CM

## 2011-05-12 DIAGNOSIS — D6859 Other primary thrombophilia: Secondary | ICD-10-CM

## 2011-05-12 DIAGNOSIS — D6861 Antiphospholipid syndrome: Secondary | ICD-10-CM

## 2011-05-12 DIAGNOSIS — Z860100 Personal history of colon polyps, unspecified: Secondary | ICD-10-CM | POA: Insufficient documentation

## 2011-05-12 NOTE — Progress Notes (Signed)
Subjective:    Patient ID: Douglas Mercer, male    DOB: 02/07/1946, 66 y.o.   MRN: [id number]  HPI This pleasant 66 year old white man returns to discuss a routine repeat colonoscopy because of a history of adenomatous colon polyps. Approximately 5 years ago he had a 4 mm adenoma removed and he is at adenomas removed in 2003 and probably in 2004. His first 2 colonoscopies were carried out in [place], last was performed by me here in [place].  He is complaining of intermittent cough for several months. It's a dry cough occurs after eating. His wife is concerned this could be due to reflux. He denies any particular heartburn problems and there is no dysphagia or painful swallowing. Snare started around that time he began doxycycline instead of tetracycline, for chronic rosacea. He actually thinks the tetracycline works better for his rosacea than the doxycycline is. He cannot tell any obvious triggers for his cough. He does drink about 3 glasses of caffeinated tea on a daily basis. He is not a smoker. He has never had heartburn or indigestion problems on a regular basis. He is not disturbed while sleeping, with any of these symptoms.  He takes warfarin chronically for antiphospholipid antibody syndrome and history of DVT and pulmonary emboli. He believes that in the past he has developed a recurrent clot when he comes off of warfarin temporarily for procedures, he thought was related to the last colonoscopy but may of been due to her urologic procedure as I can see he was admitted with DVT in 2011. It looks like that was before urologic procedures.  Not on File Outpatient Prescriptions Prior to Visit  Medication Sig Dispense Refill  . Blood Glucose Monitoring Suppl MISC 1 each by Does not apply route as directed.  100 each  5  . co-enzyme Q-10 30 MG capsule Take 30 mg by mouth daily.        Marland Kitchen doxycycline (VIBRAMYCIN) 50 MG capsule Take 1 capsule (50 mg total) by mouth daily.  90 capsule  0  .  fenofibrate 160 MG tablet Take 1 tablet (160 mg total) by mouth daily.  90 tablet  0  . fish oil-omega-3 fatty acids 1000 MG capsule Take 2 g by mouth daily.        . fluticasone (FLONASE) 50 MCG/ACT nasal spray Place 2 sprays into the nose daily.  48 g  0  . glipiZIDE (GLUCOTROL XL) 2.5 MG 24 hr tablet Take 1 tablet (2.5 mg total) by mouth daily.  90 tablet  0  . lisinopril (PRINIVIL,ZESTRIL) 5 MG tablet Take 1 tablet (5 mg total) by mouth daily.  90 tablet  1  . metFORMIN (GLUCOPHAGE) 850 MG tablet TAKE 1 TABLET BY MOUTH TWICE DAILY WITH MEALS  180 tablet  0  . metroNIDAZOLE (METROGEL) 1 % gel Apply topically daily.        . Multiple Vitamin (MULTIVITAMIN) capsule Diabetic health Pack 6-7 pills a day      . niacin (NIASPAN) 1000 MG CR tablet TAKE 2 TABLETS BY MOUTH ONCE A DAY  180 tablet  0  . vardenafil (LEVITRA) 20 MG tablet Take 20 mg by mouth once a week. As needed.       . warfarin (COUMADIN) 4 MG tablet As directed  30 tablet  1  . warfarin (COUMADIN) 5 MG tablet Take 5 mg by mouth daily.  90 tablet  0   Past Medical History  Diagnosis Date  . Allergic rhinitis   .  Hyperlipidemia   . Diabetes mellitus     type 2  . BPH (benign prostatic hypertrophy)   . Pulmonary embolism 1992    bilateral w/ PE reportedly (+) lupus anticoagulant  . DVT (deep venous thrombosis) 02/2009    large left lower  . ED (erectile dysfunction)   . Rosacea   . Tubular adenoma of colon 05/07/06  . Hypertension   . Internal hemorrhoids    Past Surgical History  Procedure Date  . Inguinal hernia repair 1995    right-sided, mesh placed  . Tonsillectomy   . Transurethral resection of prostate 08/13/09  . Colonoscopy 05/07/2006  . Diverticulosis 07/07/2002,04/19/2001    mild sigmoid  . Colonoscopy with polypectomy 07/07/2002    2 small descending colon polyps  . Colonoscopy with polypectomy 04/19/2001    6mm sessile mucosal polyp in distal sigmoid colon   History   Social History  . Marital Status:  Married    Spouse Name: N/A    Number of Children: 0  . Years of Education: N/A   Occupational History  . Teacher, early years/pre   .     Social History Main Topics  . Smoking status: Never Smoker   . Smokeless tobacco: Never Used  . Alcohol Use: Yes     socially  . Drug Use: No       Other Topics Concern  . None   Social History Narrative   moved from [place] in 2007 (original from [place])---Married, stepchildren x 2 , 4 G children and 2 GG children   Family History  Problem Relation Age of Onset  . Diabetes Mother   . Heart attack Maternal Uncle   . Stroke Maternal Aunt     grandparents  . Colon cancer Paternal Uncle     x2  . Melanoma Mother   . Diabetes Maternal Uncle         Review of Systems Positive for the rosacea, allergies and the things mentioned in the history of present illness. All areas systems negative.    Objective:   Physical Exam General:  Well-developed, well-nourished and in no acute distress Eyes:  anicteric. ENT:   Mouth and posterior pharynx free of lesions.  Neck:   supple w/o thyromegaly or mass.  Lungs: Clear to auscultation bilaterally. Heart:  S1S2, no rubs, murmurs, gallops. Abdomen:  soft, non-tender, no hepatosplenomegaly, hernia, or mass and BS+.  Rectal: Deferred until colonoscopy Lymph:  no cervical or supraclavicular adenopathy. Extremities:   no edema - he has compression goes on. Skin   nose and face surrounding are involved with rosacea, erythema and some pustular changes. Neuro:  A&O x 3.  Psych:  appropriate mood and  Affect.   Data Reviewed: Previous colonoscopies and pathology reports Primary care notes Hospital records       Assessment & Plan:   1. Personal history of colonic polyps   It is an appropriate time to schedule a routine repeat screening and surveillance colonoscopy. However, he has some concerns about coming off of warfarin and developing a recurrent DVT. That is appropriate and I  want to sort that out better with Dr. Drue Novel with respect to whether or not he should take the Lovenox window, the cost could be an issue. It could be possible that he changes to other anticoagulants though the warfarin in general has worked well as long as he can take it. I will discuss with Dr. Drue Novel. The anticoagulation issues increase the risks associated with  colonoscopy and  endoscopy procedures.   2. Cough - post-prandial  He is questioning, at the request of his wife if this could be related to GERD. It might have started around that time he began doxycycline so possibly there is a relationship there with possible pill esophagitis. An upper endoscopy seems reasonable. If he has esophagitis he may need to switch from the doxycycline. Awaiting the sorting out of his anticoagulation prior to scheduling this. Risks benefits and indications were explained he understands and agrees to proceed.   3. Warfarin anticoagulation   4. Anti-phospholipid antibody syndrome with history of DVT's and pulmonary embolism

## 2011-05-12 NOTE — Patient Instructions (Signed)
Dr. Leone Payor will consult with Dr. Drue Novel and we will get back to you regarding future colonoscopy testing.  Please keep a symptom diary regarding your cough

## 2011-05-14 ENCOUNTER — Encounter: Payer: Self-pay | Admitting: Internal Medicine

## 2011-05-14 ENCOUNTER — Telehealth: Payer: Self-pay | Admitting: Internal Medicine

## 2011-05-14 DIAGNOSIS — Z7901 Long term (current) use of anticoagulants: Secondary | ICD-10-CM

## 2011-05-14 NOTE — Telephone Encounter (Signed)
Renee, Please advise pt , I like to refer him to hematology for management of anticoagulation, specifically before procedures. Send the referral letter to hematology

## 2011-05-15 NOTE — Telephone Encounter (Signed)
I called spoke with patient, informed him of Dr. Drue Novel advisement.  Patient understands and is in agreement.

## 2011-05-16 ENCOUNTER — Telehealth: Payer: Self-pay | Admitting: Hematology & Oncology

## 2011-05-16 NOTE — Telephone Encounter (Signed)
Pt aware of 4-24 appointment °

## 2011-05-18 ENCOUNTER — Encounter: Payer: Self-pay | Admitting: Internal Medicine

## 2011-05-24 ENCOUNTER — Ambulatory Visit (INDEPENDENT_AMBULATORY_CARE_PROVIDER_SITE_OTHER): Payer: Medicare Other | Admitting: *Deleted

## 2011-05-24 VITALS — BP 116/70 | HR 92

## 2011-05-24 DIAGNOSIS — I82409 Acute embolism and thrombosis of unspecified deep veins of unspecified lower extremity: Secondary | ICD-10-CM

## 2011-05-24 LAB — POCT INR: INR: 2.6

## 2011-05-24 NOTE — Patient Instructions (Signed)
continue w/ same dose:  4 mg tabs: 1 tab a day , 2 tabs Tuesday and Friday (36 mg /week).  Return in 4 weeks.

## 2011-06-02 ENCOUNTER — Other Ambulatory Visit: Payer: Self-pay | Admitting: Internal Medicine

## 2011-06-02 DIAGNOSIS — E785 Hyperlipidemia, unspecified: Secondary | ICD-10-CM

## 2011-06-02 DIAGNOSIS — I868 Varicose veins of other specified sites: Secondary | ICD-10-CM

## 2011-06-02 DIAGNOSIS — E119 Type 2 diabetes mellitus without complications: Secondary | ICD-10-CM

## 2011-06-02 DIAGNOSIS — I82409 Acute embolism and thrombosis of unspecified deep veins of unspecified lower extremity: Secondary | ICD-10-CM

## 2011-06-02 DIAGNOSIS — Z7901 Long term (current) use of anticoagulants: Secondary | ICD-10-CM

## 2011-06-02 DIAGNOSIS — L719 Rosacea, unspecified: Secondary | ICD-10-CM

## 2011-06-02 MED ORDER — GLIPIZIDE ER 2.5 MG PO TB24: 2.5000 mg | ORAL_TABLET | Freq: Every day | ORAL | Status: DC

## 2011-06-02 MED ORDER — FENOFIBRATE 160 MG PO TABS: 160.0000 mg | ORAL_TABLET | Freq: Every day | ORAL | Status: DC

## 2011-06-02 MED ORDER — NIACIN ER (ANTIHYPERLIPIDEMIC) 1000 MG PO TBCR: EXTENDED_RELEASE_TABLET | ORAL | Status: DC

## 2011-06-02 MED ORDER — LISINOPRIL 5 MG PO TABS: 5.0000 mg | ORAL_TABLET | Freq: Every day | ORAL | Status: DC

## 2011-06-02 MED ORDER — METFORMIN HCL 850 MG PO TABS: ORAL_TABLET | ORAL | Status: DC

## 2011-06-02 MED ORDER — DOXYCYCLINE HYCLATE 50 MG PO CAPS: 50.0000 mg | ORAL_CAPSULE | Freq: Every day | ORAL | Status: DC

## 2011-06-02 NOTE — Telephone Encounter (Signed)
Request 90-day to [employer]: Docycycline, Fenofibrate, Glipizide, lisinopril, metformin, niacin Done.

## 2011-06-14 ENCOUNTER — Other Ambulatory Visit (HOSPITAL_BASED_OUTPATIENT_CLINIC_OR_DEPARTMENT_OTHER): Payer: Medicare Other | Admitting: Lab

## 2011-06-14 ENCOUNTER — Ambulatory Visit: Payer: Medicare Other

## 2011-06-14 ENCOUNTER — Ambulatory Visit (HOSPITAL_BASED_OUTPATIENT_CLINIC_OR_DEPARTMENT_OTHER): Payer: Medicare Other | Admitting: Hematology & Oncology

## 2011-06-14 VITALS — BP 120/73 | HR 92 | Temp 98.3°F | Ht 69.0 in | Wt 171.0 lb

## 2011-06-14 DIAGNOSIS — Z7901 Long term (current) use of anticoagulants: Secondary | ICD-10-CM

## 2011-06-14 DIAGNOSIS — I82409 Acute embolism and thrombosis of unspecified deep veins of unspecified lower extremity: Secondary | ICD-10-CM

## 2011-06-14 DIAGNOSIS — I2699 Other pulmonary embolism without acute cor pulmonale: Secondary | ICD-10-CM

## 2011-06-14 LAB — CBC WITH DIFFERENTIAL (CANCER CENTER ONLY)
BASO%: 0.2 % (ref 0.0–2.0)
EOS%: 0.7 % (ref 0.0–7.0)
Eosinophils Absolute: 0 10*3/uL (ref 0.0–0.5)
MCH: 32 pg (ref 28.0–33.4)
MONO%: 7.2 % (ref 0.0–13.0)
NEUT#: 3.8 10*3/uL (ref 1.5–6.5)
Platelets: 170 10*3/uL (ref 145–400)
RBC: 4.94 10*6/uL (ref 4.20–5.70)
RDW: 13.6 % (ref 11.1–15.7)

## 2011-06-14 MED ORDER — RIVAROXABAN 20 MG PO TABS: 20.0000 mg | ORAL_TABLET | Freq: Every day | ORAL | Status: DC

## 2011-06-14 NOTE — Progress Notes (Signed)
This office note has been dictated.

## 2011-06-15 ENCOUNTER — Telehealth: Payer: Self-pay | Admitting: Hematology & Oncology

## 2011-06-15 NOTE — Telephone Encounter (Signed)
Pt called and cx 07/14/11 apt and resch for 07/12/11

## 2011-06-15 NOTE — Progress Notes (Signed)
CC:   Willow Ora, MD  DIAGNOSIS:  History of pulmonary embolism and DVT.  HISTORY OF PRESENT ILLNESS:  [name] is a very nice 66 year old white gentleman.  He is followed by Dr. Willow Ora.  Douglas Mercer does have a history of thromboembolic disease.  He had a pulmonary embolism back in 1992.  This apparently just developed.  He apparently had a "positive lupus anticoagulant."  He has been on Coumadin.  He says he did apparently go off Coumadin for a little bit.  In 2011, he then developed a lower extremity DVT in the, I think, left leg.  Again, he has been on Coumadin now for a good 10 years.  I am sure that he has had tests done to assess for hypercoagulability, but the patient, himself, does not know of any tests that were positive.  He needs to have a colonoscopy done.  Dr. Drue Novel felt that a hematologic evaluation was indicated to see if there is any role for one of the newer anticoagulants to try to treat him and try to make things easier for invasive procedures.  Mr. Grassel has not had any problems with cough or shortness breath. He has had no issues with bowels or bladder.  He has had prostate resection secondary to BPH.  He says that his PSA was up to 9;  however, biopsies were all negative.  He has had no bleeding with the Coumadin.  He had says there is nobody in the family that he knows of that has any problems with blood clots.  He has had no rashes. He does have rosacea on his face.  Takes doxycycline for this.  Says blood sugars are fairly well controlled.  He has had no hospitalization because of hyper or hypo glycemia.  He has had no issues with renal dysfunction.  Again, Dr. Drue Novel kindly referred Douglas Mercer to the [facility] to see if there is any recommendation that we could make for his long-term anticoagulation needs.  PAST MEDICAL HISTORY: 1. Pulmonary embolism/DVT. 2. Benign prostate hypertrophy. 3. Non-insulin-dependent  diabetes. 4. Right inguinal hernia repair. 5. Hyperlipidemia. 6. Acne rosacea.  ALLERGIES:  None.  CURRENT MEDICATIONS:  Doxycycline 100 mg p.o., I think, b.i.d., Altace 5 mg p.o. daily, Niaspan 1000 mg p.o. daily, TriCor 160 mg p.o. b.i.d., metformin 850 mg p.o. b.i.d., Coumadin 4 mg p.o. daily, and 2 mg p.o. q. Tuesday, Friday, glipizide 2.5 mg p.o. daily.  SOCIAL HISTORY:  Negative for tobacco use.  He has rare alcohol use.  He says he likes bourbon but maybe drinks this a couple times a month.  He has no obvious occupational exposures.  FAMILY HISTORY:  Negative for thromboembolic disease.  This is history of colon cancer in the family.  REVIEW OF SYSTEMS:  As stated in history of present illness.  PHYSICAL EXAMINATION:  This is a well-developed well-nourished white gentleman in no obvious distress.  Vital signs:  Temperature of 98.3, pulse 93, respiratory rate 20, blood pressure 120/73.  Weight is 171. Head and neck exam shows a normocephalic, atraumatic skull.  There are no ocular or oral lesions.  There are no palpable cervical or supraclavicular lymph nodes.  Lungs:  Clear to percussion and auscultation bilaterally.  Cardiac:  Regular rate and rhythm with a normal S1 and S2.  There are no murmurs, rubs or bruits.  Abdomen: Soft with good bowel sounds.  There is no palpable abdominal mass.  There is no fluid wave.  He  has well-healed laparoscopy scars.  There is no palpable hepatosplenomegaly.  Back: No tenderness over the spine, ribs, or hips.  Extremities:  No clubbing, cyanosis or edema of his legs.  He does have some compression stockings of lower legs.  No obvious palpable venous cords are noted in his lower legs.  Has good pulses in his distal extremities.  Skin:  Acne rosacea changes on his face.  Neurologic:  No focal neurological deficits.  LABORATORY STUDIES:  White cell count is 5.4, hemoglobin 15.8, hematocrit 44.9, platelet count 170.  MCV is  91.  IMPRESSION:  Douglas Mercer is a 66 year old gentleman with a history of a pulmonary embolism and lower extremity DVT.  These were separate events.  He says that when his pulmonary embolism developed, he was in the hospital for 38 days.  He says he was transferred to [facility] where they found the problem with his blood clot.  Again, one would have to think that he did have some studies done for hypercoagulability.  We did send a full hypercoagulable panel on him today.  I want to see if we do find anything that might have caused this.  Being that he is on Coumadin, his protein C and protein S levels will be low.  I do not see anything obvious on his physical exam that would suggest a hypercoagulable source.  I do not see any issue that would related to an underlying malignancy.  I do believe that he will need lifelong anticoagulation.  As such, I believe that he would be a good candidate to switch over to Xarelto.  I believe that this would be very convenient for him.  He does not have any renal issues.  He is on him in good shape.  He certainly would be aware of any bleeding issue with Xarelto if it did develop.  I believe that switching him to Xarelto would be a safe for him.  It would make life easy with respect to any invasive procedures that need to be done.  Currently, the recommendations that we have for Xarelto and invasive procedures is that Xarelto can be stopped 2 days before the procedure. He does not need any Lovenox or Arixtra to "bridge" to the procedure. The Xarelto can then be started the day after the procedure.  Mr. Housand is interested in switching over to Xarelto.  Again, I think this would be relatively easy for him.  Will go ahead and plan to get him back to see [place] in 1 month.  At that point in time, I will recheck his protein C and protein S levels.  They should have "normalized" by then if they are not deficient.  I spent a good hour  with Mr. Taul.  I answered all his questions.    ______________________________ Josph Macho, M.D. PRE/MEDQ  D:  06/14/2011  T:  06/15/2011  Job:  [id number]  ADDENDUM:  (+) Lupus Anti-coagulant.  (+) IgG and IgA and IgM ACA and beta-2-GP.  Will consider adding ASA.

## 2011-06-20 LAB — HYPERCOAGULABLE PANEL, COMPREHENSIVE
AntiThromb III Func: 105 % (ref 76–126)
Beta-2 Glyco I IgG: 168 G Units — ABNORMAL HIGH (ref <20)
Beta-2-Glycoprotein I IgM: 39 M Units — ABNORMAL HIGH (ref <20)
DRVVT 1:1 Mix: 78 secs — ABNORMAL HIGH (ref <45.1)
Drvvt confirmation: 2.85 Ratio — ABNORMAL HIGH (ref <1.16)
Lupus Anticoagulant: DETECTED — AB
PTT Lupus Anticoagulant: 98 secs — ABNORMAL HIGH (ref 28.0–43.0)
Protein C Activity: 37 % — ABNORMAL LOW (ref 75–133)
Protein S Activity: 41 % — ABNORMAL LOW (ref 69–129)

## 2011-06-20 LAB — MTHFR DNA ANALYSIS

## 2011-06-20 LAB — D-DIMER, QUANTITATIVE: D-Dimer, Quant: 0.23 ug/mL-FEU (ref 0.00–0.48)

## 2011-06-23 ENCOUNTER — Ambulatory Visit: Payer: Medicare Other

## 2011-07-12 ENCOUNTER — Ambulatory Visit (HOSPITAL_BASED_OUTPATIENT_CLINIC_OR_DEPARTMENT_OTHER): Payer: Medicare Other | Admitting: Hematology & Oncology

## 2011-07-12 ENCOUNTER — Other Ambulatory Visit (HOSPITAL_BASED_OUTPATIENT_CLINIC_OR_DEPARTMENT_OTHER): Payer: Medicare Other | Admitting: Lab

## 2011-07-12 VITALS — BP 112/75 | HR 103 | Temp 98.2°F | Ht 69.0 in | Wt 172.0 lb

## 2011-07-12 DIAGNOSIS — I2699 Other pulmonary embolism without acute cor pulmonale: Secondary | ICD-10-CM

## 2011-07-12 DIAGNOSIS — R894 Abnormal immunological findings in specimens from other organs, systems and tissues: Secondary | ICD-10-CM

## 2011-07-12 DIAGNOSIS — I82409 Acute embolism and thrombosis of unspecified deep veins of unspecified lower extremity: Secondary | ICD-10-CM

## 2011-07-12 DIAGNOSIS — Z86711 Personal history of pulmonary embolism: Secondary | ICD-10-CM

## 2011-07-12 MED ORDER — RIVAROXABAN 20 MG PO TABS: 20.0000 mg | ORAL_TABLET | Freq: Every day | ORAL | Status: DC

## 2011-07-12 NOTE — Progress Notes (Signed)
This office note has been dictated.

## 2011-07-13 ENCOUNTER — Telehealth: Payer: Self-pay

## 2011-07-13 LAB — PROTEIN C ACTIVITY: Protein C Activity: 200 % — ABNORMAL HIGH (ref 75–133)

## 2011-07-13 LAB — PROTEIN S, TOTAL: Protein S Total: 91 % (ref 60–150)

## 2011-07-13 NOTE — Telephone Encounter (Signed)
Message copied by Annett Fabian on Thu Jul 13, 2011 11:21 AM ------      Message from: Stan Head E      Created: Thu Jul 13, 2011  9:49 AM      Regarding: ? schedule EGD and colonoscopy       Please see my last note and Dr. Gustavo Lah last 2            He has been switched to Xarelto and could schedule EGD and colonoscopy to be done - HOLD  XARELTO 2 days before            Let me know if ?'s and if he wants to do office visit first again that's ok but does not seem necessary

## 2011-07-13 NOTE — Progress Notes (Signed)
CC:   Douglas Ora, MD  DIAGNOSES: 1. Pulmonary embolism/deep vein thrombosis. 2. Anticardiolipin antibody positivity.  CURRENT THERAPY: 1. Xarelto 20 mg p.o. daily. 2. Aspirin 81 mg p.o. daily.  INTERIM HISTORY:  Douglas Mercer comes in for his followup.  We first saw back in April.  We did a workup on his hypercoagulability.  Surprising enough, we did find that he has a positive anticardiolipin antibody.  He has a markedly elevated IgG beta 2 glycoprotein.  He has an elevated anticardiolipin IgG.  His lupus anticoagulant is also positive.  He is on Xarelto now.  He is having no problems with Xarelto.  I am going to start him on some aspirin.  He has had no problems with cough or shortness of breath.  He has had no fevers.  There has been no bony pain.  There have been no rashes.  PHYSICAL EXAMINATION:  This is a well-developed, well-nourished white gentleman in no obvious distress.  Vital signs:  Temperature 98.2, pulse 103, respiratory rate 18, blood pressure 112/75.  Weight is 172.  Head and neck:  Normocephalic, atraumatic skull.  There are no ocular or oral lesions.  No palpable cervical or supraclavicular lymph nodes.  Lungs: Clear bilaterally.  Cardiac:  Regular rate and rhythm with a normal S1 and S2.  There are no murmurs, rubs or bruits.  Abdomen:  Soft with good bowel sounds.  There is no palpable abdominal mass.  There is no palpable hepatosplenomegaly.  Back:  No tenderness over the spine, ribs, or hips.  Extremities:  No clubbing, cyanosis or edema.  Neurologic:  No focal neurological deficits.  Skin: No rashes, ecchymosis or petechia.  LABORATORY STUDIES:  Not done this visit.  IMPRESSION:  Douglas Mercer is a 66 year old gentleman with positive anticardiolipin  antibodies.  He also has a positive lupus anticoagulant.  He has a history of a pulmonary embolism back in 1992. He also was found to have a DVT of the left leg.  We will add the aspirin to the Xarelto.  I  think this is a good idea.  I will plan to get him back to see me in another 2 months.  This, I think, would be reasonable.  I suspect he is probably going to need anticoagulation for life.  He definitely has factors for hypercoagulability.  Again, will see him back in 2 months.    ______________________________ Josph Macho, M.D. PRE/MEDQ  D:  07/12/2011  T:  07/13/2011  Job:  [id number]

## 2011-07-13 NOTE — Telephone Encounter (Signed)
Patient is agreeable to schedule the procedures directly, but he is on his way to [place] now.  He wants to call back next week to schedule.  I will mail him a letter reminding him to give me a call when he gets home, and I have sent myself a reminder to call him if he forgets for 6/10

## 2011-07-14 ENCOUNTER — Other Ambulatory Visit: Payer: Medicare Other | Admitting: Lab

## 2011-07-14 ENCOUNTER — Ambulatory Visit: Payer: Medicare Other | Admitting: Hematology & Oncology

## 2011-07-18 ENCOUNTER — Telehealth: Payer: Self-pay | Admitting: Internal Medicine

## 2011-07-18 NOTE — Telephone Encounter (Signed)
Colon/Endo scheduled for 08/01/11 2:30, pre-visit scheduled for 07/25/11.  He is advised that he will need to hold his xeralto for 2 days prior to the procedures

## 2011-07-25 ENCOUNTER — Ambulatory Visit (AMBULATORY_SURGERY_CENTER): Payer: Medicare Other

## 2011-07-25 VITALS — Ht 70.0 in | Wt 173.2 lb

## 2011-07-25 DIAGNOSIS — R05 Cough: Secondary | ICD-10-CM

## 2011-07-25 DIAGNOSIS — Z1211 Encounter for screening for malignant neoplasm of colon: Secondary | ICD-10-CM

## 2011-07-25 DIAGNOSIS — Z860100 Personal history of colon polyps, unspecified: Secondary | ICD-10-CM

## 2011-07-25 DIAGNOSIS — Z800 Family history of malignant neoplasm of digestive organs: Secondary | ICD-10-CM

## 2011-07-25 DIAGNOSIS — R059 Cough, unspecified: Secondary | ICD-10-CM

## 2011-07-25 MED ORDER — PEG-KCL-NACL-NASULF-NA ASC-C 100 G PO SOLR: 1.0000 | Freq: Once | ORAL | Status: AC

## 2011-08-01 ENCOUNTER — Ambulatory Visit (AMBULATORY_SURGERY_CENTER): Payer: Medicare Other | Admitting: Internal Medicine

## 2011-08-01 ENCOUNTER — Encounter: Payer: Medicare Other | Admitting: Internal Medicine

## 2011-08-01 ENCOUNTER — Telehealth: Payer: Self-pay | Admitting: Internal Medicine

## 2011-08-01 ENCOUNTER — Encounter: Payer: Self-pay | Admitting: Internal Medicine

## 2011-08-01 VITALS — BP 119/78 | HR 88 | Temp 96.9°F | Resp 16 | Ht 70.0 in | Wt 173.0 lb

## 2011-08-01 DIAGNOSIS — K2960 Other gastritis without bleeding: Secondary | ICD-10-CM

## 2011-08-01 DIAGNOSIS — R059 Cough, unspecified: Secondary | ICD-10-CM

## 2011-08-01 DIAGNOSIS — D6861 Antiphospholipid syndrome: Secondary | ICD-10-CM | POA: Insufficient documentation

## 2011-08-01 DIAGNOSIS — K259 Gastric ulcer, unspecified as acute or chronic, without hemorrhage or perforation: Secondary | ICD-10-CM

## 2011-08-01 DIAGNOSIS — K219 Gastro-esophageal reflux disease without esophagitis: Secondary | ICD-10-CM

## 2011-08-01 DIAGNOSIS — Z1211 Encounter for screening for malignant neoplasm of colon: Secondary | ICD-10-CM

## 2011-08-01 DIAGNOSIS — Z860100 Personal history of colon polyps, unspecified: Secondary | ICD-10-CM

## 2011-08-01 DIAGNOSIS — D126 Benign neoplasm of colon, unspecified: Secondary | ICD-10-CM

## 2011-08-01 DIAGNOSIS — R05 Cough: Secondary | ICD-10-CM

## 2011-08-01 MED ORDER — SODIUM CHLORIDE 0.9 % IV SOLN: 500.0000 mL | INTRAVENOUS | Status: DC

## 2011-08-01 NOTE — Patient Instructions (Addendum)
There were some erosions (tiny ulcers) in the stomach but the esophagus and duodenum were normal. Not sure why you have the cough after eating sometimes. I took a biopsy to see if you have an infection causing the erosions and will let you know the results and treatment plans. I removed one tiny polyp from the colon - next colonoscopy (routine) in about 5 years. Restart Xarelto today. Iva Boop, MD, FACG  YOU HAD AN ENDOSCOPIC PROCEDURE TODAY AT THE [facility]: Refer to the procedure report that was given to you for any specific questions about what was found during the examination.  If the procedure report does not answer your questions, please call your gastroenterologist to clarify.  If you requested that your care partner not be given the details of your procedure findings, then the procedure report has been included in a sealed envelope for you to review at your convenience later.  YOU SHOULD EXPECT: Some feelings of bloating in the abdomen. Passage of more gas than usual.  Walking can help get rid of the air that was put into your GI tract during the procedure and reduce the bloating. If you had a lower endoscopy (such as a colonoscopy or flexible sigmoidoscopy) you may notice spotting of blood in your stool or on the toilet paper. If you underwent a bowel prep for your procedure, then you may not have a normal bowel movement for a few days.  DIET: Your first meal following the procedure should be a light meal and then it is ok to progress to your normal diet.  A half-sandwich or bowl of soup is an example of a good first meal.  Heavy or fried foods are harder to digest and may make you feel nauseous or bloated.  Likewise meals heavy in dairy and vegetables can cause extra gas to form and this can also increase the bloating.  Drink plenty of fluids but you should avoid alcoholic beverages for 24 hours.  ACTIVITY: Your care partner should take you home directly after the  procedure.  You should plan to take it easy, moving slowly for the rest of the day.  You can resume normal activity the day after the procedure however you should NOT DRIVE or use heavy machinery for 24 hours (because of the sedation medicines used during the test).    SYMPTOMS TO REPORT IMMEDIATELY: A gastroenterologist can be reached at any hour.  During normal business hours, 8:30 AM to 5:00 PM Monday through Friday, call [phone].  After hours and on weekends, please call the GI answering service at [phone] who will take a message and have the physician on call contact you.   Following lower endoscopy (colonoscopy or flexible sigmoidoscopy):  Excessive amounts of blood in the stool  Significant tenderness or worsening of abdominal pains  Swelling of the abdomen that is new, acute  Fever of 100F or higher  Following upper endoscopy (EGD)  Vomiting of blood or coffee ground material  New chest pain or pain under the shoulder blades  Painful or persistently difficult swallowing  New shortness of breath  Fever of 100F or higher  Black, tarry-looking stools  FOLLOW UP: If any biopsies were taken you will be contacted by phone or by letter within the next 1-3 weeks.  Call your gastroenterologist if you have not heard about the biopsies in 3 weeks.  Our staff will call the home number listed on your records the next business day following your procedure  to check on you and address any questions or concerns that you may have at that time regarding the information given to you following your procedure. This is a courtesy call and so if there is no answer at the home number and we have not heard from you through the emergency physician on call, we will assume that you have returned to your regular daily activities without incident.  SIGNATURES/CONFIDENTIALITY: You and/or your care partner have signed paperwork which will be entered into your electronic medical record.  These  signatures attest to the fact that that the information above on your After Visit Summary has been reviewed and is understood.  Full responsibility of the confidentiality of this discharge information lies with you and/or your care-partner.   Please follow all discharge instructions given to you by the recovery room nurse. If you have any questions or problems after discharge please call one of the numbers listed above. You will receive a phone call in the am to see how you are doing and answer any questions you may have. Thank you for choosing [facility] for your health care needs.

## 2011-08-01 NOTE — Op Note (Signed)
[facility] [address] [place], [place]  [address]  COLONOSCOPY PROCEDURE REPORT  PATIENT:  Douglas Mercer, Douglas Mercer  MR#:  [id number] BIRTHDATE:  02-06-1946, 66 yrs. old  GENDER:  male ENDOSCOPIST:  Iva Boop, MD, [facility]  PROCEDURE DATE:  08/01/2011 PROCEDURE:  Colonoscopy with snare polypectomy ASA CLASS:  Class II INDICATIONS:  surveillance and high-risk screening, history of pre-cancerous (adenomatous) colon polyps adenomas 2003, 2004 and 2008 MEDICATIONS:   There was residual sedation effect present from prior procedure., These medications were titrated to patient response per physician's verbal order, MAC sedation, administered by CRNA, propofol (Diprivan) 180 mg IV  DESCRIPTION OF PROCEDURE:   After the risks benefits and alternatives of the procedure were thoroughly explained, informed consent was obtained.  Digital rectal exam was performed and revealed no abnormalities and normal prostate.   The LB CF-[id number] endoscope was introduced through the anus and advanced to the cecum, which was identified by both the appendix and ileocecal valve, without limitations.  The quality of the prep was excellent, using MoviPrep.  The instrument was then slowly withdrawn as the colon was fully examined. <<PROCEDUREIMAGES>>  FINDINGS:  A diminutive polyp was found in the mid transverse colon. 4 mm polyp, cold snare removal.  This was otherwise a normal examination of the colon. Includes right colon retroflexion.   Retroflexed views in the rectum revealed internal hemorrhoids.    The time to cecum = 2:22 minutes. The scope was then withdrawn in 10:56 minutes from the cecum and the procedure completed. COMPLICATIONS:  None ENDOSCOPIC IMPRESSION: 1) Diminutive polyp in the mid transverse colon - removed 2) Internal hemorrhoids 3) Otherwise normal examination - excellent prep 4) Previous adenomas RECOMMENDATIONS: 1) Resume Xarelto today REPEAT EXAM:  In 5 year(s)  for routine screening colonoscopy.  Iva Boop, MD, [facility]  CC:  Willow Ora, MD and The Patient  n. eSIGNED:   Iva Boop at 08/01/2011 03:07 PM  [place], Fayrene Fearing, [phone]

## 2011-08-01 NOTE — Progress Notes (Signed)
Patient did not experience any of the following events: a burn prior to discharge; a fall within the facility; wrong site/side/patient/procedure/implant event; or a hospital transfer or hospital admission upon discharge from the facility. (G8907) Patient did not have preoperative order for IV antibiotic SSI prophylaxis. (G8918)  

## 2011-08-01 NOTE — Telephone Encounter (Deleted)
Opened in error

## 2011-08-01 NOTE — Op Note (Signed)
[facility] [address] [place], [place]  [address]  ENDOSCOPY PROCEDURE REPORT  PATIENT:  Douglas, Mercer  MR#:  [id number] BIRTHDATE:  [birth year], 66 yrs. old  GENDER:  male  ENDOSCOPIST:  Iva Boop, MD, [facility]  PROCEDURE DATE:  08/01/2011 PROCEDURE:  EGD with biopsy for H. pylori 19147 ASA CLASS:  Class II INDICATIONS:  ? of GERD-induced cough  MEDICATIONS:   These medications were titrated to patient response per physician's verbal order, MAC sedation, administered by CRNA, propofol (Diprivan) 100 mg IV TOPICAL ANESTHETIC:  none  DESCRIPTION OF PROCEDURE:   After the risks benefits and alternatives of the procedure were thoroughly explained, informed consent was obtained.  The LB-GIF Q180 [id number] endoscope was introduced through the mouth and advanced to the second portion of the duodenum, without limitations.  The instrument was slowly withdrawn as the mucosa was fully examined. <<PROCEDUREIMAGES>>  Multiple erosions were found in the antrum. A few erosions seen. A biopsy for H. pylori was taken.  Otherwise the examination was normal. Slightly irregular z-line.    Retroflexed views revealed no abnormalities.    The scope was then withdrawn from the patient and the procedure completed.  COMPLICATIONS:  None  ENDOSCOPIC IMPRESSION: 1) Erosions, multiple in the antrum 2) Otherwise normal examination RECOMMENDATIONS: 1) Await biopsy results 2) Treat H. pylori if CLO + 3) Consider regular acid-suppression 4) Colonoscopy next  Iva Boop, MD, [facility]  CC:  Willow Ora, MD, The Patient  n. eSIGNED:   Iva Boop at 08/01/2011 02:52 PM  [place], Fayrene Fearing, [phone]

## 2011-08-02 ENCOUNTER — Telehealth: Payer: Self-pay | Admitting: *Deleted

## 2011-08-02 LAB — HELICOBACTER PYLORI SCREEN-BIOPSY: UREASE: NEGATIVE

## 2011-08-02 NOTE — Telephone Encounter (Signed)
  Follow up Call-  Call back number 08/01/2011  Post procedure Call Back phone  # [phone]  Permission to leave phone message Yes     Patient questions:  Do you have a fever, pain , or abdominal swelling? no Pain Score  0 *  Have you tolerated food without any problems? yes  Have you been able to return to your normal activities? yes  Do you have any questions about your discharge instructions: Diet   no Medications  no Follow up visit  no  Do you have questions or concerns about your Care? no  Actions: * If pain score is 4 or above: No action needed, pain <4.

## 2011-08-07 ENCOUNTER — Encounter: Payer: Self-pay | Admitting: Internal Medicine

## 2011-08-07 ENCOUNTER — Other Ambulatory Visit: Payer: Self-pay | Admitting: Internal Medicine

## 2011-08-07 DIAGNOSIS — R05 Cough: Secondary | ICD-10-CM | POA: Insufficient documentation

## 2011-08-07 DIAGNOSIS — R059 Cough, unspecified: Secondary | ICD-10-CM | POA: Insufficient documentation

## 2011-08-07 MED ORDER — PANTOPRAZOLE SODIUM 40 MG PO TBEC: 40.0000 mg | DELAYED_RELEASE_TABLET | Freq: Every day | ORAL | Status: DC

## 2011-08-07 NOTE — Progress Notes (Signed)
Quick Note:  Office  Call him and let him know that polyp benign - repeat colon 2018 Also I rxed pantoprazole 40 mg daily for gastritis and GERD (H pylori was negative)  He should see me in September to see if cough is gone  [facility]  5 year 07/2016 colon recall No letter ______

## 2011-08-07 NOTE — Progress Notes (Signed)
Quick Note:  Diminutive colon adenoma - repeat colonoscopy about 07/2016 H. Pylori negative ______

## 2011-08-10 ENCOUNTER — Other Ambulatory Visit: Payer: Self-pay

## 2011-08-10 ENCOUNTER — Other Ambulatory Visit: Payer: Self-pay | Admitting: Internal Medicine

## 2011-08-10 MED ORDER — PANTOPRAZOLE SODIUM 40 MG PO TBEC: 40.0000 mg | DELAYED_RELEASE_TABLET | Freq: Every day | ORAL | Status: DC

## 2011-08-10 NOTE — Telephone Encounter (Signed)
Protonix Rx sent to [facility] per pt request and Rx sent to Mail order by mistake, called them and spoke to [place] who will cancel that order.

## 2011-08-14 ENCOUNTER — Ambulatory Visit (INDEPENDENT_AMBULATORY_CARE_PROVIDER_SITE_OTHER): Payer: Medicare Other | Admitting: Internal Medicine

## 2011-08-14 ENCOUNTER — Encounter: Payer: Self-pay | Admitting: *Deleted

## 2011-08-14 VITALS — BP 122/80 | HR 85 | Temp 98.1°F | Wt 170.0 lb

## 2011-08-14 DIAGNOSIS — D6859 Other primary thrombophilia: Secondary | ICD-10-CM

## 2011-08-14 DIAGNOSIS — D6861 Antiphospholipid syndrome: Secondary | ICD-10-CM

## 2011-08-14 DIAGNOSIS — E119 Type 2 diabetes mellitus without complications: Secondary | ICD-10-CM

## 2011-08-14 LAB — BASIC METABOLIC PANEL
BUN: 20 mg/dL (ref 6–23)
CO2: 29 mEq/L (ref 19–32)
Chloride: 104 mEq/L (ref 96–112)
GFR: 65.12 mL/min (ref >60.00)
Glucose, Bld: 112 mg/dL — ABNORMAL HIGH (ref 70–99)
Potassium: 4.6 mEq/L (ref 3.5–5.1)
Sodium: 139 mEq/L (ref 135–145)

## 2011-08-14 NOTE — Assessment & Plan Note (Signed)
Doing well, check a A1C

## 2011-08-14 NOTE — Progress Notes (Signed)
  Subjective:    Patient ID: Douglas Mercer, male    DOB: [birth year], 66 y.o.   MRN: [id number]  HPI Routine office visit Doing well, good medication compliance, he stop Coumadin and is now on xarelto No ambulatory BPs but BP okay every time he goes to a doctor. CBGs around 130, no symptoms consistent with low blood sugars.   Past Medical History: Diabetes   hyperlipidemia Allergic rhinitis h/o BPH Pulmonary embolism, hx of (1992), Bilateral with PE, reportedly (+) Lupus anticoagulant large left lower extremity DVT 02/2009 ED Rosacea neg stress test in [place] (aprox 2005)--per pt  Past Surgical History: Inguinal herniorrhaphy (right-sided, 1995, mesh placed ) Tonsillectomy 07-2009: --Transurethral resection of the prostate performed on August 13, 2009.   --Right inguinal hernia repair with mesh (laparoscopic).    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 No nosebleeds or blood in the stools.     Objective:   Physical Exam General -- alert, well-developed, and well-nourished.   Lungs -- normal respiratory effort, no intercostal retractions, no accessory muscle use, and normal breath sounds.   Heart-- normal rate, regular rhythm, no murmur, and no gallop.   Extremities-- no pretibial edema bilaterally Psych-- Cognition and judgment appear intact. Alert and cooperative with normal attention span and concentration.  not anxious appearing and not depressed appearing.       Assessment & Plan:

## 2011-08-14 NOTE — Assessment & Plan Note (Signed)
On xarelto, doing well

## 2011-08-15 ENCOUNTER — Encounter: Payer: Self-pay | Admitting: Internal Medicine

## 2011-09-11 ENCOUNTER — Ambulatory Visit (HOSPITAL_BASED_OUTPATIENT_CLINIC_OR_DEPARTMENT_OTHER): Payer: Medicare Other | Admitting: Hematology & Oncology

## 2011-09-11 ENCOUNTER — Ambulatory Visit (HOSPITAL_BASED_OUTPATIENT_CLINIC_OR_DEPARTMENT_OTHER): Payer: Medicare Other | Admitting: Lab

## 2011-09-11 DIAGNOSIS — Z7901 Long term (current) use of anticoagulants: Secondary | ICD-10-CM

## 2011-09-11 DIAGNOSIS — I82409 Acute embolism and thrombosis of unspecified deep veins of unspecified lower extremity: Secondary | ICD-10-CM

## 2011-09-11 DIAGNOSIS — I2699 Other pulmonary embolism without acute cor pulmonale: Secondary | ICD-10-CM

## 2011-09-11 DIAGNOSIS — D6861 Antiphospholipid syndrome: Secondary | ICD-10-CM

## 2011-09-11 DIAGNOSIS — D6859 Other primary thrombophilia: Secondary | ICD-10-CM

## 2011-09-11 LAB — CBC WITH DIFFERENTIAL (CANCER CENTER ONLY)
BASO#: 0 10*3/uL (ref 0.0–0.2)
BASO%: 0.2 % (ref 0.0–2.0)
EOS%: 0.6 % (ref 0.0–7.0)
HCT: 41.6 % (ref 38.7–49.9)
HGB: 14.6 g/dL (ref 13.0–17.1)
LYMPH#: 1 10*3/uL (ref 0.9–3.3)
LYMPH%: 20.5 % (ref 14.0–48.0)
MCH: 32.9 pg (ref 28.0–33.4)
MCHC: 35.1 g/dL (ref 32.0–35.9)
MCV: 94 fL (ref 82–98)
NEUT%: 70.9 % (ref 40.0–80.0)
RDW: 13.3 % (ref 11.1–15.7)

## 2011-09-11 NOTE — Progress Notes (Signed)
This office note has been dictated.

## 2011-09-12 NOTE — Progress Notes (Signed)
CC:   Douglas Ora, MD  DIAGNOSIS: 1. Pulmonary embolism/deep venous thrombosis. 2. Anticardiolipin antibody positive.  CURRENT THERAPY:  Xarelto 20 mg p.o. daily, aspirin 81 mg p.o. daily.  INTERIM HISTORY:  Douglas Mercer comes in for his followup.  He is doing well.  He has had no problems with Xarelto and aspirin combination.  I added aspirin because of the anticardiolipin antibody positivity.  He has had no cough or shortness of breath.  He has had no bleeding. There has been no leg swelling.  He has had no rashes.  He has had no headache.  PHYSICAL EXAMINATION:  General: This is a well-developed, well- nourished, white gentleman in no obvious distress.  Vital signs: Temperature of 98.5, pulse 100, respiratory rate 20, blood pressure 124/71.  Weight is 171.  Head and neck:  Normocephalic, atraumatic skull.  There are no ocular or oral lesions.  No palpable cervical or supraclavicular lymph nodes.  Lungs:  Clear bilaterally.  Cardiac: Regular rate and rhythm with a normal S1, S2.  There are no murmurs, rubs or bruits.  Abdomen:  Soft with good bowel sounds.  There is no palpable abdominal mass.  There is no fluid wave.  There is no palpable hepatosplenomegaly.  Back:  No tenderness over the spine, ribs, or hips. Extremities:  Shows no clubbing, cyanosis or edema.  Neurological: Shows no focal neurological deficits.  LABORATORY STUDIES:  White cell count is 4.9, hemoglobin 14.6, hematocrit 41.6, platelet count 202. Protein C is 117%, protein S is 128%.  IMPRESSION:  Douglas Mercer is a 66 year old gentleman with anticardiolipin antibody syndrome.  He has a recurrence, deep venous thrombosis and pulmonary embolism.  Given the fact that he does have recurrence plus these thrombophilic abnormalities, he definitely will need lifelong anticoagulation.  When we last saw him in May, his D-dimer was 0.22.  We are checking this again. I do not see any indication for additional studies  right now. I think we could probably get him back in 4 months' time for followup.    ______________________________ Josph Macho, M.D. PRE/MEDQ  D:  09/11/2011  T:  09/12/2011  Job:  [id number]

## 2011-11-10 ENCOUNTER — Other Ambulatory Visit: Payer: Self-pay | Admitting: Internal Medicine

## 2011-11-10 DIAGNOSIS — Z7901 Long term (current) use of anticoagulants: Secondary | ICD-10-CM

## 2011-11-10 DIAGNOSIS — I82409 Acute embolism and thrombosis of unspecified deep veins of unspecified lower extremity: Secondary | ICD-10-CM

## 2011-11-10 DIAGNOSIS — I868 Varicose veins of other specified sites: Secondary | ICD-10-CM

## 2011-11-10 DIAGNOSIS — E119 Type 2 diabetes mellitus without complications: Secondary | ICD-10-CM

## 2011-11-10 DIAGNOSIS — L719 Rosacea, unspecified: Secondary | ICD-10-CM

## 2011-11-10 DIAGNOSIS — E785 Hyperlipidemia, unspecified: Secondary | ICD-10-CM

## 2011-11-10 MED ORDER — GLIPIZIDE ER 2.5 MG PO TB24: 2.5000 mg | ORAL_TABLET | Freq: Every day | ORAL | Status: DC

## 2011-11-10 MED ORDER — FENOFIBRATE 160 MG PO TABS: 160.0000 mg | ORAL_TABLET | Freq: Every day | ORAL | Status: DC

## 2011-11-10 MED ORDER — FLUTICASONE PROPIONATE 50 MCG/ACT NA SUSP: 2.0000 | Freq: Every day | NASAL | Status: DC

## 2011-11-10 MED ORDER — LISINOPRIL 5 MG PO TABS: 5.0000 mg | ORAL_TABLET | Freq: Every day | ORAL | Status: DC

## 2011-11-10 MED ORDER — METFORMIN HCL 850 MG PO TABS: ORAL_TABLET | ORAL | Status: DC

## 2011-11-10 NOTE — Telephone Encounter (Signed)
refills x 5 last ov 6.24.13 53-month follow up   1-Fluticasone SPR 50 MCG Suspension  Place 2 sprays into the nose daily, 90day supply  Last wrt 1.2.13  2-MetFORMIN (Tab)  850 MG TAKE 1 TABLET BY MOUTH TWICE DAILY WITH MEALS, 90-day supply  Last wrt. 4.12.13  3-Lisinopril (Tab) 5 MG Take 1 tablet (5 mg total) by mouth daily wants 90-day supply Last wrt 4.12.13  4-Fenofibrate (Tab) 160 MG Take 1 tablet (160 mg total) by mouth daily. 90-day supply Last wrt 4.12.13  5-GlipiZIDE XL TAB SR 2.5 MG Take 1 tablet (2.5 mg total) by mouth daily. 90-day supply Last wrt 4.12.13

## 2011-11-10 NOTE — Telephone Encounter (Signed)
Refill done.  

## 2011-11-15 ENCOUNTER — Encounter: Payer: Self-pay | Admitting: Internal Medicine

## 2011-11-20 NOTE — Telephone Encounter (Signed)
2nd request for Lisinopril, fenofibrate glipizide xl tab, can you review and re-send if necessary please

## 2011-11-20 NOTE — Telephone Encounter (Signed)
Spoke with pt & he states that he does not need refills. Refill request sent in error.

## 2011-11-20 NOTE — Telephone Encounter (Signed)
LMOVM for pt to return call 

## 2011-12-07 ENCOUNTER — Ambulatory Visit (INDEPENDENT_AMBULATORY_CARE_PROVIDER_SITE_OTHER): Payer: Medicare Other

## 2011-12-07 DIAGNOSIS — Z23 Encounter for immunization: Secondary | ICD-10-CM

## 2011-12-19 ENCOUNTER — Ambulatory Visit (INDEPENDENT_AMBULATORY_CARE_PROVIDER_SITE_OTHER): Payer: Medicare Other | Admitting: Internal Medicine

## 2011-12-19 ENCOUNTER — Encounter: Payer: Self-pay | Admitting: Internal Medicine

## 2011-12-19 VITALS — BP 100/60 | HR 96 | Ht 68.25 in | Wt 173.0 lb

## 2011-12-19 DIAGNOSIS — R05 Cough: Secondary | ICD-10-CM

## 2011-12-19 DIAGNOSIS — K219 Gastro-esophageal reflux disease without esophagitis: Secondary | ICD-10-CM

## 2011-12-19 MED ORDER — PANTOPRAZOLE SODIUM 40 MG PO TBEC: 40.0000 mg | DELAYED_RELEASE_TABLET | Freq: Two times a day (BID) | ORAL | Status: DC

## 2011-12-19 NOTE — Patient Instructions (Addendum)
We have sent the following medications to your pharmacy for you to pick up at your convenience: Pantoprazole  We are also giving you a printed rx for the pantoprazole to send to your mail order pharmacy.  Increase your dose to twice a day taking the pantoprazole.  Please follow-up with [place] in 3 months.  Thank you for choosing me and [facility].  Iva Boop, M.D., [facility]

## 2011-12-19 NOTE — Progress Notes (Addendum)
  Subjective:    Patient ID: Douglas Mercer, male    DOB: Jun 13, 1945, 66 y.o.   MRN: [id number]  HPI Here to discuss and follow-up on cough thought possibly due to GERD. Still has cough after eating and in between eating at times. It is dry. It is less frequent, however. No heartburn, no dysphagia. Weight is stable.  Medications, allergies, past medical history, past surgical history, family history and social history are reviewed and updated in the EMR.  Review of Systems Otherwise well    Objective:   Physical Exam NAD     Assessment & Plan:   1. GERD (gastroesophageal reflux disease)   2. Cough    Working diagnosis is cough due to GERD. CXR was unrevealing before. He is better with acid suppression so will go to bid PPI to see what response that gives and then return in about 3 months.  If persists consider further evaluation by pulmonary or perhaps impedance monitoring.

## 2011-12-28 ENCOUNTER — Other Ambulatory Visit (HOSPITAL_BASED_OUTPATIENT_CLINIC_OR_DEPARTMENT_OTHER): Payer: Medicare Other | Admitting: Lab

## 2011-12-28 ENCOUNTER — Ambulatory Visit (HOSPITAL_BASED_OUTPATIENT_CLINIC_OR_DEPARTMENT_OTHER): Payer: Medicare Other | Admitting: Hematology & Oncology

## 2011-12-28 VITALS — BP 119/62 | HR 96 | Temp 98.0°F | Resp 18 | Ht 68.0 in | Wt 176.0 lb

## 2011-12-28 DIAGNOSIS — D6859 Other primary thrombophilia: Secondary | ICD-10-CM

## 2011-12-28 DIAGNOSIS — I2699 Other pulmonary embolism without acute cor pulmonale: Secondary | ICD-10-CM

## 2011-12-28 DIAGNOSIS — I82409 Acute embolism and thrombosis of unspecified deep veins of unspecified lower extremity: Secondary | ICD-10-CM

## 2011-12-28 DIAGNOSIS — D6861 Antiphospholipid syndrome: Secondary | ICD-10-CM

## 2011-12-28 LAB — CBC WITH DIFFERENTIAL (CANCER CENTER ONLY)
BASO#: 0 10*3/uL (ref 0.0–0.2)
Eosinophils Absolute: 0.1 10*3/uL (ref 0.0–0.5)
HCT: 45.1 % (ref 38.7–49.9)
HGB: 15.7 g/dL (ref 13.0–17.1)
MCH: 32.5 pg (ref 28.0–33.4)
MONO%: 6.3 % (ref 0.0–13.0)
NEUT#: 4.7 10*3/uL (ref 1.5–6.5)
RBC: 4.83 10*6/uL (ref 4.20–5.70)

## 2011-12-28 MED ORDER — RIVAROXABAN 20 MG PO TABS: 20.0000 mg | ORAL_TABLET | Freq: Every day | ORAL | Status: DC

## 2011-12-28 NOTE — Addendum Note (Signed)
Addended by: Arlan Organ R on: 12/28/2011 02:30 PM   Modules accepted: Orders

## 2011-12-28 NOTE — Progress Notes (Signed)
This office note has been dictated.

## 2011-12-29 LAB — LUPUS ANTICOAGULANT PANEL
DRVVT: 170.4 secs — ABNORMAL HIGH (ref <42.9)
Drvvt confirmation: 3.33 Ratio — ABNORMAL HIGH (ref <1.11)
Lupus Anticoagulant: DETECTED — AB
PTTLA Confirmation: 19.6 secs — ABNORMAL HIGH (ref <8.0)

## 2011-12-29 NOTE — Progress Notes (Signed)
CC:   Willow Ora, MD  DIAGNOSES: 1. Anticardiolipin antibody syndrome. 2. Pulmonary embolism/deep venous thrombosis.  CURRENT THERAPY: 1. Xarelto 20 mg p.o. daily. 2. Aspirin 81 mg p.o. daily.  INTERIM HISTORY:  Mr. Solazzo comes in for his followup.  He is feeling pretty well.  He has had no bleeding.  He has had no problems with cough or shortness of breath.  He has had no chest wall pain.  He has had no leg swelling.  He has had no problems with bowels or bladder.  There has been no skin rash.  He has had no significant fatigue or weakness.  When we last saw him back in July his D-dimer was 0.27.  PHYSICAL EXAMINATION:  General:  This is a well-developed, well- nourished white gentleman in no obvious distress.  Vital signs:  98, pulse 96, respiratory rate 18, blood pressure 119/60.  Weight is 176. Head and neck:  Shows a normocephalic, atraumatic skull.  There are no ocular or oral lesions.  There are no palpable cervical or supraclavicular lymph nodes.  Lungs:  Clear bilaterally.  Cardiac: Regular rate and rhythm with a normal S1 and S2.  There are no murmurs, rubs or bruits.  Abdomen:  Soft with good bowel sounds.  There is no palpable abdominal mass.  There is no palpable hepatosplenomegaly. Back:  No tenderness over the spine, ribs or hips.  Extremities:  Shows no clubbing, cyanosis or edema.  Neurological:  Shows no focal neurological deficits.  LABORATORY STUDIES:  White cell count is 6.2, hemoglobin 15.7, hematocrit 45.1, platelet count 201.  IMPRESSION:  Mr. Antolini is a 66 year old gentleman who is positive for the anticardiolipin antibody.  He has a markedly elevated IgG anticardiolipin antibody at 94.  His beta 2 glycoprotein IgG is 170. His other regular beta 2 glycoprotein levels are also elevated.  Again, we have him on lifelong Xarelto.  Aspirin also because of the positive anticardiolipin antibodies.  We will go ahead and plan to get him back now in  about 4 months.  I do not see any indication for any studies right now.  He has done quite well on the Xarelto.   ______________________________ Josph Macho, M.D. PRE/MEDQ  D:  12/28/2011  T:  12/29/2011  Job:  [id number]

## 2012-01-15 ENCOUNTER — Encounter: Payer: Self-pay | Admitting: Internal Medicine

## 2012-01-15 ENCOUNTER — Ambulatory Visit (INDEPENDENT_AMBULATORY_CARE_PROVIDER_SITE_OTHER): Payer: Medicare Other | Admitting: Internal Medicine

## 2012-01-15 VITALS — BP 118/72 | HR 90 | Temp 98.0°F | Wt 172.0 lb

## 2012-01-15 DIAGNOSIS — D6859 Other primary thrombophilia: Secondary | ICD-10-CM

## 2012-01-15 DIAGNOSIS — Z87898 Personal history of other specified conditions: Secondary | ICD-10-CM

## 2012-01-15 DIAGNOSIS — E119 Type 2 diabetes mellitus without complications: Secondary | ICD-10-CM

## 2012-01-15 DIAGNOSIS — D6861 Antiphospholipid syndrome: Secondary | ICD-10-CM

## 2012-01-15 LAB — BASIC METABOLIC PANEL
BUN: 19 mg/dL (ref 6–23)
Chloride: 103 mEq/L (ref 96–112)
Glucose, Bld: 125 mg/dL — ABNORMAL HIGH (ref 70–99)
Potassium: 4.5 mEq/L (ref 3.5–5.1)

## 2012-01-15 NOTE — Progress Notes (Signed)
  Subjective:    Patient ID: Douglas Mercer, male    DOB: 10/22/1945, 66 y.o.   MRN: [id number]  HPI Routine office visit, good compliance with medications, ambulatory BPs within normal, ambulatory CBGs ranged from 115-130  Past Medical History: Diabetes   hyperlipidemia Allergic rhinitis h/o BPH Pulmonary embolism, hx of (1992), Bilateral with PE, reportedly (+) Lupus anticoagulant large left lower extremity DVT 02/2009 ED Rosacea neg stress test in [place] (aprox 2005)--per pt  Past Surgical History: Inguinal herniorrhaphy (right-sided, 1995, mesh placed ) Tonsillectomy 07-2009: --Transurethral resection of the prostate performed on August 13, 2009.   --Right inguinal hernia repair with mesh (laparoscopic).    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 Had hematuria, status post urology workup, not further  hematuria.     Objective:   Physical Exam General -- alert, well-developed, and well-nourished.   Lungs -- normal respiratory effort, no intercostal retractions, no accessory muscle use, and normal breath sounds.   Heart-- normal rate, regular rhythm, no murmur, and no gallop.   Extremities-- no pretibial edema bilaterally Psych-- Cognition and judgment appear intact. Alert and cooperative with normal attention span and concentration.  not anxious appearing and not depressed appearing.       Assessment & Plan:

## 2012-01-15 NOTE — Assessment & Plan Note (Signed)
On xarelto,, seems to be tolerating well. Samples provided

## 2012-01-15 NOTE — Assessment & Plan Note (Addendum)
Seems to be well-controlled, check a A1c. 

## 2012-01-15 NOTE — Patient Instructions (Addendum)
Next visit in 4-5 months, physical exam, fasting

## 2012-01-15 NOTE — Assessment & Plan Note (Signed)
Reports recent episodes of gross hematuria, was evaluated by urology, CT and cystoscopy were performed around 10-2011

## 2012-01-19 ENCOUNTER — Encounter: Payer: Self-pay | Admitting: *Deleted

## 2012-01-28 ENCOUNTER — Other Ambulatory Visit: Payer: Self-pay | Admitting: Internal Medicine

## 2012-02-01 ENCOUNTER — Telehealth: Payer: Self-pay | Admitting: *Deleted

## 2012-02-01 DIAGNOSIS — I868 Varicose veins of other specified sites: Secondary | ICD-10-CM

## 2012-02-01 DIAGNOSIS — Z7901 Long term (current) use of anticoagulants: Secondary | ICD-10-CM

## 2012-02-01 DIAGNOSIS — E785 Hyperlipidemia, unspecified: Secondary | ICD-10-CM

## 2012-02-01 DIAGNOSIS — E119 Type 2 diabetes mellitus without complications: Secondary | ICD-10-CM

## 2012-02-01 DIAGNOSIS — I82409 Acute embolism and thrombosis of unspecified deep veins of unspecified lower extremity: Secondary | ICD-10-CM

## 2012-02-01 DIAGNOSIS — L719 Rosacea, unspecified: Secondary | ICD-10-CM

## 2012-02-01 NOTE — Telephone Encounter (Signed)
FYI Pt and wife are changing Part D [employer] Rx to [facility] mail order. Need script for following meds will pickup next week:  Doxycycline Hyclate 100mg  BID Lisinopril 5mg  QD Niaspan 1000mg  BID Fenofibrate 160mg  QD Glipizide XL 2.5mg  QD 1 Touch diabetic test strips Metformin 850mg  BID Fluticasone 1 puff each nostril Metronidazole Cream for Face AM and PM

## 2012-02-05 MED ORDER — GLUCOSE BLOOD VI STRP: ORAL_STRIP | Status: DC

## 2012-02-05 MED ORDER — NIACIN ER (ANTIHYPERLIPIDEMIC) 1000 MG PO TBCR: EXTENDED_RELEASE_TABLET | ORAL | Status: DC

## 2012-02-05 MED ORDER — GLIPIZIDE ER 2.5 MG PO TB24: 2.5000 mg | ORAL_TABLET | Freq: Every day | ORAL | Status: DC

## 2012-02-05 MED ORDER — FENOFIBRATE 160 MG PO TABS: 160.0000 mg | ORAL_TABLET | Freq: Every day | ORAL | Status: DC

## 2012-02-05 MED ORDER — METFORMIN HCL 850 MG PO TABS: ORAL_TABLET | ORAL | Status: DC

## 2012-02-05 MED ORDER — FLUTICASONE PROPIONATE 50 MCG/ACT NA SUSP: 2.0000 | Freq: Every day | NASAL | Status: AC

## 2012-02-05 MED ORDER — LISINOPRIL 5 MG PO TABS: 5.0000 mg | ORAL_TABLET | Freq: Every day | ORAL | Status: DC

## 2012-02-05 MED ORDER — DOXYCYCLINE HYCLATE 100 MG PO CAPS: 100.0000 mg | ORAL_CAPSULE | Freq: Two times a day (BID) | ORAL | Status: DC

## 2012-02-05 NOTE — Telephone Encounter (Signed)
Refill done. Pt picked up.

## 2012-03-19 ENCOUNTER — Ambulatory Visit: Payer: Medicare Other | Admitting: Internal Medicine

## 2012-03-21 ENCOUNTER — Ambulatory Visit (INDEPENDENT_AMBULATORY_CARE_PROVIDER_SITE_OTHER): Payer: Medicare Other | Admitting: Internal Medicine

## 2012-03-21 ENCOUNTER — Encounter: Payer: Self-pay | Admitting: Internal Medicine

## 2012-03-21 VITALS — BP 124/70 | HR 88 | Ht 68.25 in | Wt 171.1 lb

## 2012-03-21 DIAGNOSIS — K219 Gastro-esophageal reflux disease without esophagitis: Secondary | ICD-10-CM

## 2012-03-21 DIAGNOSIS — R05 Cough: Secondary | ICD-10-CM

## 2012-03-21 MED ORDER — PANTOPRAZOLE SODIUM 40 MG PO TBEC: 40.0000 mg | DELAYED_RELEASE_TABLET | Freq: Every day | ORAL | Status: DC

## 2012-03-21 NOTE — Patient Instructions (Signed)
Reduce pantoprazole to one a day. Come back if needed.

## 2012-03-21 NOTE — Progress Notes (Signed)
  Subjective:    Patient ID: Douglas Mercer, male    DOB: 07-16-1945, 67 y.o.   MRN: [id number]  HPI The patient returns to follow-up for cough thought related to GERD. 3 months ago was begun on bid pantoprazole and had a significant benefit with great reduction in cough early on in therapy. He feels well now.  Medications, allergies, past medical history, past surgical history, family history and social history are reviewed and updated in the EMR. Review of Systems As above    Objective:   Physical Exam WDWN NAD    Assessment & Plan:   1. Cough thought due to GERD   2. GERD (gastroesophageal reflux disease)    1. Doing well 2. Will see if he can reduce PPI to qd and maintain response - he is only remembering to take evening dose about half the time right now anyway 3. See me prn and refill PPI through PCP

## 2012-04-09 ENCOUNTER — Telehealth: Payer: Self-pay | Admitting: Hematology & Oncology

## 2012-04-09 NOTE — Telephone Encounter (Signed)
Informed patient of appointment time change on 04/25/12 and that they are now scheduled with Eunice Blase.  Patient confirmed this time will be ok.

## 2012-04-24 ENCOUNTER — Other Ambulatory Visit: Payer: Self-pay | Admitting: Medical

## 2012-04-25 ENCOUNTER — Ambulatory Visit: Payer: Medicare Other | Admitting: Hematology & Oncology

## 2012-04-25 ENCOUNTER — Other Ambulatory Visit (HOSPITAL_BASED_OUTPATIENT_CLINIC_OR_DEPARTMENT_OTHER): Payer: Medicare Other | Admitting: Lab

## 2012-04-25 ENCOUNTER — Ambulatory Visit (HOSPITAL_BASED_OUTPATIENT_CLINIC_OR_DEPARTMENT_OTHER): Payer: Medicare Other | Admitting: Medical

## 2012-04-25 ENCOUNTER — Other Ambulatory Visit: Payer: Medicare Other | Admitting: Lab

## 2012-04-25 VITALS — BP 110/59 | HR 92 | Temp 98.2°F | Resp 18 | Ht 68.0 in | Wt 169.0 lb

## 2012-04-25 LAB — CBC WITH DIFFERENTIAL (CANCER CENTER ONLY)
BASO#: 0 10*3/uL (ref 0.0–0.2)
BASO%: 0.2 % (ref 0.0–2.0)
Eosinophils Absolute: 0.1 10*3/uL (ref 0.0–0.5)
HCT: 44.3 % (ref 38.7–49.9)
HGB: 15 g/dL (ref 13.0–17.1)
LYMPH#: 0.9 10*3/uL (ref 0.9–3.3)
LYMPH%: 17.9 % (ref 14.0–48.0)
MCV: 95 fL (ref 82–98)
MONO#: 0.4 10*3/uL (ref 0.1–0.9)
NEUT%: 73.7 % (ref 40.0–80.0)
RBC: 4.67 10*6/uL (ref 4.20–5.70)
WBC: 5.1 10*3/uL (ref 4.0–10.0)

## 2012-04-25 NOTE — Progress Notes (Signed)
DIAGNOSES: 1. Anticardiolipin antibody syndrome. 2. Pulmonary embolism/deep venous thrombosis.  CURRENT THERAPY: 1. Xarelto 20 mg p.o. daily. 2. Aspirin 81 mg p.o. daily.  INTERIM HISTORY: [name] presents today for an office followup visit.  Overall, he, reports, that he is doing well.  He has no new complaints.  He's not reporting any obvious, or abnormal bleeding or bruising.  He's not reporting any problems with shortness of breath, or cough.  He has no chest wall pain.  He does not report any lower leg swelling.  His last.  D-dimer back in November was 0.27.  He, reports, he has a good appetite.  He denies any nausea, vomiting, diarrhea, constipation, any fevers, chills, or night sweats.  He denies any problems with bowel or bladder.  He denies any headaches, visual changes, or rashes.  He will continue on the Xarelto.  He will be on this lifelong, secondary to his positive.  Anticardiolipin antibodies.  Review of Systems: Constitutional:Negative for malaise/fatigue, fever, chills, weight loss, diaphoresis, activity change, appetite change, and unexpected weight change.  HEENT: Negative for double vision, blurred vision, visual loss, ear pain, tinnitus, congestion, rhinorrhea, epistaxis sore throat or sinus disease, oral pain/lesion, tongue soreness Respiratory: Negative for cough, chest tightness, shortness of breath, wheezing and stridor.  Cardiovascular: Negative for chest pain, palpitations, leg swelling, orthopnea, PND, DOE or claudication Gastrointestinal: Negative for nausea, vomiting, abdominal pain, diarrhea, constipation, blood in stool, melena, hematochezia, abdominal distention, anal bleeding, rectal pain, anorexia and hematemesis.  Genitourinary: Negative for dysuria, frequency, hematuria,  Musculoskeletal: Negative for myalgias, back pain, joint swelling, arthralgias and gait problem.  Skin: Negative for rash, color change, pallor and wound.  Neurological:. Negative for  dizziness/light-headedness, tremors, seizures, syncope, facial asymmetry, speech difficulty, weakness, numbness, headaches and paresthesias.  Hematological: Negative for adenopathy. Does not bruise/bleed easily.  Psychiatric/Behavioral:  Negative for depression, no loss of interest in normal activity or change in sleep pattern.   Physical Exam: This is a pleasant, 67 year old, well-developed, well-nourished, white gentleman, in no obvious distress Vitals: Temperature 98.2 degrees, pulse 92, respirations 18, blood pressure 110/59, weight 169 pounds HEENT reveals a normocephalic, atraumatic skull, no scleral icterus, no oral lesions  Neck is supple without any cervical or supraclavicular adenopathy.  Lungs are clear to auscultation bilaterally. There are no wheezes, rales or rhonci Cardiac is regular rate and rhythm with a normal S1 and S2. There are no murmurs, rubs, or bruits.  Abdomen is soft with good bowel sounds, there is no palpable mass. There is no palpable hepatosplenomegaly. There is no palpable fluid wave.  Musculoskeletal no tenderness of the spine, ribs, or hips.  Extremities there are no clubbing, cyanosis, or edema.  Skin no petechia, purpura or ecchymosis Neurologic is nonfocal.  Laboratory Data: White count 5.1, hemoglobin 15.0, hematocrit 44.3, platelets 192,000  Current Outpatient Prescriptions on File Prior to Visit  Medication Sig Dispense Refill  . aspirin 81 MG tablet Take 81 mg by mouth daily.      Marland Kitchen co-enzyme Q-10 30 MG capsule Take 100 mg by mouth every morning.       . fenofibrate 160 MG tablet Take 1 tablet (160 mg total) by mouth daily.  90 tablet  1  . fish oil-omega-3 fatty acids 1000 MG capsule Take 2 g by mouth daily.        Marland Kitchen glipiZIDE (GLUCOTROL XL) 2.5 MG 24 hr tablet Take 1 tablet (2.5 mg total) by mouth daily.  90 tablet  1  . glucose blood (  ONE TOUCH TEST STRIPS) test strip Use as instructed  100 each  6  . lisinopril (PRINIVIL,ZESTRIL) 5 MG tablet  Take 1 tablet (5 mg total) by mouth daily.  90 tablet  1  . metFORMIN (GLUCOPHAGE) 850 MG tablet TAKE 1 TABLET BY MOUTH TWICE DAILY WITH MEALS  180 tablet  1  . metroNIDAZOLE (METROCREAM) 0.75 % cream Nightly.       . Multiple Vitamin (MULTIVITAMIN) capsule Diabetic health Pack 6-7 pills a day      . niacin (NIASPAN) 1000 MG CR tablet TAKE 2 TABLETS BY MOUTH ONCE A DAY  180 tablet  1  . pantoprazole (PROTONIX) 40 MG tablet Take 1 tablet (40 mg total) by mouth daily before breakfast.  60 tablet  6  . Rivaroxaban (XARELTO) 20 MG TABS Take 1 tablet (20 mg total) by mouth daily at 12 noon.  90 tablet  3  . fluticasone (FLONASE) 50 MCG/ACT nasal spray Place 2 sprays into the nose daily.  48 g  0   No current facility-administered medications on file prior to visit.   Assessment/Plan: This is a pleasant, 67 year old, white gentleman, with the following issues:  #1.  Positive for the anticardiolipin antibody.  He does have markedly elevated.  IgG anticardiolipin antibody, at 94.  His beta-2 glycoprotein.  IgG is 170.  His other regular beta-2 glycoprotein levels are also elevated.  Again, he is on lifelong Xarelto and Aspirin.    #2.  Followup.  We will follow back up with Douglas Mercer in 4 months, but before then should there be questions or concerns.

## 2012-05-30 ENCOUNTER — Ambulatory Visit (INDEPENDENT_AMBULATORY_CARE_PROVIDER_SITE_OTHER): Payer: Medicare Other | Admitting: Internal Medicine

## 2012-05-30 ENCOUNTER — Encounter: Payer: Self-pay | Admitting: Internal Medicine

## 2012-05-30 VITALS — BP 108/72 | HR 89 | Temp 98.2°F | Ht 70.0 in | Wt 167.0 lb

## 2012-05-30 DIAGNOSIS — Z0000 Encounter for general adult medical examination without abnormal findings: Secondary | ICD-10-CM

## 2012-05-30 DIAGNOSIS — E119 Type 2 diabetes mellitus without complications: Secondary | ICD-10-CM

## 2012-05-30 DIAGNOSIS — D6859 Other primary thrombophilia: Secondary | ICD-10-CM

## 2012-05-30 DIAGNOSIS — E785 Hyperlipidemia, unspecified: Secondary | ICD-10-CM

## 2012-05-30 DIAGNOSIS — D6861 Antiphospholipid syndrome: Secondary | ICD-10-CM

## 2012-05-30 LAB — LIPID PANEL
HDL: 38.7 mg/dL — ABNORMAL LOW (ref >39.00)
Total CHOL/HDL Ratio: 4
Triglycerides: 74 mg/dL (ref 0.0–149.0)
VLDL: 14.8 mg/dL (ref 0.0–40.0)

## 2012-05-30 LAB — BASIC METABOLIC PANEL
BUN: 21 mg/dL (ref 6–23)
CO2: 27 mEq/L (ref 19–32)
Chloride: 104 mEq/L (ref 96–112)
Glucose, Bld: 138 mg/dL — ABNORMAL HIGH (ref 70–99)
Potassium: 4.8 mEq/L (ref 3.5–5.1)

## 2012-05-30 LAB — ALT: ALT: 24 U/L (ref 0–53)

## 2012-05-30 LAB — MICROALBUMIN / CREATININE URINE RATIO
Creatinine,U: 107.8 mg/dL
Microalb Creat Ratio: 4.4 mg/g (ref 0.0–30.0)

## 2012-05-30 NOTE — Assessment & Plan Note (Signed)
Was switch from Coumadin to xarelto, doing well. Followup by hematology.

## 2012-05-30 NOTE — Progress Notes (Signed)
Subjective:    Patient ID: Douglas Mercer, male    DOB: 05/14/1945, 67 y.o.   MRN: [id number]  HPI Here for Medicare AWV:  1. Risk factors based on Past M, S, F history: reviewed 2. Physical Activities: active at work, no other exercise  3. Depression/mood:  (-) screening  4. Hearing:  No depression or anxiety 5. ADL's:  Independent  6. Fall Risk: no recent falls, see instructions  7. home Safety: does feel safe at home  8. Height, weight, &visual acuity: see VS, uses glasses, sees Dr Hazle Quant 9. Counseling: provided 10. Labs ordered based on risk factors: if needed  11. Referral Coordination: if needed 12.  Care Plan, see assessment and plan  13.   Cognitive Assessment: motor and cognition appropriate  In addition, today we discussed the following: BPH-- sees urology once a year. Good compliance with aspirin and Xarelto without apparent side effects. Diabetes, good compliance with medication, [place] better, better diet? High cholesterol, good compliance with both medicines, taking a generic Niaspan, no apparent side effects. Hypertension, good medication compliance, his BP is checked often when he visits the doctor, always normal.   Past Medical History  Diagnosis Date  . Allergic rhinitis   . Hyperlipidemia   . Diabetes mellitus     type 2  . BPH (benign prostatic hypertrophy)   . Pulmonary embolism 1992    bilateral w/ PE reportedly (+) lupus anticoagulant  . DVT (deep venous thrombosis) 02/2009    large left lower  . ED (erectile dysfunction)   . Rosacea   . Tubular adenoma of colon   . Hypertension   . Internal hemorrhoids   . Anti-phospholipid antibody syndrome   . GERD (gastroesophageal reflux disease)    Past Surgical History  Procedure Laterality Date  . Inguinal hernia repair  1995 /2011    right-sided, mesh placed  . Tonsillectomy    . Transurethral resection of prostate  08/13/09  . Colonoscopy   05/07/2006  . Diverticulosis  07/07/2002,04/19/2001    mild sigmoid  . Colonoscopy with polypectomy  07/07/2002    2 small descending colon polyps  . Colonoscopy with polypectomy  04/19/2001    6mm sessile mucosal polyp in distal sigmoid colon  . Polypectomy    . Esophagogastroduodenoscopy      Family History  Problem Relation Age of Onset  . Diabetes Mother   . Melanoma Mother   . Heart attack Maternal Uncle   . Stroke Maternal Aunt     grandparents  . Colon cancer Paternal Uncle     x2  . 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 No blood in the stools. No dysuria, gross hematuria or difficulty urinating.    Objective:   Physical Exam General -- alert, well-developed, no apparent distress  Neck --no thyromegaly , normal carotid pulse Lungs -- normal respiratory effort, no intercostal retractions, no accessory muscle use, and normal breath sounds.   Heart-- normal rate, regular rhythm, no murmur, and no gallop.   Abdomen--soft, non-tender, no distention, no masses, no HSM, no guarding, and no rigidity.   Extremities-- no pretibial edema bilaterally Neurologic-- alert & oriented X3 and strength normal in all extremities. Psych--  Cognition and judgment appear intact. Alert and cooperative with normal attention span and concentration.  not anxious appearing and not depressed appearing.       Assessment & Plan:

## 2012-05-30 NOTE — Assessment & Plan Note (Signed)
He switch to generic Niaspan, no apparent complications. Labs

## 2012-05-30 NOTE — Patient Instructions (Addendum)

## 2012-05-30 NOTE — Assessment & Plan Note (Addendum)
Td  2007, Pneumovax  2011,  Had a Shingles immunization   PSA--per urology  Colonoscopy 05/08/2006, was found to have a polyp, tubular adenoma.  Cscope again 6-13, 1 polyp, next per GI   diet and exercise discussed

## 2012-05-30 NOTE — Assessment & Plan Note (Signed)
Seems to be doing great, since he moved to [place], blood sugars are better. Labs

## 2012-06-03 ENCOUNTER — Encounter: Payer: Self-pay | Admitting: *Deleted

## 2012-08-13 ENCOUNTER — Other Ambulatory Visit: Payer: Self-pay | Admitting: Internal Medicine

## 2012-08-13 DIAGNOSIS — L719 Rosacea, unspecified: Secondary | ICD-10-CM

## 2012-08-13 DIAGNOSIS — E119 Type 2 diabetes mellitus without complications: Secondary | ICD-10-CM

## 2012-08-13 DIAGNOSIS — Z7901 Long term (current) use of anticoagulants: Secondary | ICD-10-CM

## 2012-08-13 DIAGNOSIS — E785 Hyperlipidemia, unspecified: Secondary | ICD-10-CM

## 2012-08-13 DIAGNOSIS — I868 Varicose veins of other specified sites: Secondary | ICD-10-CM

## 2012-08-13 NOTE — Telephone Encounter (Signed)
Used to take doxycycline 50 mg daily for rosacea. Call patient, has he been taking doxycycline? Dose?

## 2012-08-13 NOTE — Telephone Encounter (Signed)
Pt woulf like Refill on lisinopril (PRINIVIL,ZESTRIL) 5 MG tablet glipiZIDE (GLUCOTROL XL) 2.5 MG 24 hr tablet metroNIDAZOLE (METROCREAM) 0.75 % cream fenofibrate 160 MG tablet Doxycycline 90 day 100mg  Pt would like to pick his prescriptions up when completed. thanks

## 2012-08-13 NOTE — Telephone Encounter (Signed)
Doxycycline 100 mg twice a day for rosacea is a high dose. If he needs doxycycline for rosacea, call  50 mg one by mouth daily, #30 and 5 refills

## 2012-08-13 NOTE — Telephone Encounter (Signed)
Pt states his last rx he had filled was doxycycline 100mg  1 tab BID.

## 2012-08-13 NOTE — Telephone Encounter (Signed)
Ok to refill doxycycline 100mg ?  Last OV 4.10.14

## 2012-08-14 ENCOUNTER — Other Ambulatory Visit: Payer: Self-pay | Admitting: *Deleted

## 2012-08-14 MED ORDER — FENOFIBRATE 160 MG PO TABS: 160.0000 mg | ORAL_TABLET | Freq: Every day | ORAL | Status: DC

## 2012-08-14 MED ORDER — DOXYCYCLINE HYCLATE 50 MG PO CAPS: 50.0000 mg | ORAL_CAPSULE | Freq: Every day | ORAL | Status: AC

## 2012-08-14 MED ORDER — GLIPIZIDE ER 2.5 MG PO TB24: 2.5000 mg | ORAL_TABLET | Freq: Every day | ORAL | Status: DC

## 2012-08-14 MED ORDER — METRONIDAZOLE 0.75 % EX CREA: TOPICAL_CREAM | Freq: Every evening | CUTANEOUS | Status: DC

## 2012-08-14 MED ORDER — LISINOPRIL 5 MG PO TABS: 5.0000 mg | ORAL_TABLET | Freq: Every day | ORAL | Status: DC

## 2012-08-14 NOTE — Telephone Encounter (Signed)
Refill done.  

## 2012-08-18 ENCOUNTER — Other Ambulatory Visit: Payer: Self-pay | Admitting: Internal Medicine

## 2012-08-19 NOTE — Telephone Encounter (Signed)
Refill done per protocol.  

## 2012-08-26 ENCOUNTER — Ambulatory Visit: Payer: Medicare Other | Admitting: Hematology & Oncology

## 2012-08-26 ENCOUNTER — Ambulatory Visit (HOSPITAL_BASED_OUTPATIENT_CLINIC_OR_DEPARTMENT_OTHER): Payer: Medicare Other | Admitting: Hematology & Oncology

## 2012-08-26 ENCOUNTER — Other Ambulatory Visit: Payer: Medicare Other | Admitting: Lab

## 2012-08-26 ENCOUNTER — Other Ambulatory Visit (HOSPITAL_BASED_OUTPATIENT_CLINIC_OR_DEPARTMENT_OTHER): Payer: Medicare Other | Admitting: Lab

## 2012-08-26 VITALS — BP 115/60 | HR 94 | Temp 98.1°F | Resp 18 | Ht 70.0 in | Wt 170.0 lb

## 2012-08-26 DIAGNOSIS — D6859 Other primary thrombophilia: Secondary | ICD-10-CM

## 2012-08-26 DIAGNOSIS — D6861 Antiphospholipid syndrome: Secondary | ICD-10-CM

## 2012-08-26 LAB — CBC WITH DIFFERENTIAL (CANCER CENTER ONLY)
BASO#: 0 10*3/uL (ref 0.0–0.2)
Eosinophils Absolute: 0.1 10*3/uL (ref 0.0–0.5)
HCT: 46.4 % (ref 38.7–49.9)
HGB: 15.7 g/dL (ref 13.0–17.1)
LYMPH#: 1 10*3/uL (ref 0.9–3.3)
MCHC: 33.8 g/dL (ref 32.0–35.9)
MONO#: 0.4 10*3/uL (ref 0.1–0.9)
NEUT%: 72.1 % (ref 40.0–80.0)
WBC: 5.4 10*3/uL (ref 4.0–10.0)

## 2012-08-26 NOTE — Progress Notes (Signed)
This office note has been dictated.

## 2012-08-27 NOTE — Progress Notes (Signed)
CC:   Willow Ora, MD  DIAGNOSIS: 1. Anticardiolipin antibody syndrome. 2. Pulmonary embolism/deep venous thrombosis.  CURRENT THERAPY: 1. Xarelto 20 mg p.o. daily-lifelong. 2. Aspirin 81 mg p.o. daily-lifelong.  INTERIM HISTORY:  Douglas Mercer comes in for his followup.  He is mostly living down at [facility] now.  He is going to live down there permanently.  As such, he is going to find a family doctor down there and likely a hematologist.  I told that we could send records down once we know who he is going to be seeing.  He has had no problems with cough or shortness of breath.  He has had no problems with leg pain or swelling.  When he was last seen back in March, his D-dimer was 0.27.  Apparently, he has had no bleeding.  He has had no change in bowel or bladder habits.  He has had no rashes.  He has had no headache.  PHYSICAL EXAMINATION:  General:  This is a well-developed, well- nourished, white gentleman in no obvious distress.  Vital signs: Temperature of 98.1, pulse 94, respiratory rate 18, blood pressure 115/60.  Weight is 170.  Head and neck:  Normocephalic, atraumatic skull.  There are no ocular or oral lesions.  There are no palpable cervical or supraclavicular lymph nodes.  Lungs:  Clear bilaterally. Cardiac:  Regular rate and rhythm with a normal S1, S2.  There are no murmurs, rubs, or bruits.  Abdomen:  Soft with good bowel sounds.  There is no palpable abdominal mass.  There is no fluid wave.  There is no palpable hepatosplenomegaly.  Extremities:  Show no clubbing, cyanosis or edema.  Neurological:  Shows no focal neurological deficits.  Skin: No rash, ecchymosis, or petechia.  LABORATORY STUDIES:  White cell count 5.4, hemoglobin 15.7, hematocrit 46.4, platelet count 171.  IMPRESSION:  Douglas Mercer is a very nice 67 year old gentleman with a true anticardiolipin antibody.  He has a history of pulmonary embolism/DVT.  The pulmonary embolism was back in  1992.  He then was taken off Coumadin back in 2011 and developed a left leg DVT.  This was back in again 2011.  Again, I think lifelong Coumadin is what he will need.  I also believe that aspirin will help given that he does have the anticardiolipin antibody.  He is having no problems from my point of view.  He is asymptomatic.  We will go ahead and have him come back only as necessary.  He is going to be moving down to one of [place] permanently.  He is going to try to do this before the end of the year.  He says he will find a family doctor down there.  It has been very nice taking care of Douglas Mercer.  He has done everything that we have asked him to do.    ______________________________ Douglas Mercer, M.D. PRE/MEDQ  D:  08/26/2012  T:  08/27/2012  Job:  [id number]

## 2012-11-28 ENCOUNTER — Ambulatory Visit: Payer: Medicare Other | Admitting: Internal Medicine

## 2014-04-08 ENCOUNTER — Encounter (INDEPENDENT_AMBULATORY_CARE_PROVIDER_SITE_OTHER): Payer: Self-pay

## 2014-04-08 ENCOUNTER — Ambulatory Visit (INDEPENDENT_AMBULATORY_CARE_PROVIDER_SITE_OTHER): Payer: Medicare Other | Admitting: Nurse Practitioner

## 2014-04-08 ENCOUNTER — Encounter: Payer: Self-pay | Admitting: Nurse Practitioner

## 2014-04-08 VITALS — BP 142/80 | HR 91 | Temp 97.8°F | Resp 14 | Ht 70.8 in | Wt 165.1 lb

## 2014-04-08 DIAGNOSIS — L719 Rosacea, unspecified: Secondary | ICD-10-CM

## 2014-04-08 DIAGNOSIS — E119 Type 2 diabetes mellitus without complications: Secondary | ICD-10-CM

## 2014-04-08 LAB — MICROALBUMIN / CREATININE URINE RATIO
CREATININE, U: 106.2 mg/dL
Microalb Creat Ratio: 2.9 mg/g (ref 0.0–30.0)
Microalb, Ur: 3.1 mg/dL — ABNORMAL HIGH (ref 0.0–1.9)

## 2014-04-08 LAB — CBC WITH DIFFERENTIAL/PLATELET
Basophils Absolute: 0 10*3/uL (ref 0.0–0.1)
Basophils Relative: 0.5 % (ref 0.0–3.0)
EOS PCT: 1.5 % (ref 0.0–5.0)
Eosinophils Absolute: 0.1 10*3/uL (ref 0.0–0.7)
HEMATOCRIT: 45.3 % (ref 39.0–52.0)
HEMOGLOBIN: 15.4 g/dL (ref 13.0–17.0)
LYMPHS ABS: 0.9 10*3/uL (ref 0.7–4.0)
LYMPHS PCT: 18.4 % (ref 12.0–46.0)
MCHC: 34.1 g/dL (ref 30.0–36.0)
MCV: 91 fl (ref 78.0–100.0)
MONOS PCT: 7.2 % (ref 3.0–12.0)
Monocytes Absolute: 0.4 10*3/uL (ref 0.1–1.0)
NEUTROS ABS: 3.7 10*3/uL (ref 1.4–7.7)
Neutrophils Relative %: 72.4 % (ref 43.0–77.0)
Platelets: 229 10*3/uL (ref 150.0–400.0)
RBC: 4.98 Mil/uL (ref 4.22–5.81)
RDW: 13.4 % (ref 11.5–15.5)
WBC: 5.1 10*3/uL (ref 4.0–10.5)

## 2014-04-08 LAB — HEMOGLOBIN A1C: Hgb A1c MFr Bld: 6.7 % — ABNORMAL HIGH (ref 4.6–6.5)

## 2014-04-08 NOTE — Progress Notes (Signed)
Pre visit review using our clinic review tool, if applicable. No additional management support is needed unless otherwise documented below in the visit note. 

## 2014-04-08 NOTE — Assessment & Plan Note (Signed)
Controlled on every other day Tetracycline. Given by a Dermatologist in [place].

## 2014-04-08 NOTE — Progress Notes (Signed)
Subjective:    Patient ID: Camari Wisham, male    DOB: [birth year], 69 y.o.   MRN: [id number]  HPI  Mr. Lundeen is a 69 yo male establishing care with our office- has been seen by another provider within 2 years.   1) New pt info:   Diet- 1 meal a day in the dining room, avoids dessert usually, eats balanced meals   Exercise- Walks 7 x a week, manages construction projections  Immunizations- Up to date  Colonoscopy- 2011 or 2012   PSA- High, TURP and 18 biopsies  Eye Exam- November, beginning of glaucoma, on drops  Dental Exam- Up to date   2) Chronic Problems-  Diabetes- not checking recently, BS fasting 120's to 130's  Sees Dr. Leonarda Salon- tetracycline for rosacea and lumigen drops   Tetracycline every other day   Review of Systems  Constitutional: Negative for fever, chills, diaphoresis, fatigue and unexpected weight change.  HENT: Negative for tinnitus and trouble swallowing.   Eyes: Negative for visual disturbance.  Respiratory: Negative for chest tightness, shortness of breath and wheezing.   Cardiovascular: Negative for chest pain, palpitations and leg swelling.  Gastrointestinal: Negative for nausea, vomiting, diarrhea and constipation.  Endocrine: Negative for cold intolerance and heat intolerance.  Genitourinary: Negative for difficulty urinating.  Musculoskeletal: Negative for myalgias, back pain and neck pain.  Skin: Negative for rash.  Neurological: Negative for dizziness, weakness, numbness and headaches.  Hematological: Bruises/bleeds easily.       On Xarelto  Psychiatric/Behavioral: Negative for suicidal ideas and sleep disturbance. The patient is not nervous/anxious.        Denies depression   Past Medical History  Diagnosis Date  . Allergic rhinitis   . Hyperlipidemia   . Diabetes mellitus     type 2  . BPH (benign prostatic hypertrophy)   . Pulmonary embolism 1992    bilateral w/ PE reportedly (+) lupus anticoagulant  . DVT (deep venous  thrombosis) 02/2009    large left lower  . ED (erectile dysfunction)   . Rosacea   . Tubular adenoma of colon   . Hypertension   . Internal hemorrhoids   . Anti-phospholipid antibody syndrome   . GERD (gastroesophageal reflux disease)   . Glaucoma     History   Social History  . Marital Status: Married    Spouse Name: N/A  . Number of Children: 0  . Years of Education: N/A   Occupational History  . Neurosurgeon   .     Social History Main Topics  . Smoking status: Never Smoker   . Smokeless tobacco: Never Used  . Alcohol Use: 0.0 oz/week    0 Standard drinks or equivalent per week     Comment: socially  . Drug Use: No  . Sexual Activity: Not Currently   Other Topics Concern  . 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 Inguinal hernia repair  1995 /2011    right-sided, mesh placed  . Tonsillectomy    . Transurethral resection of prostate  08/13/09  . Colonoscopy  05/07/2006  . Diverticulosis  07/07/2002,04/19/2001    mild sigmoid  . Colonoscopy with polypectomy  07/07/2002  2 small descending colon polyps  . Colonoscopy with polypectomy  04/19/2001    93mm sessile mucosal polyp in distal sigmoid colon  . Esophagogastroduodenoscopy    . Prostate surgery      18 biopsies.  Benign.    Family History  Problem Relation Age of Onset  . Diabetes Mother   . Melanoma Mother   . Heart attack Maternal Uncle   . Stroke Maternal Aunt     grandparents  . Colon cancer Paternal Uncle     x2  . Prostate cancer Neg Hx     No Known Allergies  Current Outpatient Prescriptions on File Prior to Visit  Medication Sig Dispense Refill  . aspirin 81 MG tablet Take 81 mg by mouth daily.    Marland Kitchen co-enzyme  Q-10 30 MG capsule Take 100 mg by mouth every morning.     . fenofibrate 160 MG tablet Take 1 tablet (160 mg total) by mouth daily. 90 tablet 1  . fish oil-omega-3 fatty acids 1000 MG capsule Take 2 g by mouth daily.      . fluticasone (FLONASE) 50 MCG/ACT nasal spray Place 2 sprays into the nose daily. 48 g 0  . metFORMIN (GLUCOPHAGE) 850 MG tablet TAKE 1 TABLET BY MOUTH TWICE DAILY WITH MEALS 180 tablet 1  . Multiple Vitamin (MULTIVITAMIN) capsule Diabetic health Pack 6-7 pills a day    . pantoprazole (PROTONIX) 40 MG tablet Take 1 tablet (40 mg total) by mouth daily before breakfast. 60 tablet 6  . Rivaroxaban (XARELTO) 20 MG TABS Take 1 tablet (20 mg total) by mouth daily at 12 noon. 90 tablet 3  . glipiZIDE (GLUCOTROL XL) 2.5 MG 24 hr tablet Take 1 tablet (2.5 mg total) by mouth daily. 90 tablet 1   No current facility-administered medications on file prior to visit.       Objective:   Physical Exam  Constitutional: He is oriented to person, place, and time. He appears well-developed and well-nourished. No distress.  BP 142/80 mmHg  Pulse 91  Temp(Src) 97.8 F (36.6 C) (Oral)  Resp 14  Ht 5' 10.8" (1.798 m)  Wt 165 lb 1.9 oz (74.898 kg)  BMI 23.17 kg/m2  SpO2 97%   HENT:  Head: Normocephalic and atraumatic.  Right Ear: External ear normal.  Left Ear: External ear normal.  Left hearing aid Poor dentition- seeing a dentist  Eyes: Pupils are equal, round, and reactive to light. Right eye exhibits no discharge. Left eye exhibits no discharge. No scleral icterus.  Neck: Normal range of motion. Neck supple. No thyromegaly present.  Cardiovascular: Normal rate, regular rhythm, normal heart sounds and intact distal pulses.  Exam reveals no gallop and no friction rub.   No murmur heard. Pulmonary/Chest: Effort normal and breath sounds normal. No respiratory distress. He has no wheezes. He has no rales. He exhibits no tenderness.  Lymphadenopathy:    He has no cervical adenopathy.    Neurological: He is alert and oriented to person, place, and time.  Skin: Skin is warm and dry. No rash noted. He is not diaphoretic.  Psychiatric: He has a normal mood and affect. His behavior is normal. Judgment and thought content normal.      Assessment & Plan:

## 2014-04-08 NOTE — Patient Instructions (Addendum)
We will follow up in 3 months.   Welcome back to [facility]!

## 2014-04-08 NOTE — Assessment & Plan Note (Addendum)
Controlled. Eye exam current, foot exam today, obtaining A1c and microalbumin as well as a CBC w/ diff. Pt not needing refills today. FU in 3 months.

## 2014-05-29 ENCOUNTER — Encounter: Payer: Self-pay | Admitting: Nurse Practitioner

## 2014-05-29 MED ORDER — PANTOPRAZOLE SODIUM 40 MG PO TBEC: 40.0000 mg | DELAYED_RELEASE_TABLET | Freq: Every day | ORAL | Status: DC

## 2014-06-09 ENCOUNTER — Encounter: Payer: Self-pay | Admitting: Nurse Practitioner

## 2014-06-09 ENCOUNTER — Ambulatory Visit (INDEPENDENT_AMBULATORY_CARE_PROVIDER_SITE_OTHER): Payer: Medicare Other | Admitting: Nurse Practitioner

## 2014-06-09 VITALS — BP 126/68 | HR 90 | Resp 14 | Ht 70.5 in | Wt 163.5 lb

## 2014-06-09 DIAGNOSIS — E785 Hyperlipidemia, unspecified: Secondary | ICD-10-CM

## 2014-06-09 DIAGNOSIS — E119 Type 2 diabetes mellitus without complications: Secondary | ICD-10-CM | POA: Diagnosis not present

## 2014-06-09 LAB — LIPID PANEL
Cholesterol: 138 mg/dL (ref 0–200)
HDL: 35.1 mg/dL — AB (ref >39.00)
LDL Cholesterol: 78 mg/dL (ref 0–99)
NonHDL: 102.9
Total CHOL/HDL Ratio: 4
Triglycerides: 124 mg/dL (ref 0.0–149.0)
VLDL: 24.8 mg/dL (ref 0.0–40.0)

## 2014-06-09 NOTE — Progress Notes (Signed)
   Subjective:    Patient ID: Douglas Mercer, male    DOB: 1945/05/26, 69 y.o.   MRN: [id number]  HPI  Douglas Mercer is a 69 yo male with a CC of needing a foot exam per Mychart.   1) Foot exam was performed at diabetes follow up visit on 04/08/14. This was not documented on the health maintenance section prompting MyChart to send an alert to the pt.   Pt states he cuts his own toenails every 2-3 weeks. He soaks them and looks for wounds. Denies wounds today.   Review of Systems  Constitutional: Negative for fever, chills, diaphoresis and fatigue.  Skin: Negative for color change, pallor, rash and wound.       Varicose veins      Objective:   Physical Exam  Constitutional: He is oriented to person, place, and time. He appears well-developed and well-nourished. No distress.  BP 126/68 mmHg  Pulse 90  Resp 14  Ht 5' 10.5" (1.791 m)  Wt 163 lb 8 oz (74.163 kg)  BMI 23.12 kg/m2  SpO2 98%   HENT:  Head: Normocephalic and atraumatic.  Right Ear: External ear normal.  Left Ear: External ear normal.  Neurological: He is alert and oriented to person, place, and time.  Monofilament exam normal bilateral feet  Skin: Skin is warm and dry. No rash noted. He is not diaphoretic.  Toe nails on bilateral feet have fungal changes, no wounds or breaks in skin seen  Psychiatric: He has a normal mood and affect. His behavior is normal. Judgment and thought content normal.      Assessment & Plan:

## 2014-06-09 NOTE — Patient Instructions (Signed)
Let me know if you are ever interested in seeing podiatry for your nails.   Keep up the good work!

## 2014-06-09 NOTE — Assessment & Plan Note (Signed)
Foot exam was normal today. Toe nails have fungal changes. Asked him to let me know if he wants a podiatry consult. Will obtain lipid panel. FU in August.

## 2014-06-09 NOTE — Progress Notes (Signed)
Pre visit review using our clinic review tool, if applicable. No additional management support is needed unless otherwise documented below in the visit note. 

## 2014-06-11 ENCOUNTER — Telehealth: Payer: Self-pay | Admitting: Nurse Practitioner

## 2014-06-19 ENCOUNTER — Encounter: Payer: Self-pay | Admitting: Nurse Practitioner

## 2014-06-19 MED ORDER — METFORMIN HCL 850 MG PO TABS: 850.0000 mg | ORAL_TABLET | Freq: Two times a day (BID) | ORAL | Status: DC

## 2014-07-08 ENCOUNTER — Ambulatory Visit (INDEPENDENT_AMBULATORY_CARE_PROVIDER_SITE_OTHER): Payer: Medicare Other | Admitting: Nurse Practitioner

## 2014-07-08 VITALS — BP 126/78 | HR 91 | Temp 98.0°F | Resp 18 | Ht 71.0 in | Wt 163.1 lb

## 2014-07-08 DIAGNOSIS — E119 Type 2 diabetes mellitus without complications: Secondary | ICD-10-CM

## 2014-07-08 DIAGNOSIS — I2699 Other pulmonary embolism without acute cor pulmonale: Secondary | ICD-10-CM

## 2014-07-08 DIAGNOSIS — D6861 Antiphospholipid syndrome: Secondary | ICD-10-CM

## 2014-07-08 DIAGNOSIS — E785 Hyperlipidemia, unspecified: Secondary | ICD-10-CM

## 2014-07-08 MED ORDER — RIVAROXABAN 20 MG PO TABS: 20.0000 mg | ORAL_TABLET | Freq: Every day | ORAL | Status: DC

## 2014-07-08 MED ORDER — GLIPIZIDE ER 2.5 MG PO TB24: 2.5000 mg | ORAL_TABLET | Freq: Every day | ORAL | Status: DC

## 2014-07-08 MED ORDER — FENOFIBRATE 160 MG PO TABS: 160.0000 mg | ORAL_TABLET | Freq: Every day | ORAL | Status: DC

## 2014-07-08 NOTE — Patient Instructions (Addendum)
Follow up in 6 months for Diabetes/DVT/Medication refills.

## 2014-07-08 NOTE — Progress Notes (Signed)
Pre visit review using our clinic review tool, if applicable. No additional management support is needed unless otherwise documented below in the visit note. 

## 2014-07-08 NOTE — Progress Notes (Signed)
   Subjective:    Patient ID: Douglas Mercer, male    DOB: [birth year], 69 y.o.   MRN: [id number]  HPI  Douglas Mercer is a 69 yo male with a CC of following up on his diabetes and DVT/ refills of medications.   1) Xarelto- for multiple DVTs- Dr. Martha Clan   2) Glipizide, fenofibrate, Xarelto- 90 day supply Protonix takes twice a day before a meal and needs 90 day supply   Takes before lunch and supper   3) DVT- Anticardiolipin antibody positive-  See note from 04/25/12 by Helayne Seminole, PA-C. He will be on lifelong Xarelto and ASA.   Review of Systems  Constitutional: Negative for fever, chills, diaphoresis and fatigue.  Eyes: Negative for visual disturbance.  Respiratory: Negative for chest tightness, shortness of breath and wheezing.   Cardiovascular: Negative for chest pain, palpitations and leg swelling.  Gastrointestinal: Negative for nausea, vomiting and diarrhea.  Endocrine: Negative for polydipsia, polyphagia and polyuria.  Skin: Negative for rash.  Neurological: Negative for dizziness, weakness and numbness.  Psychiatric/Behavioral: The patient is not nervous/anxious.       Objective:   Physical Exam  Constitutional: He is oriented to person, place, and time. He appears well-developed and well-nourished. No distress.  BP 126/78 mmHg  Pulse 91  Temp(Src) 98 F (36.7 C)  Resp 18  Ht 5\' 11"  (1.803 m)  Wt 163 lb 1.9 oz (73.991 kg)  BMI 22.76 kg/m2  SpO2 99%   HENT:  Head: Normocephalic and atraumatic.  Right Ear: External ear normal.  Left Ear: External ear normal.  Cardiovascular: Normal rate, regular rhythm, normal heart sounds and intact distal pulses.  Exam reveals no gallop and no friction rub.   No murmur heard. Pulmonary/Chest: Effort normal and breath sounds normal. No respiratory distress. He has no wheezes. He has no rales. He exhibits no tenderness.  Neurological: He is alert and oriented to person, place, and time.  Skin: Skin is warm and dry. No rash  noted. He is not diaphoretic.  Psychiatric: He has a normal mood and affect. His behavior is normal. Judgment and thought content normal.      Assessment & Plan:

## 2014-07-25 ENCOUNTER — Encounter: Payer: Self-pay | Admitting: Nurse Practitioner

## 2014-07-25 NOTE — Assessment & Plan Note (Signed)
Refilled 90 day supply of fenofibrate 160 mg to continue this regimen along with Fish oil OTC 2 g daily. Will follow.

## 2014-07-25 NOTE — Assessment & Plan Note (Signed)
Refills for 90 day supply given for Glipizide. Okay on Metformin- continue. He is complete on follow up for diabetes management. Will follow.

## 2014-07-25 NOTE — Assessment & Plan Note (Addendum)
Note from 04/25/12 from [facility]. Followed by Dr. Marin Olp and PA. Refilled Xarelto today for 90 day supply.

## 2014-10-05 ENCOUNTER — Encounter: Payer: Self-pay | Admitting: Nurse Practitioner

## 2014-12-01 ENCOUNTER — Other Ambulatory Visit: Payer: Self-pay | Admitting: Nurse Practitioner

## 2014-12-18 ENCOUNTER — Other Ambulatory Visit: Payer: Self-pay | Admitting: Nurse Practitioner

## 2015-01-08 ENCOUNTER — Ambulatory Visit: Payer: Medicare Other | Admitting: Nurse Practitioner

## 2015-01-08 LAB — HM DIABETES EYE EXAM

## 2015-01-11 ENCOUNTER — Ambulatory Visit (INDEPENDENT_AMBULATORY_CARE_PROVIDER_SITE_OTHER): Payer: Medicare Other | Admitting: Nurse Practitioner

## 2015-01-11 ENCOUNTER — Encounter: Payer: Self-pay | Admitting: Nurse Practitioner

## 2015-01-11 VITALS — BP 140/80 | HR 105 | Temp 98.5°F | Wt 167.0 lb

## 2015-01-11 DIAGNOSIS — E119 Type 2 diabetes mellitus without complications: Secondary | ICD-10-CM | POA: Diagnosis not present

## 2015-01-11 LAB — HEMOGLOBIN A1C: Hgb A1c MFr Bld: 7.6 % — ABNORMAL HIGH (ref 4.6–6.5)

## 2015-01-11 NOTE — Patient Instructions (Addendum)
Good to see you! Happy Holidays.   See you in 6 months.

## 2015-01-11 NOTE — Assessment & Plan Note (Signed)
Stable. Pt is UTD on all diabetic maintenance measures. He will get updated levels, foot exam, and 2nd pneumovax in April 2017. FU in 6 months.

## 2015-01-11 NOTE — Progress Notes (Signed)
Patient ID: Douglas Mercer, male    DOB: 05-11-1945  Age: 69 y.o. MRN: [id number]  CC: Follow-up   HPI Douglas Mercer presents for follow up on DM type II.  1) DM- needs updated A1c today. He still eats lunch daily at his facility. He reports the food quality has "gone downhill". He had a recent eye exam, which showed no diabetic retinopathy. He is UTD on his health maintenance items and denies needing refills today.   History Douglas Mercer has a past medical history of Allergic rhinitis; Hyperlipidemia; Diabetes mellitus; BPH (benign prostatic hypertrophy); Pulmonary embolism ([place]) (1992); DVT (deep venous thrombosis) ([place]) (02/2009); ED (erectile dysfunction); Rosacea; Tubular adenoma of colon; Hypertension; Internal hemorrhoids; Anti-phospholipid antibody syndrome (HCC); GERD (gastroesophageal reflux disease); and Glaucoma.   He has past surgical history that includes Inguinal hernia repair (1995 /2011); Tonsillectomy; Transurethral resection of prostate (08/13/09); Colonoscopy (05/07/2006); diverticulosis (07/07/2002,04/19/2001); colonoscopy with polypectomy (07/07/2002); colonoscopy with polypectomy (04/19/2001); Esophagogastroduodenoscopy; and Prostate surgery.   His family history includes Colon cancer in his paternal uncle; Diabetes in his mother; Heart attack in his maternal uncle; Melanoma in his mother; Stroke in his maternal aunt. There is no history of Prostate cancer.He reports that he has never smoked. He has never used smokeless tobacco. He reports that he drinks alcohol. He reports that he does not use illicit drugs.  Outpatient Prescriptions Prior to Visit  Medication Sig Dispense Refill  . aspirin 81 MG tablet Take 81 mg by mouth daily.    Marland Kitchen co-enzyme Q-10 30 MG capsule Take 100 mg by mouth every morning.     . fenofibrate 160 MG tablet Take 1 tablet (160 mg total) by mouth daily. 90 tablet 2  . fish oil-omega-3 fatty acids 1000 MG capsule Take 2 g by mouth daily.      . fluticasone  (FLONASE) 50 MCG/ACT nasal spray Place 2 sprays into the nose daily. 48 g 0  . glipiZIDE (GLUCOTROL XL) 2.5 MG 24 hr tablet Take 1 tablet (2.5 mg total) by mouth daily. 90 tablet 2  . metFORMIN (GLUCOPHAGE) 850 MG tablet TAKE 1 TABLET BY MOUTH TWICE DAILY WITH A MEAL 180 tablet 0  . Multiple Vitamin (MULTIVITAMIN) capsule Diabetic health Pack 6-7 pills a day    . pantoprazole (PROTONIX) 40 MG tablet TAKE 1 TABLET(40 MG) BY MOUTH DAILY BEFORE BREAKFAST 90 tablet 0  . rivaroxaban (XARELTO) 20 MG TABS tablet Take 1 tablet (20 mg total) by mouth daily at 12 noon. 90 tablet 3   No facility-administered medications prior to visit.    ROS Review of Systems  Constitutional: Negative for fever, chills, diaphoresis and fatigue.  Eyes: Negative for visual disturbance.  Respiratory: Negative for chest tightness, shortness of breath and wheezing.   Cardiovascular: Negative for chest pain, palpitations and leg swelling.  Gastrointestinal: Negative for nausea, vomiting and diarrhea.  Endocrine: Negative for polydipsia, polyphagia and polyuria.  Skin: Negative for rash.  Neurological: Negative for dizziness, weakness and numbness.  Psychiatric/Behavioral: The patient is not nervous/anxious.     Objective:  BP 140/80 mmHg  Pulse 105  Temp(Src) 98.5 F (36.9 C) (Oral)  Wt 167 lb (75.751 kg)  SpO2 96%  Physical Exam  Constitutional: He is oriented to person, place, and time. He appears well-developed and well-nourished. No distress.  HENT:  Head: Normocephalic and atraumatic.  Right Ear: External ear normal.  Left Ear: External ear normal.  Cardiovascular: Normal heart sounds.  Exam reveals no gallop and no friction rub.   No murmur  heard. Pulmonary/Chest: Effort normal and breath sounds normal. No respiratory distress. He has no wheezes. He has no rales. He exhibits no tenderness.  Neurological: He is alert and oriented to person, place, and time.  Skin: Skin is warm and dry. No rash noted. He  is not diaphoretic.  Psychiatric: He has a normal mood and affect. His behavior is normal. Judgment and thought content normal.    Assessment & Plan:   Douglas Mercer was seen today for follow-up.  Diagnoses and all orders for this visit:  Controlled type 2 diabetes mellitus without complication, without long-term current use of insulin (HCC) -     Hemoglobin A1c   I am having Douglas Mercer maintain his co-enzyme Q-10, multivitamin, fish oil-omega-3 fatty acids, aspirin, fluticasone, rivaroxaban, glipiZIDE, fenofibrate, pantoprazole, and metFORMIN.  No orders of the defined types were placed in this encounter.     Follow-up: Return in about 6 months (around 07/11/2015) for DM follow up .

## 2015-01-12 ENCOUNTER — Encounter: Payer: Self-pay | Admitting: Nurse Practitioner

## 2015-01-12 ENCOUNTER — Other Ambulatory Visit: Payer: Self-pay | Admitting: Nurse Practitioner

## 2015-01-12 MED ORDER — GLIPIZIDE ER 5 MG PO TB24: 5.0000 mg | ORAL_TABLET | Freq: Every day | ORAL | Status: DC

## 2015-03-02 ENCOUNTER — Telehealth: Payer: Self-pay | Admitting: Nurse Practitioner

## 2015-03-02 ENCOUNTER — Other Ambulatory Visit: Payer: Self-pay | Admitting: Nurse Practitioner

## 2015-03-02 DIAGNOSIS — E119 Type 2 diabetes mellitus without complications: Secondary | ICD-10-CM

## 2015-03-02 NOTE — Telephone Encounter (Signed)
Patient called and scheduled for 04/23/15 at 11 am

## 2015-03-02 NOTE — Telephone Encounter (Signed)
He will need to make a lab appointment for 04/14/15 or after. I will put in labs. Thanks!

## 2015-03-02 NOTE — Telephone Encounter (Signed)
Pt called to make an appt to get lab work in February. Need orders please and thank you!

## 2015-03-02 NOTE — Telephone Encounter (Signed)
FYI

## 2015-03-04 ENCOUNTER — Other Ambulatory Visit: Payer: Self-pay | Admitting: Nurse Practitioner

## 2015-03-15 ENCOUNTER — Other Ambulatory Visit: Payer: Self-pay | Admitting: Nurse Practitioner

## 2015-03-30 ENCOUNTER — Other Ambulatory Visit: Payer: Self-pay

## 2015-03-30 ENCOUNTER — Encounter: Payer: Self-pay | Admitting: Nurse Practitioner

## 2015-03-30 MED ORDER — FENOFIBRATE 160 MG PO TABS: 160.0000 mg | ORAL_TABLET | Freq: Every day | ORAL | Status: DC

## 2015-04-23 ENCOUNTER — Other Ambulatory Visit (INDEPENDENT_AMBULATORY_CARE_PROVIDER_SITE_OTHER): Payer: Medicare Other

## 2015-04-23 DIAGNOSIS — E119 Type 2 diabetes mellitus without complications: Secondary | ICD-10-CM | POA: Diagnosis not present

## 2015-04-23 LAB — HEMOGLOBIN A1C: HEMOGLOBIN A1C: 6.9 % — AB (ref 4.6–6.5)

## 2015-04-26 ENCOUNTER — Other Ambulatory Visit: Payer: Medicare Other

## 2015-04-26 DIAGNOSIS — R8290 Unspecified abnormal findings in urine: Secondary | ICD-10-CM

## 2015-04-26 LAB — MICROALBUMIN / CREATININE URINE RATIO
CREATININE, U: 109.8 mg/dL
MICROALB UR: 5.1 mg/dL — AB (ref 0.0–1.9)
MICROALB/CREAT RATIO: 4.6 mg/g (ref 0.0–30.0)

## 2015-05-24 ENCOUNTER — Other Ambulatory Visit: Payer: Self-pay | Admitting: Nurse Practitioner

## 2015-06-13 ENCOUNTER — Other Ambulatory Visit: Payer: Self-pay | Admitting: Nurse Practitioner

## 2015-06-23 ENCOUNTER — Ambulatory Visit (INDEPENDENT_AMBULATORY_CARE_PROVIDER_SITE_OTHER): Payer: Medicare Other

## 2015-06-23 VITALS — BP 130/80 | HR 93 | Temp 97.1°F | Resp 14 | Ht 69.0 in | Wt 165.4 lb

## 2015-06-23 DIAGNOSIS — Z23 Encounter for immunization: Secondary | ICD-10-CM

## 2015-06-23 DIAGNOSIS — Z0000 Encounter for general adult medical examination without abnormal findings: Secondary | ICD-10-CM

## 2015-06-23 NOTE — Progress Notes (Signed)
Subjective:   Douglas Mercer is a 70 y.o. male who presents for Medicare Annual/Subsequent preventive examination.  Review of Systems:  No ROS.  Medicare Wellness Visit.  Cardiac Risk Factors include: advanced age (>81men, >85 women);diabetes mellitus;male gender     Objective:    Vitals: BP 130/80 mmHg  Pulse 93  Temp(Src) 97.1 F (36.2 C) (Oral)  Resp 14  Ht 5\' 9"  (1.753 m)  Wt 165 lb 6.4 oz (75.025 kg)  BMI 24.41 kg/m2  SpO2 99%  Body mass index is 24.41 kg/(m^2).  Tobacco History  Smoking status  . Never Smoker   Smokeless tobacco  . Never Used     Counseling given: Not Answered   Past Medical History  Diagnosis Date  . Allergic rhinitis   . Hyperlipidemia   . Diabetes mellitus     type 2  . BPH (benign prostatic hypertrophy)   . Pulmonary embolism ([place]) 1992    bilateral w/ PE reportedly (+) lupus anticoagulant  . DVT (deep venous thrombosis) ([place]) 02/2009    large left lower  . ED (erectile dysfunction)   . Rosacea   . Tubular adenoma of colon   . Hypertension   . Internal hemorrhoids   . Anti-phospholipid antibody syndrome (HCC)   . GERD (gastroesophageal reflux disease)   . Glaucoma    Past Surgical History  Procedure Laterality Date  . Inguinal hernia repair  1995 /2011    right-sided, mesh placed  . Tonsillectomy    . Transurethral resection of prostate  08/13/09  . Colonoscopy  05/07/2006  . Diverticulosis  07/07/2002,04/19/2001    mild sigmoid  . Colonoscopy with polypectomy  07/07/2002    2 small descending colon polyps  . Colonoscopy with polypectomy  04/19/2001    71mm sessile mucosal polyp in distal sigmoid colon  . Esophagogastroduodenoscopy    . Prostate surgery      18 biopsies.  Benign.   Family History  Problem Relation Age of Onset  . Diabetes Mother   . Melanoma Mother   . COPD Mother   . Heart attack Maternal Uncle   . Stroke Maternal Aunt     grandparents  . Colon cancer Paternal Uncle     x2  . Prostate cancer Neg Hx    . COPD Father    History  Sexual Activity  . Sexual Activity: Not Currently    Outpatient Encounter Prescriptions as of 06/23/2015  Medication Sig  . aspirin 81 MG tablet Take 81 mg by mouth daily.  Marland Kitchen co-enzyme Q-10 30 MG capsule Take 100 mg by mouth every morning.   . fenofibrate 160 MG tablet Take 1 tablet (160 mg total) by mouth daily.  . fish oil-omega-3 fatty acids 1000 MG capsule Take 2 g by mouth daily.    . fluticasone (FLONASE) 50 MCG/ACT nasal spray Place 2 sprays into the nose daily.  Marland Kitchen glipiZIDE (GLUCOTROL XL) 5 MG 24 hr tablet TAKE 1 TABLET(5 MG) BY MOUTH DAILY WITH BREAKFAST  . LUMIGAN 0.01 % SOLN INSTILL 1 DROP IN BOTH EYES AT BEDTIME  . metFORMIN (GLUCOPHAGE) 850 MG tablet Take 1 tablet (850 mg total) by mouth 2 (two) times daily with a meal.  . Multiple Vitamin (MULTIVITAMIN) capsule Diabetic health Pack 6-7 pills a day  . pantoprazole (PROTONIX) 40 MG tablet TAKE 1 TABLET(40 MG) BY MOUTH DAILY BEFORE BREAKFAST  . rivaroxaban (XARELTO) 20 MG TABS tablet Take 1 tablet (20 mg total) by mouth daily at 12 noon.  Marland Kitchen  tetracycline (ACHROMYCIN,SUMYCIN) 500 MG capsule TK 1 C PO BID   No facility-administered encounter medications on file as of 06/23/2015.    Activities of Daily Living In your present state of health, do you have any difficulty performing the following activities: 06/23/2015  Hearing? Y  Vision? N  Difficulty concentrating or making decisions? N  Walking or climbing stairs? N  Dressing or bathing? N  Doing errands, shopping? N  Preparing Food and eating ? N  Using the Toilet? N  In the past six months, have you accidently leaked urine? Y  Do you have problems with loss of bowel control? N  Managing your Medications? N  Managing your Finances? N  Housekeeping or managing your Housekeeping? N    Patient Care Team: Rubbie Battiest, NP as PCP - General (Nurse Practitioner)   Assessment:   This is a routine wellness examination for Chevon. The goal of the  wellness visit is to assist the patient how to close the gaps in care and create a preventative care plan for the patient.   Osteoporosis reviewed.   Medications reviewed; taking without issues or barriers.  Safety issues reviewed; smoke detectors in the home. Firearms are locked in a secure place in the home. Wears seatbelts when driving or riding with others. No violence in the home.  No identified risk were noted; The patient was oriented x 3; appropriate in dress and manner and no objective failures at ADL's or IADL's.    PNA23 vaccine booster administered, L deltoid.  Tolerated well.  Type 2 diabetes mell-stable and followed by Lorane Gell, NP. Antiphospholipid sy-stable and followed by Dr. Marin Olp and PA. DMII wo cmp nt st uncntr-stable and followed by Lorane Gell, NP.  Patient Concerns:  None at this time.  Follow up with PCP as needed.   Exercise Activities and Dietary recommendations Current Exercise Habits: Home exercise routine, Type of exercise: walking;strength training/weights, Frequency (Times/Week): 5, Intensity: Moderate  Goals    . Healthy Lifestyle     Stay hydrated and drink plenty of fluids.  Increase water intake. Low carb foods. Lean meats and vegetables. Stay active and continue exercising.      Fall Risk Fall Risk  06/23/2015 04/08/2014 05/30/2012  Falls in the past year? No No No   Depression Screen PHQ 2/9 Scores 06/23/2015 04/08/2014 05/30/2012  PHQ - 2 Score 0 0 0    Cognitive Testing MMSE - Mini Mental State Exam 06/23/2015  Orientation to time 5  Orientation to Place 5  Registration 3  Attention/ Calculation 5  Recall 3  Language- name 2 objects 2  Language- repeat 1  Language- follow 3 step command 3  Language- read & follow direction 1  Write a sentence 1  Copy design 1  Total score 30    Immunization History  Administered Date(s) Administered  . Influenza Split 12/07/2011, 11/19/2013  . Influenza Whole 12/04/2008, 12/10/2009  .  Influenza-Unspecified 11/20/2012, 12/08/2014  . Pneumococcal Conjugate-13 05/28/2014  . Pneumococcal Polysaccharide-23 08/01/2006, 06/23/2015  . Td 11/20/2005  . Zoster 01/03/2011   Screening Tests Health Maintenance  Topic Date Due  . FOOT EXAM  06/09/2015  . INFLUENZA VACCINE  09/21/2015  . HEMOGLOBIN A1C  10/24/2015  . TETANUS/TDAP  11/21/2015  . OPHTHALMOLOGY EXAM  01/08/2016  . URINE MICROALBUMIN  04/25/2016  . COLONOSCOPY  07/31/2016  . ZOSTAVAX  Completed  . Hepatitis C Screening  Addressed  . PNA vac Low Risk Adult  Completed  Plan:   End of life planning; Advance aging; Advanced directives discussed. Copy of current HCPOA/Living Will requested.   During the course of the visit the patient was educated and counseled about the following appropriate screening and preventive services:   Vaccines to include Pneumoccal, Influenza, Hepatitis B, Td, Zostavax, HCV  Electrocardiogram  Cardiovascular Disease  Colorectal cancer screening  Diabetes screening  Prostate Cancer Screening  Glaucoma screening  Nutrition counseling   Smoking cessation counseling  Patient Instructions (the written plan) was given to the patient.    Varney Biles, LPN  [phone]

## 2015-06-23 NOTE — Patient Instructions (Addendum)
  Mr. Routson , Thank you for taking time to come for your Medicare Wellness Visit. I appreciate your ongoing commitment to your health goals. Please review the following plan we discussed and let me know if I can assist you in the future.   Follow up with Dr. Lacinda Axon.    Foot exam due at upcoming follow up appointment.   This is a list of the screening recommended for you and due dates:  Health Maintenance  Topic Date Due  . Complete foot exam   06/09/2015  . Flu Shot  09/21/2015  . Hemoglobin A1C  10/24/2015  . Tetanus Vaccine  11/21/2015  . Eye exam for diabetics  01/08/2016  . Urine Protein Check  04/25/2016  . Colon Cancer Screening  07/31/2016  . Shingles Vaccine  Completed  .  Hepatitis C: One time screening is recommended by Center for Disease Control  (CDC) for  adults born from [birth year] through 1964.   Addressed  . Pneumonia vaccines  Completed

## 2015-06-23 NOTE — Progress Notes (Signed)
I have read and agree with the visit with our Health Coach.   Lorane Gell, AGNP-C   [employer] [facility] [employer]

## 2015-07-12 ENCOUNTER — Ambulatory Visit: Payer: Medicare Other | Admitting: Nurse Practitioner

## 2015-07-14 ENCOUNTER — Ambulatory Visit: Payer: Medicare Other | Admitting: Nurse Practitioner

## 2015-07-14 ENCOUNTER — Encounter: Payer: Self-pay | Admitting: Family Medicine

## 2015-07-14 ENCOUNTER — Ambulatory Visit (INDEPENDENT_AMBULATORY_CARE_PROVIDER_SITE_OTHER): Payer: Medicare Other | Admitting: Family Medicine

## 2015-07-14 VITALS — BP 120/78 | HR 83 | Temp 98.2°F | Ht 71.0 in | Wt 165.2 lb

## 2015-07-14 DIAGNOSIS — D6861 Antiphospholipid syndrome: Secondary | ICD-10-CM

## 2015-07-14 DIAGNOSIS — E119 Type 2 diabetes mellitus without complications: Secondary | ICD-10-CM | POA: Insufficient documentation

## 2015-07-14 DIAGNOSIS — Z7901 Long term (current) use of anticoagulants: Secondary | ICD-10-CM

## 2015-07-14 DIAGNOSIS — E785 Hyperlipidemia, unspecified: Secondary | ICD-10-CM

## 2015-07-14 DIAGNOSIS — N400 Enlarged prostate without lower urinary tract symptoms: Secondary | ICD-10-CM | POA: Diagnosis not present

## 2015-07-14 DIAGNOSIS — E1165 Type 2 diabetes mellitus with hyperglycemia: Secondary | ICD-10-CM | POA: Insufficient documentation

## 2015-07-14 HISTORY — DX: Type 2 diabetes mellitus without complications: E11.9

## 2015-07-14 LAB — LIPID PANEL
Cholesterol: 134 mg/dL (ref 0–200)
HDL: 30.5 mg/dL — ABNORMAL LOW
LDL Cholesterol: 81 mg/dL (ref 0–99)
NonHDL: 103.44
Total CHOL/HDL Ratio: 4
Triglycerides: 112 mg/dL (ref 0.0–149.0)
VLDL: 22.4 mg/dL (ref 0.0–40.0)

## 2015-07-14 LAB — CBC
HCT: 45.2 % (ref 39.0–52.0)
Hemoglobin: 15.2 g/dL (ref 13.0–17.0)
MCHC: 33.6 g/dL (ref 30.0–36.0)
MCV: 89.8 fl (ref 78.0–100.0)
Platelets: 212 K/uL (ref 150.0–400.0)
RBC: 5.03 Mil/uL (ref 4.22–5.81)
RDW: 14.2 % (ref 11.5–15.5)
WBC: 4.9 K/uL (ref 4.0–10.5)

## 2015-07-14 LAB — COMPREHENSIVE METABOLIC PANEL WITH GFR
ALT: 21 U/L (ref 0–53)
AST: 23 U/L (ref 0–37)
Albumin: 4.4 g/dL (ref 3.5–5.2)
Alkaline Phosphatase: 42 U/L (ref 39–117)
BUN: 17 mg/dL (ref 6–23)
CO2: 29 meq/L (ref 19–32)
Calcium: 9.7 mg/dL (ref 8.4–10.5)
Chloride: 106 meq/L (ref 96–112)
Creatinine, Ser: 1.06 mg/dL (ref 0.40–1.50)
GFR: 73.55 mL/min
Glucose, Bld: 132 mg/dL — ABNORMAL HIGH (ref 70–99)
Potassium: 4.2 meq/L (ref 3.5–5.1)
Sodium: 139 meq/L (ref 135–145)
Total Bilirubin: 0.6 mg/dL (ref 0.2–1.2)
Total Protein: 6.7 g/dL (ref 6.0–8.3)

## 2015-07-14 LAB — LDL CHOLESTEROL, DIRECT: LDL DIRECT: 85 mg/dL

## 2015-07-14 NOTE — Assessment & Plan Note (Signed)
Stable. Unclear why patient is not on statin. Will continue to monitor.

## 2015-07-14 NOTE — Progress Notes (Signed)
Subjective:  Patient ID: Douglas Mercer, male    DOB: Mar 20, 1945  Age: 70 y.o. MRN: [id number]  CC: Follow up  HPI:  70 year old male with a past medical history of DM 2, BPH, hyperlipidemia, antiphospholipid syndrome presents for follow-up.  DM-2  Well controlled.  Last A1c in March was a goal of less than 7. A1c was 6.9.  He endorses compliance with glipizide and metformin.  In need of foot exam today.  HLD  Stable.  Not on statin; unclear why.  He is currently taking fenofibrate and fish oil.  Needs labs today.  BPH  Well controlled At this time.  Awakens one time a night to urinate.  Antiphospholipid syndrome  Extensive history of DVT and PE.  He has seen hematology in the past.  He has been doing well on xarelto.  Social Hx   Social History   Social History  . Marital Status: Married    Spouse Name: N/A  . Number of Children: 0  . Years of Education: N/A   Occupational History  . Neurosurgeon   .     Social History Main Topics  . Smoking status: Never Smoker   . Smokeless tobacco: Never Used  . Alcohol Use: 0.0 oz/week    0 Standard drinks or equivalent per week     Comment: socially  . Drug Use: No  . Sexual Activity: Not Currently   Other Topics Concern  . 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   Respiratory: Negative.   Cardiovascular: Negative.    Objective:  BP 120/78 mmHg  Pulse 83  Temp(Src) 98.2 F (36.8 C) (Oral)  Ht _0  (1.803 m)  Wt 165 lb 4 oz (74.957 kg)  BMI 23.06 kg/m2  SpO2 97%  BP/Weight 07/14/2015 06/23/2015 50/27/7412  Systolic BP 878 676 720  Diastolic BP 78 80 80  Wt. (Lbs) 165.25 165.4 167  BMI 23.06 24.41 23.3   Physical Exam    Constitutional: He is oriented to person, place, and time. He appears well-developed. No distress.  Cardiovascular: Normal rate and regular rhythm.   Pulmonary/Chest: Effort normal and breath sounds normal. He has no wheezes. He has no rales.  Abdominal: Soft. He exhibits no distension. There is no tenderness. There is no rebound and no guarding.  Neurological: He is alert and oriented to person, place, and time.  Psychiatric: He has a normal mood and affect.  Vitals reviewed. Diabetic Foot Check -  Appearance - no lesions, ulcers or calluses Skin - no unusual pallor or redness Monofilament testing -  Right - Great toe, medial, central, lateral ball and posterior foot intact Left - Great toe, medial, central, lateral ball and posterior foot intact  Lab Results  Component Value Date   WBC 5.1 04/08/2014   HGB 15.4 04/08/2014   HCT 45.3 04/08/2014   PLT 229.0 04/08/2014   GLUCOSE 138* 05/30/2012   CHOL 138 06/09/2014   TRIG 124.0 06/09/2014   HDL 35.10* 06/09/2014   LDLDIRECT 112.4 09/02/2010   LDLCALC 78 06/09/2014   ALT 24 05/30/2012   AST 21 05/30/2012   NA 139 05/30/2012   K 4.8 05/30/2012   CL 104 05/30/2012   CREATININE  1.2 05/30/2012   BUN 21 05/30/2012   CO2 27 05/30/2012   TSH 2.90 05/30/2012   PSA 3.56 05/08/2008   INR 2.6 05/24/2011   HGBA1C 6.9* 04/23/2015   MICROALBUR 5.1* 04/26/2015    Assessment & Plan:   Problem List Items Addressed This Visit    Type 2 diabetes mellitus without complication, without long-term current use of insulin ([place]) - Primary    Stable. At goal. Continue metformin and glipizide.      Relevant Orders   Comp Met (CMET)   Hyperlipidemia    Stable. Unclear why patient is not on statin. Will continue to monitor.      Relevant Orders   Lipid panel   Direct LDL   BPH (benign prostatic hyperplasia)    Doing well status post TURP. Relatively asymptomatic as he only gets up one time a night to urinate. Will continue to  follow.      Anti-phospholipid antibody syndrome (HCC)    Patient is been stable on xarelto and aspirin. Will continue.       Other Visit Diagnoses    Long term current use of anticoagulant therapy        Relevant Orders    CBC      Follow-up: 6 months.  [employer]

## 2015-07-14 NOTE — Assessment & Plan Note (Signed)
Patient is been stable on xarelto and aspirin. Will continue.

## 2015-07-14 NOTE — Assessment & Plan Note (Signed)
Stable. At goal. Continue metformin and glipizide.

## 2015-07-14 NOTE — Assessment & Plan Note (Signed)
Doing well status post TURP. Relatively asymptomatic as he only gets up one time a night to urinate. Will continue to follow.

## 2015-07-14 NOTE — Patient Instructions (Signed)
Continue occasions.  It was nice to see you today.  We will call with your lab results.  Take care  Dr. Lacinda Axon

## 2015-07-14 NOTE — Progress Notes (Signed)
Pre visit review using our clinic review tool, if applicable. No additional management support is needed unless otherwise documented below in the visit note. 

## 2015-07-14 NOTE — Assessment & Plan Note (Signed)
Well controlled.  Continue glipizide and metformin.

## 2015-08-17 ENCOUNTER — Encounter: Payer: Self-pay | Admitting: Family Medicine

## 2015-08-17 ENCOUNTER — Ambulatory Visit (INDEPENDENT_AMBULATORY_CARE_PROVIDER_SITE_OTHER): Payer: Medicare Other | Admitting: Family Medicine

## 2015-08-17 VITALS — BP 142/82 | HR 94 | Temp 97.7°F | Ht 71.0 in | Wt 163.5 lb

## 2015-08-17 DIAGNOSIS — R55 Syncope and collapse: Secondary | ICD-10-CM | POA: Diagnosis not present

## 2015-08-17 NOTE — Patient Instructions (Signed)
We will call as soon as we can with your appts.  No strenuous activity or driving.  Continue your current medications.  Follow up to be scheduled after specialists appts.  Take care  Dr. Lacinda Axon

## 2015-08-17 NOTE — Progress Notes (Signed)
Subjective:  Patient ID: Douglas Mercer, male    DOB: 1946/01/06  Age: 70 y.o. MRN: [id number]  CC: Syncope  HPI:  70 year old male with antiphospholipid syndrome, BPH, hyperlipidemia, DM 2 presents with recent episodes of passing out.  Patient accompanied by the wife today. She provides a significant amount of the history due to the nature of the complaint.  Patient's wife states that on Mother's Day he was at church and he became unresponsive. She states that he had some clonic activity of his left arm and his eyes were back in his head became unresponsive. She states that this lasted briefly. He was evaluated by EMS and was not taken in for evaluation.  On this past Sunday (6/25), he was once again at church. It was witnessed by his wife. He states that he was feeling fine. No prior palpitations or chest pain. Per the wife, she heard a crash. He fell directly back hitting the back of his head. She states that his eyes were rolled back and he was unresponsive for approximately 2 minutes. She reports increased tone but no clonic activity. There was no tongue injury or incontinence. He then came to. She states that he was not quite his normal self for another few minutes. By the time EMS arrived he was back to his normal self.  He was transported to a local hospital and was evaluated. She brought in a portion of his records today - Imaging, EKG, etc. EKG was reviewed today. Interpretation: Normal sinus rhythm with rate of 80. Normal axis. First-degree AV block. No ST or T-wave changes. Carotid Doppler with mild right carotid bulb plaque, stenosis less than 50% bilaterally. CT head negative. MRI brain with chronic small vessel changes, no acute findings. Chest x-ray read states on hyperinflation. No acute findings. 2-D echo revealed normal systolic function, diastolic dysfunction, trace aortic regurg, trace mitral regurg. No records about labs or other documentation.  Patient states that he's  feeling well today. No current complaints.  Social Hx   Social History   Social History  . Marital Status: Married    Spouse Name: N/A  . Number of Children: 0  . Years of Education: N/A   Occupational History  . Neurosurgeon   .     Social History Main Topics  . Smoking status: Never Smoker   . Smokeless tobacco: Never Used  . Alcohol Use: 0.0 oz/week    0 Standard drinks or equivalent per week     Comment: socially  . Drug Use: No  . Sexual Activity: Not Currently   Other Topics Concern  . 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   Respiratory: Negative.   Cardiovascular: Negative.   Neurological: Positive for syncope.   Objective:  BP 142/82 mmHg  Pulse 94  Temp(Src) 97.7 F (36.5 C) (Oral)  Ht 5\' 11"  (1.803 m)  Wt 163 lb 8 oz (74.163 kg)  BMI 22.81 kg/m2  SpO2 98%  BP/Weight 08/17/2015 [id number]  Systolic BP [id number]  Diastolic BP 82 78 80  Wt. (Lbs) 163.5 165.25 165.4  BMI 22.81 23.06 24.41   Physical Exam  Constitutional: He is oriented to person, place, and time. He  appears well-developed. No distress.  Cardiovascular: Normal rate and regular rhythm.   No murmur heard. Pulmonary/Chest: Effort normal. He has no wheezes. He has no rales.  Abdominal: Soft. He exhibits no distension. There is no tenderness.  Neurological: He is alert and oriented to person, place, and time.  No apparent focal deficits.  Psychiatric: He has a normal mood and affect.  Vitals reviewed.  Lab Results  Component Value Date   WBC 4.9 07/14/2015   HGB 15.2 07/14/2015   HCT 45.2 07/14/2015   PLT 212.0 07/14/2015   GLUCOSE 132* 07/14/2015   CHOL 134 07/14/2015   TRIG 112.0 07/14/2015   HDL 30.50*  07/14/2015   LDLDIRECT 85.0 07/14/2015   LDLCALC 81 07/14/2015   ALT 21 07/14/2015   AST 23 07/14/2015   NA 139 07/14/2015   K 4.2 07/14/2015   CL 106 07/14/2015   CREATININE 1.06 07/14/2015   BUN 17 07/14/2015   CO2 29 07/14/2015   TSH 2.90 05/30/2012   PSA 3.56 05/08/2008   INR 2.6 05/24/2011   HGBA1C 6.9* 04/23/2015   MICROALBUR 5.1* 04/26/2015   Assessment & Plan:   Problem List Items Addressed This Visit    Syncope - Primary    New problem. Unclear etiology and prognosis at this time. ? Seizure.  Given age and comorbidities, I am concerned about potential cardiac cause.  Workup negative thus far. See history of present illness. Sending to neuro for eval and EEG. Sending to cardiology for eval (would like event monitor).      Relevant Orders   Ambulatory referral to Cardiology   Ambulatory referral to Neurology      Follow-up: Pending specialists appts  [place]

## 2015-08-17 NOTE — Progress Notes (Signed)
Pre visit review using our clinic review tool, if applicable. No additional management support is needed unless otherwise documented below in the visit note. 

## 2015-08-17 NOTE — Assessment & Plan Note (Signed)
New problem. Unclear etiology and prognosis at this time. ? Seizure.  Given age and comorbidities, I am concerned about potential cardiac cause.  Workup negative thus far. See history of present illness. Sending to neuro for eval and EEG. Sending to cardiology for eval (would like event monitor).

## 2015-08-19 ENCOUNTER — Ambulatory Visit (INDEPENDENT_AMBULATORY_CARE_PROVIDER_SITE_OTHER): Payer: Medicare Other | Admitting: Neurology

## 2015-08-19 ENCOUNTER — Encounter: Payer: Self-pay | Admitting: Neurology

## 2015-08-19 VITALS — BP 142/84 | HR 95 | Ht 71.0 in | Wt 166.8 lb

## 2015-08-19 DIAGNOSIS — G40009 Localization-related (focal) (partial) idiopathic epilepsy and epileptic syndromes with seizures of localized onset, not intractable, without status epilepticus: Secondary | ICD-10-CM

## 2015-08-19 MED ORDER — LEVETIRACETAM 750 MG PO TABS: 750.0000 mg | ORAL_TABLET | Freq: Two times a day (BID) | ORAL | Status: DC

## 2015-08-19 NOTE — Patient Instructions (Signed)
Remember to drink plenty of fluid, eat healthy meals and do not skip any meals. Try to eat protein with a every meal and eat a healthy snack such as fruit or nuts in between meals. Try to keep a regular sleep-wake schedule and try to exercise daily, particularly in the form of walking, 20-30 minutes a day, if you can.   As far as your medications are concerned, I would like to suggest: Keppra 750mg  twice a day  As far as diagnostic testing: EEG in the office and then 3-day eeg  I would like to see you back in 3 months, sooner if we need to. Please call us with any interim questions, concerns, problems, updates or refill requests.   Our phone number is [phone]. We also have an after hours call service for urgent matters and there is a physician on-call for urgent questions. For any emergencies you know to call 911 or go to the nearest emergency room  Levetiracetam tablets What is this medicine? LEVETIRACETAM (lee ve tye RA se tam) is an antiepileptic drug. It is used with other medicines to treat certain types of seizures. This medicine may be used for other purposes; ask your health care provider or pharmacist if you have questions. What should I tell my health care provider before I take this medicine? They need to know if you have any of these conditions: -kidney disease -suicidal thoughts, plans, or attempt; a previous suicide attempt by you or a family member -an unusual or allergic reaction to levetiracetam, other medicines, foods, dyes, or preservatives -pregnant or trying to get pregnant -breast-feeding How should I use this medicine? Take this medicine by mouth with a glass of water. Follow the directions on the prescription label. Swallow the tablets whole. Do not crush or chew this medicine. You may take this medicine with or without food. Take your doses at regular intervals. Do not take your medicine more often than directed. Do not stop taking this medicine or any of your  seizure medicines unless instructed by your doctor or health care professional. Stopping your medicine suddenly can increase your seizures or their severity. A special MedGuide will be given to you by the pharmacist with each prescription and refill. Be sure to read this information carefully each time. Contact your pediatrician or health care professional regarding the use of this medication in children. While this drug may be prescribed for children as young as 62 years of age for selected conditions, precautions do apply. Overdosage: If you think you have taken too much of this medicine contact a poison control center or emergency room at once. NOTE: This medicine is only for you. Do not share this medicine with others. What if I miss a dose? If you miss a dose, take it as soon as you can. If it is almost time for your next dose, take only that dose. Do not take double or extra doses. What may interact with this medicine? This medicine may interact with the following medications: -carbamazepine -colesevelam -probenecid -sevelamer This list may not describe all possible interactions. Give your health care provider a list of all the medicines, herbs, non-prescription drugs, or dietary supplements you use. Also tell them if you smoke, drink alcohol, or use illegal drugs. Some items may interact with your medicine. What should I watch for while using this medicine? Visit your doctor or health care professional for a regular check on your progress. Wear a medical identification bracelet or chain to say you have epilepsy,  and carry a card that lists all your medications. It is important to take this medicine exactly as instructed by your health care professional. When first starting treatment, your dose may need to be adjusted. It may take weeks or months before your dose is stable. You should contact your doctor or health care professional if your seizures get worse or if you have any new types of  seizures. You may get drowsy or dizzy. Do not drive, use machinery, or do anything that needs mental alertness until you know how this medicine affects you. Do not stand or sit up quickly, especially if you are an older patient. This reduces the risk of dizzy or fainting spells. Alcohol may interfere with the effect of this medicine. Avoid alcoholic drinks. The use of this medicine may increase the chance of suicidal thoughts or actions. Pay special attention to how you are responding while on this medicine. Any worsening of mood, or thoughts of suicide or dying should be reported to your health care professional right away. Women who become pregnant while using this medicine may enroll in the [place] Pregnancy Registry by calling [id number]. This registry collects information about the safety of antiepileptic drug use during pregnancy. What side effects may I notice from receiving this medicine? Side effects you should report to your doctor or health care professional as soon as possible: -allergic reactions like skin rash, itching or hives, swelling of the face, lips, or tongue -breathing problems -dark urine -general ill feeling or flu-like symptoms -problems with balance, talking, walking -unusually weak or tired -worsening of mood, thoughts or actions of suicide or dying -yellowing of the eyes or skin Side effects that usually do not require medical attention (report to your doctor or health care professional if they continue or are bothersome): -diarrhea -dizzy, drowsy -headache -loss of appetite This list may not describe all possible side effects. Call your doctor for medical advice about side effects. You may report side effects to FDA at 1-800-FDA-1088. Where should I keep my medicine? Keep out of reach of children. Store at room temperature between 15 and 30 degrees C (59 and 86 degrees F). Throw away any unused medicine after the expiration date. NOTE:  This sheet is a summary. It may not cover all possible information. If you have questions about this medicine, talk to your doctor, pharmacist, or health care provider.    2016, Elsevier/Gold Standard. (2012-12-31 08:42:48)

## 2015-08-19 NOTE — Progress Notes (Signed)
[facility]    Provider:  Dr Jaynee Eagles Referring Provider: Coral Spikes, DO Primary Care Physician:  Coral Spikes, DO  CC:  Syncopal episode  HPI:  Douglas Mercer is a 70 y.o. male here as a referral from Dr. Lacinda Axon for syncopal episode. Past medical history diabetes and high cholesterol. He has had 2 episodes, first one on mother's day he had an episode, second event this past Sunday when he fell backwards and hit his head on he concrete floor. Per patient he does not remember anything, symptoms and all of a sudden he woke up on the floor. Wife is here and provides information: Both events happened at church. Per his wife, his left side started jerking, his eyes rolled back, he didn't respond for 30 seconds, she called the EMS on the first episode. No one saw the second episode start but found him slumped over. The second time, his eyes were rolled into his head, scared her to death, looked dead, could see the whites of his eyes. He had a tonic posture. Confusion afterwards. There were 2 nurses in attendance who both said looked like a seizure, duration less than a minute, he had hit his head on the concrete floor, no fracture. He was hospitalized at [facility]. No urination, no tongue biting. No previous history of seizures, he sometimes has uncontrollable shrugs and jerks. No personal history or family history of seizures. He has a history of meningitis. No inciting events or head trauma. No known triggering events. Nothing makes the symptoms worse or better. No other associated symptoms. No other focal neurologic deficits or complaints per patient and wife.  Reviewed notes, labs and imaging from outside physicians, which showed:  Patient has antiphospholipid syndrome, BPH, hyperlipidemia, diabetes and was seen in outpatient for history of syncope. Patient stated that on Mother's Day he was at church and became unresponsive, he has some chronic activity of his left arm and  became unresponsive, lasting briefly, he was evaluated by EMS and was not taken in for evaluation. On 625 he was at church, he says he was feeling fine, no prior palpitations or chest pain, he fell directly back hitting the back of his head, his eyes rolled back and he was unresponsive for approximately 2 minutes, no tongue injury incontinence, postictal confusion for a few minutes. He was transported to the local hospital and was evaluated. Review of records shows no other syncopal episodes reported in the past.  Ultrasonic carotid arteries with color Doppler showed mild right carotid bulb plaque. Bilateral stenosis of less than 50%. Date 08/16/2015.  MRI of the brain without contrast showed no acute intracranial pathology. No extra-axial fluid collection. No major territorial vascular infarctions. Ventricles, sulci and other CSF spaces demonstrates moderate cerebral and cerebellar parenchymal volume loss. Mild scattered. Ventricular and subcortical white matter T2 FLAIR hyperintensities are nonspecific findings likely related to sequelae of chronic small vessel ischemic changes. No evidence of mass, mass effect or midline shift. Fourth ventricle is midline. Major intracranial vascular flow voids appear preserved. Orbits are grossly intact. Performed 08/16/2015.  Echocardiogram showed grossly normal left ventricular size and systolic function, ejection fraction 50-60%. Impaired relaxation of diastolic filling pattern, trace amount of aortic regurgitation, trace mitral regurgitation, right ventricular systolic pressure as measured by Dopplers 21.54 mmHg.  Personally reviewed EKG tracing which showed normal sinus rhythm, normal axis, no ST or T-wave changes, first degree AV block.  CBC normal, CMP unremarkable with normal creatinine 1.06, elevated glucose 132.  Review of Systems: Patient complains of symptoms per HPI as well as the following symptoms: Snoring, hearing loss, urination problems, blood  in urine, passing out, snoring. Pertinent negatives per HPI. All others negative.   Social History   Social History  . Marital Status: Married    Spouse Name: Judson Roch  . Number of Children: 0  . Years of Education: 14   Occupational History  . Physiological scientist) Other   Social History Main Topics  . Smoking status: Never Smoker   . Smokeless tobacco: Never Used  . Alcohol Use: 0.0 oz/week    0 Standard drinks or equivalent per week     Comment: socially  . Drug Use: No  . Sexual Activity: Not Currently   Other Topics Concern  . 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 Diabetes Mother   . Melanoma Mother   . COPD Mother   . Heart attack Maternal Uncle   . Stroke Maternal Aunt     grandparents  . Colon cancer Paternal Uncle     x2  . Prostate cancer Neg Hx   . Seizures Neg Hx   . COPD Father     Past Medical History  Diagnosis Date  . Allergic rhinitis   . Hyperlipidemia   . Diabetes mellitus     type 2  . BPH (benign prostatic hypertrophy)   . Pulmonary embolism ([place]) 1992    bilateral w/ PE reportedly (+) lupus anticoagulant  . DVT (deep venous thrombosis) ([place]) 02/2009    large left lower  . ED (erectile dysfunction)   . Rosacea   . Tubular adenoma of colon   . Hypertension   . Internal hemorrhoids   . Anti-phospholipid antibody syndrome ([facility])   . GERD (gastroesophageal reflux disease)   . Glaucoma     Past Surgical History  Procedure Laterality Date  . Inguinal hernia repair  1995 /2011    right-sided, mesh placed  . Tonsillectomy    . Transurethral resection of prostate  08/13/09  . Colonoscopy  05/07/2006  . Diverticulosis   07/07/2002,04/19/2001    mild sigmoid  . Colonoscopy with polypectomy  07/07/2002    2 small descending colon polyps  . Colonoscopy with polypectomy  04/19/2001    20mm sessile mucosal polyp in distal sigmoid colon  . Esophagogastroduodenoscopy    . Prostate surgery      18 biopsies.  Benign.    Current Outpatient Prescriptions  Medication Sig Dispense Refill  . aspirin 81 MG tablet Take 81 mg by mouth daily.    Marland Kitchen co-enzyme Q-10 30 MG capsule Take 100 mg by mouth every morning.     . fenofibrate 160 MG tablet Take 1 tablet (160 mg total) by mouth daily. 90 tablet 2  . fish oil-omega-3 fatty acids 1000 MG capsule Take 2 g by mouth daily.      . fluticasone (FLONASE) 50 MCG/ACT nasal spray Place 2 sprays into the nose daily. (Patient taking differently: Place 2 sprays into the nose daily as needed. ) 48 g 0  . glipiZIDE (GLUCOTROL XL) 5  MG 24 hr tablet TAKE 1 TABLET(5 MG) BY MOUTH DAILY WITH BREAKFAST 90 tablet 1  . LUMIGAN 0.01 % SOLN INSTILL 1 DROP IN BOTH EYES AT BEDTIME  3  . metFORMIN (GLUCOPHAGE) 850 MG tablet Take 1 tablet (850 mg total) by mouth 2 (two) times daily with a meal. 180 tablet 1  . Multiple Vitamin (MULTIVITAMIN) capsule Diabetic health Pack 6-7 pills a day    . pantoprazole (PROTONIX) 40 MG tablet TAKE 1 TABLET(40 MG) BY MOUTH DAILY BEFORE BREAKFAST 90 tablet 1  . rivaroxaban (XARELTO) 20 MG TABS tablet Take 1 tablet (20 mg total) by mouth daily at 12 noon. 90 tablet 3  . tetracycline (ACHROMYCIN,SUMYCIN) 500 MG capsule TK 1 C PO BID  4  . levETIRAcetam (KEPPRA) 750 MG tablet Take 1 tablet (750 mg total) by mouth 2 (two) times daily. 60 tablet 11   No current facility-administered medications for this visit.    Allergies as of 08/19/2015  . (No Known Allergies)    Vitals: BP 142/84 mmHg  Pulse 95  Ht 5\' 11"  (1.803 m)  Wt 166 lb 12.8 oz (75.66 kg)  BMI 23.27 kg/m2 Last Weight:  Wt Readings from Last 1 Encounters:  08/19/15 166 lb 12.8 oz (75.66 kg)   Last  Height:   Ht Readings from Last 1 Encounters:  08/19/15 5\' 11"  (1.803 m)     Physical exam: Exam: Gen: NAD, conversant, well nourised, well groomed                     CV: RRR, no MRG. No Carotid Bruits. No peripheral edema, warm, nontender Eyes: Conjunctivae clear without exudates or hemorrhage  Neuro: Detailed Neurologic Exam  Speech:    Speech is normal; fluent and spontaneous with normal comprehension.  Cognition:    The patient is oriented to person, place, and time;     recent and remote memory intact;     language fluent;     normal attention, concentration,     fund of knowledge Cranial Nerves:    The pupils are equal, round, and reactive to light. Attempted funduscopic exam could not visualize due to small pupils. Visual fields are full to finger confrontation. Extraocular movements are intact. Trigeminal sensation is intact and the muscles of mastication are normal. The face is symmetric. The palate elevates in the midline. Hearing intact. Voice is normal. Shoulder shrug is normal. The tongue has normal motion without fasciculations.   Coordination:    Normal finger to nose and heel to shin. Normal rapid alternating movements.   Gait:    Heel-toe and tandem gait are normal.   Motor Observation:    No asymmetry, no atrophy, and no involuntary movements noted. Tone:    Normal muscle tone.    Posture:    Posture is normal. normal erect    Strength:    Strength is V/V in the upper and lower limbs.      Sensation: intact to LT     Reflex Exam:  DTR's:    Deep tendon reflexes in the upper and lower extremities are normal bilaterally.   Toes:    The toes are downgoing bilaterally.   Clonus:    Clonus is absent.     Assessment/Plan:  70 year old male with likely partial onset seizures, seizures started with left arm jerking, eyes rolled back, patient loses consciousness with tonic posturing, postictal confusion.  We'll start Keppra 750 mg twice daily.  Discussed side effects with patient and  wife, as per patient instructions.  Routine EEG and then 3 day EEG at home.  Patient is unable to drive, operate heavy machinery, perform activities at heights or participate in water activities until 6 months seizure free  Discussed Patients with epilepsy have a small risk of sudden unexpected death, a condition referred to as sudden unexpected death in epilepsy (SUDEP). SUDEP is defined specifically as the sudden, unexpected, witnessed or unwitnessed, nontraumatic and nondrowning death in patients with epilepsy with or without evidence for a seizure, and excluding documented status epilepticus, in which post mortem examination does not reveal a structural or toxicologic cause for death   F/u 4 months. Call if there are any other events so we can adjust medication.  Sarina Ill, MD  [facility] [address] [place], [place] [address]  Phone [phone] Fax [phone]

## 2015-09-01 ENCOUNTER — Ambulatory Visit (INDEPENDENT_AMBULATORY_CARE_PROVIDER_SITE_OTHER): Payer: Medicare Other

## 2015-09-01 DIAGNOSIS — G40009 Localization-related (focal) (partial) idiopathic epilepsy and epileptic syndromes with seizures of localized onset, not intractable, without status epilepticus: Secondary | ICD-10-CM

## 2015-09-01 NOTE — Procedures (Signed)
   HISTORY: 70 years old male, presented with passing out episode.  TECHNIQUE:  16 channel EEG was performed based on standard 10-16 international system. One channel was dedicated to EKG, which has demonstrates normal sinus rhythm of beats per minutes.  Upon awakening, the posterior background activity was well-developed, in alpha range, 9 Hz, reactive to eye opening and closure, there was intermittent asymmetry at front activities, left side has a higher amplitude dysrrhythmic theta range activity.  There was no evidence of epileptiform discharge.  Photic stimulation was performed, which induced a symmetric photic driving.  Hyperventilation was performed, there was no abnormality elicit other than mentioned above  Stage II sleep was achieved.  CONCLUSION: This is a mild abnormal awake and asleep EEG.  There is no electrodiagnostic evidence of epileptiform discharge, there is evidence of intermittent left frontal slowing, suggestive of focal irritability.  Marcial Pacas, M.D. Ph.D.  [facility] [place], [place] [address] Phone: [phone] Fax:      [phone]

## 2015-09-02 ENCOUNTER — Encounter: Payer: Self-pay | Admitting: Family Medicine

## 2015-09-03 ENCOUNTER — Other Ambulatory Visit: Payer: Self-pay | Admitting: Family Medicine

## 2015-09-03 MED ORDER — RIVAROXABAN 20 MG PO TABS: 20.0000 mg | ORAL_TABLET | Freq: Every day | ORAL | Status: DC

## 2015-09-03 NOTE — Telephone Encounter (Signed)
Last refilled on 07/08/2014 by [facility]. Pt was seen by you on 07/14/2015. And labs the same day. Please advise?

## 2015-09-06 ENCOUNTER — Telehealth: Payer: Self-pay | Admitting: *Deleted

## 2015-09-06 NOTE — Telephone Encounter (Signed)
Per Dr Jaynee Eagles, spoke with patient and informed him his EEG results showed an abnormality which could suggest a lowered seizure threshold. But there were no frank seizure-like events. Advised his that is only a brief test. Advised he continue the Keppra, confirmed he is taking one tab  Two times daily, and if he has another episode Dr Jaynee Eagles will order a 3-day eeg at home. Per Dr Cathren Laine office visit note, scheduled patient for 3 month FU. Advised if she requests to see him sooner, he will get a call back next week when she returns to the office. Otherwise, advised he call for any new episodes or problems.  He verbalized understanding, appreciation for call.

## 2015-09-09 ENCOUNTER — Other Ambulatory Visit: Payer: Medicare Other

## 2015-09-15 ENCOUNTER — Encounter: Payer: Self-pay | Admitting: Cardiology

## 2015-09-15 ENCOUNTER — Ambulatory Visit (INDEPENDENT_AMBULATORY_CARE_PROVIDER_SITE_OTHER): Payer: Medicare Other | Admitting: Cardiology

## 2015-09-15 VITALS — BP 140/72 | HR 78 | Ht 69.0 in | Wt 164.8 lb

## 2015-09-15 DIAGNOSIS — R55 Syncope and collapse: Secondary | ICD-10-CM

## 2015-09-15 NOTE — Patient Instructions (Addendum)
Testing/Procedures: Your physician has recommended that you wear an event monitor. Event monitors are medical devices that record the heart's electrical activity. Doctors most often [place] these monitors to diagnose arrhythmias. Arrhythmias are problems with the speed or rhythm of the heartbeat. The monitor is a small, portable device. You can wear one while you do your normal daily activities. This is usually used to diagnose what is causing palpitations/syncope (passing out).  This will be mailed to you. They will call prior to verify your mailing address. Please call if you have any questions.  [place]  Your caregiver has ordered a Stress Test with nuclear imaging. The purpose of this test is to evaluate the blood supply to your heart muscle. This procedure is referred to as a "Non-Invasive Stress Test." This is because other than having an IV started in your vein, nothing is inserted or "invades" your body. Cardiac stress tests are done to find areas of poor blood flow to the heart by determining the extent of coronary artery disease (CAD). Some patients exercise on a treadmill, which naturally increases the blood flow to your heart, while others who are  unable to walk on a treadmill due to physical limitations have a pharmacologic/chemical stress agent called Lexiscan . This medicine will mimic walking on a treadmill by temporarily increasing your coronary blood flow.   Please note: these test may take anywhere between 2-4 hours to complete  PLEASE REPORT TO [facility] AT THE FIRST DESK WILL DIRECT YOU WHERE TO GO  Date of Procedure:_Friday September 24, 2015 at 08:00AM_  Arrival Time for Procedure:__Arrive at 07:45AM to register__  Instructions regarding medication:   __X__ : Hold diabetes medication glipizide & metformin the morning of procedure    PLEASE NOTIFY THE OFFICE AT LEAST 24 HOURS IN ADVANCE IF YOU ARE UNABLE TO KEEP YOUR APPOINTMENT.   [phone] AND  PLEASE NOTIFY NUCLEAR MEDICINE AT [facility] AT LEAST 24 HOURS IN ADVANCE IF YOU ARE UNABLE TO KEEP YOUR APPOINTMENT. [phone]  How to prepare for your Myoview test:  1. Do not eat or drink after midnight 2. No caffeine for 24 hours prior to test 3. No smoking 24 hours prior to test. 4. Your medication may be taken with water.  If your doctor stopped a medication because of this test, do not take that medication. 5. Ladies, please do not wear dresses.  Skirts or pants are appropriate. Please wear a short sleeve shirt. 6. No perfume, cologne or lotion. 7. Wear comfortable walking shoes. No heels!   Follow-Up: Your physician recommends that you schedule a follow-up appointment as needed. We will call you with results of testing and if needed schedule a follow up at that time.  It was a pleasure seeing you today here in the office. Please do not hesitate to give [place] a call back if you have any further questions. [place], BSN      Any Other Special Instructions Will Be Listed Below (If Applicable).     If you need a refill on your cardiac medications before your next appointment, please call your pharmacy.   Cardiac Event Monitoring A cardiac event monitor is a small recording device used to help detect abnormal heart rhythms (arrhythmias). The monitor is used to record heart rhythm when noticeable symptoms such as the following occur:  Fast heartbeats (palpitations), such as heart racing or fluttering.  Dizziness.  Fainting or light-headedness.  Unexplained weakness. The monitor is  wired to two electrodes placed on your chest. Electrodes are flat, sticky disks that attach to your skin. The monitor can be worn for up to 30 days. You will wear the monitor at all times, except when bathing.  HOW TO USE YOUR CARDIAC EVENT MONITOR A technician will prepare your chest for the electrode placement. The technician will show you how to place the  electrodes, how to work the monitor, and how to replace the batteries. Take time to practice using the monitor before you leave the office. Make sure you understand how to send the information from the monitor to your health care provider. This requires a telephone with a landline, not a cell phone. You need to:  Wear your monitor at all times, except when you are in water:  Do not get the monitor wet.  Take the monitor off when bathing. Do not swim or use a hot tub with it on.  Keep your skin clean. Do not put body lotion or moisturizer on your chest.  Change the electrodes daily or any time they stop sticking to your skin. You might need to use tape to keep them on.  It is possible that your skin under the electrodes could become irritated. To keep this from happening, try to put the electrodes in slightly different places on your chest. However, they must remain in the area under your left breast and in the upper right section of your chest.  Make sure the monitor is safely clipped to your clothing or in a location close to your body that your health care provider recommends.  Press the button to record when you feel symptoms of heart trouble, such as dizziness, weakness, light-headedness, palpitations, thumping, shortness of breath, unexplained weakness, or a fluttering or racing heart. The monitor is always on and records what happened slightly before you pressed the button, so do not worry about being too late to get good information.  Keep a diary of your activities, such as walking, doing chores, and taking medicine. It is especially important to note what you were doing when you pushed the button to record your symptoms. This will help your health care provider determine what might be contributing to your symptoms. The information stored in your monitor will be reviewed by your health care provider alongside your diary entries.  Send the recorded information as recommended by your health  care provider. It is important to understand that it will take some time for your health care provider to process the results.  Change the batteries as recommended by your health care provider. SEEK IMMEDIATE MEDICAL CARE IF:   You have chest pain.  You have extreme difficulty breathing or shortness of breath.  You develop a very fast heartbeat that persists.  You develop dizziness that does not go away.  You faint or constantly feel you are about to faint.   This information is not intended to replace advice given to you by your health care provider. Make sure you discuss any questions you have with your health care provider.   Document Released: 11/16/2007 Document Revised: 02/27/2014 Document Reviewed: 08/05/2012 Elsevier Interactive Patient Education 2016 [employer].      Pharmacologic Stress Electrocardiogram A pharmacologic stress electrocardiogram is a heart (cardiac) test that uses nuclear imaging to evaluate the blood supply to your heart. This test may also be called a pharmacologic stress electrocardiography. Pharmacologic means that a medicine is used to increase your heart rate and blood pressure.  This stress test  is done to find areas of poor blood flow to the heart by determining the extent of coronary artery disease (CAD). Some people exercise on a treadmill, which naturally increases the blood flow to the heart. For those people unable to exercise on a treadmill, a medicine is used. This medicine stimulates your heart and will cause your heart to beat harder and more quickly, as if you were exercising.  Pharmacologic stress tests can help determine:  The adequacy of blood flow to your heart during increased levels of activity in order to clear you for discharge home.  The extent of coronary artery blockage caused by CAD.  Your prognosis if you have suffered a heart attack.  The effectiveness of cardiac procedures done, such as an angioplasty, which can increase  the circulation in your coronary arteries.  Causes of chest pain or pressure. LET [facility] CARE PROVIDER KNOW ABOUT:  Any allergies you have.  All medicines you are taking, including vitamins, herbs, eye drops, creams, and over-the-counter medicines.  Previous problems you or members of your family have had with the use of anesthetics.  Any blood disorders you have.  Previous surgeries you have had.  Medical conditions you have.  Possibility of pregnancy, if this applies.  If you are currently breastfeeding. RISKS AND COMPLICATIONS Generally, this is a safe procedure. However, as with any procedure, complications can occur. Possible complications include:  You develop pain or pressure in the following areas:  Chest.  Jaw or neck.  Between your shoulder blades.  Radiating down your left arm.  Headache.  Dizziness or light-headedness.  Shortness of breath.  Increased or irregular heartbeat.  Low blood pressure.  Nausea or vomiting.  Flushing.  Redness going up the arm and slight pain during injection of medicine.  Heart attack (rare). BEFORE THE PROCEDURE   Avoid all forms of caffeine for 24 hours before your test or as directed by your health care provider. This includes coffee, tea (even decaffeinated tea), caffeinated sodas, chocolate, cocoa, and certain pain medicines.  Follow your health care provider's instructions regarding eating and drinking before the test.  Take your medicines as directed at regular times with water unless instructed otherwise. Exceptions may include:  If you have diabetes, ask how you are to take your insulin or pills. It is common to adjust insulin dosing the morning of the test.  If you are taking beta-blocker medicines, it is important to talk to your health care provider about these medicines well before the date of your test. Taking beta-blocker medicines may interfere with the test. In some cases, these medicines need to  be changed or stopped 24 hours or more before the test.  If you wear a nitroglycerin patch, it may need to be removed prior to the test. Ask your health care provider if the patch should be removed before the test.  If you use an inhaler for any breathing condition, bring it with you to the test.  If you are an outpatient, bring a snack so you can eat right after the stress phase of the test.  Do not smoke for 4 hours prior to the test or as directed by your health care provider.  Do not apply lotions, powders, creams, or oils on your chest prior to the test.  Wear comfortable shoes and clothing. Let your health care provider know if you were unable to complete or follow the preparations for your test. PROCEDURE   Multiple patches (electrodes) will be put on your  chest. If needed, small areas of your chest may be shaved to get better contact with the electrodes. Once the electrodes are attached to your body, multiple wires will be attached to the electrodes, and your heart rate will be monitored.  An IV access will be started. A nuclear trace (isotope) is given. The isotope may be given intravenously, or it may be swallowed. Nuclear refers to several types of radioactive isotopes, and the nuclear isotope lights up the arteries so that the nuclear images are clear. The isotope is absorbed by your body. This results in low radiation exposure.  A resting nuclear image is taken to show how your heart functions at rest.  A medicine is given through the IV access.  A second scan is done about 1 hour after the medicine injection and determines how your heart functions under stress.  During this stress phase, you will be connected to an electrocardiogram machine. Your blood pressure and oxygen levels will be monitored. AFTER THE PROCEDURE   Your heart rate and blood pressure will be monitored after the test.  You may return to your normal schedule, including diet,activities, and medicines,  unless your health care provider tells you otherwise.   This information is not intended to replace advice given to you by your health care provider. Make sure you discuss any questions you have with your health care provider.   Document Released: 06/25/2008 Document Revised: 02/11/2013 Document Reviewed: 10/14/2012 Elsevier Interactive Patient Education [employer].

## 2015-09-15 NOTE — Progress Notes (Signed)
Cardiology Office Note   Date:  09/15/2015   ID:  Douglas Mercer, DOB [birth year], MRN [id number]  Referring Doctor:  Coral Spikes, DO   Cardiologist:   Wende Bushy, MD   Reason for consultation:  Chief Complaint  Patient presents with  . Other    Ref by Dr. Lacinda Axon for syncope , the patient also has an EEG.  Meds reviewed by the patient verbally. "doing well."       History of Present Illness: Douglas Mercer is a 70 y.o. male who presents for Evaluation of episodes of passing out. Patient is by himself today.  First episode was back in May 2017, Mother's Day. He remembers sitting down at the table. He was then noted to fall to the side and then became unresponsive for roughly 30 seconds. He does not recall having any symptoms of chest pain, shortness of breath, palpitations prior to falling sideways. When he came to, he felt otherwise okay.  Second episode was 08/15/2015, he was visiting [place], [place] with the wife. There were in church. He was then all of a sudden noted to fall backwards and fall to the ground hitting his head. He did not sustain any significant injuries. Prior to the fall, he did not recall having any lightheadedness, dizziness, chest pain, shortness of breath, palpitations. He was told that he was out for a couple of minutes. When he came to, he felt disoriented since the place was new to him he was only visiting the area. He eventually came back to his baseline.  Because patient was by himself today, review of medical records show: "Per his wife, his left side started jerking, his eyes rolled back, he didn't respond for 30 seconds, she called the EMS on the first episode. No one saw the second episode start but found him slumped over. The second time, his eyes were rolled into his head, scared her to death, looked dead, could see the whites of his eyes. He had a tonic posture. Confusion afterwards. There were 2 nurses in attendance who both said looked like a seizure,  duration less than a minute, he had hit his head on the concrete floor, no fracture. He was hospitalized at [facility]. No urination, no tongue biting. No previous history of seizures, he sometimes has uncontrollable shrugs and jerks. No personal history or family history of seizures. He has a history of meningitis. No inciting events or head trauma. No known triggering events. Nothing makes the symptoms worse or better. No other associated symptoms. No other focal neurologic deficits or complaints per patient and wife"  Patient denies fever, cough, colds, abdominal pain. No chest pain, shortness breath, palpitations. No PND, orthopnea, edema.  ROS:  Please see the history of present illness. Aside from mentioned under HPI, all other systems are reviewed and negative.     Past Medical History:  Diagnosis Date  . Allergic rhinitis   . Anti-phospholipid antibody syndrome (HCC)   . BPH (benign prostatic hypertrophy)   . Diabetes mellitus    type 2  . DVT (deep venous thrombosis) ([place]) 02/2009   large left lower  . ED (erectile dysfunction)   . GERD (gastroesophageal reflux disease)   . Glaucoma   . Hyperlipidemia   . Hypertension   . Internal hemorrhoids   . Pulmonary embolism ([place]) 1992   bilateral w/ PE reportedly (+) lupus anticoagulant  . Rosacea   . Tubular adenoma of colon     Past Surgical History:  Procedure Laterality Date  . COLONOSCOPY  05/07/2006  . colonoscopy with polypectomy  07/07/2002   2 small descending colon polyps  . colonoscopy with polypectomy  04/19/2001   26mm sessile mucosal polyp in distal sigmoid colon  . diverticulosis  07/07/2002,04/19/2001   mild sigmoid  . ESOPHAGOGASTRODUODENOSCOPY    . [place] /2011   right-sided, mesh placed  . PROSTATE SURGERY     18 biopsies.  Benign.  . TONSILLECTOMY    . TRANSURETHRAL RESECTION OF PROSTATE  08/13/09     reports that he has never smoked. He has never used smokeless tobacco. He  reports that he drinks alcohol. He reports that he does not use drugs.   family history includes COPD in his father and mother; Colon cancer in his paternal uncle; Diabetes in his mother; Heart attack in his maternal uncle; Melanoma in his mother; Stroke in his maternal aunt.   Outpatient Medications Prior to Visit  Medication Sig Dispense Refill  . aspirin 81 MG tablet Take 81 mg by mouth daily.    Marland Kitchen co-enzyme Q-10 30 MG capsule Take 100 mg by mouth every morning.     . fenofibrate 160 MG tablet Take 1 tablet (160 mg total) by mouth daily. 90 tablet 2  . fish oil-omega-3 fatty acids 1000 MG capsule Take 2 g by mouth daily.      . fluticasone (FLONASE) 50 MCG/ACT nasal spray Place 2 sprays into the nose daily. (Patient taking differently: Place 2 sprays into the nose daily as needed. ) 48 g 0  . glipiZIDE (GLUCOTROL XL) 5 MG 24 hr tablet TAKE 1 TABLET(5 MG) BY MOUTH DAILY WITH BREAKFAST 90 tablet 1  . levETIRAcetam (KEPPRA) 750 MG tablet Take 1 tablet (750 mg total) by mouth 2 (two) times daily. 60 tablet 11  . LUMIGAN 0.01 % SOLN INSTILL 1 DROP IN BOTH EYES AT BEDTIME  3  . metFORMIN (GLUCOPHAGE) 850 MG tablet Take 1 tablet (850 mg total) by mouth 2 (two) times daily with a meal. 180 tablet 1  . Multiple Vitamin (MULTIVITAMIN) capsule Diabetic health Pack 6-7 pills a day    . pantoprazole (PROTONIX) 40 MG tablet TAKE 1 TABLET(40 MG) BY MOUTH DAILY BEFORE BREAKFAST 90 tablet 1  . rivaroxaban (XARELTO) 20 MG TABS tablet Take 1 tablet (20 mg total) by mouth daily at 12 noon. 90 tablet 3  . tetracycline (ACHROMYCIN,SUMYCIN) 500 MG capsule TK 1 C PO BID  4   No facility-administered medications prior to visit.      Allergies: Review of patient's allergies indicates no known allergies.    PHYSICAL EXAM: VS:  BP 140/72 (BP Location: Left Arm, Patient Position: Sitting, Cuff Size: Normal)   Pulse 78   Ht 5\' 9"  (1.753 m)   Wt 164 lb 12 oz (74.7 kg)   BMI 24.33 kg/m  , Body mass index is 24.33  kg/m. Wt Readings from Last 3 Encounters:  09/15/15 164 lb 12 oz (74.7 kg)  08/19/15 166 lb 12.8 oz (75.7 kg)  08/17/15 163 lb 8 oz (74.2 kg)    GENERAL:  well developed, well nourished, not in acute distress HEENT: normocephalic, pink conjunctivae, anicteric sclerae, no xanthelasma, normal dentition, oropharynx clear NECK:  no neck vein engorgement, JVP normal, no hepatojugular reflux, carotid upstroke brisk and symmetric, no bruit, no thyromegaly, no lymphadenopathy LUNGS:  good respiratory effort, clear to auscultation bilaterally CV:  PMI not displaced, no thrills, no lifts, S1 and S2 within normal  limits, no palpable S3 or S4, no murmurs, no rubs, no gallops ABD:  Soft, nontender, nondistended, normoactive bowel sounds, no abdominal aortic bruit, no hepatomegaly, no splenomegaly MS: nontender back, no kyphosis, no scoliosis, no joint deformities EXT:  2+ DP/PT pulses, no edema, no varicosities, no cyanosis, no clubbing SKIN: warm, nondiaphoretic, normal turgor, no ulcers NEUROPSYCH: alert, oriented to person, place, and time, sensory/motor grossly intact, normal mood, appropriate affect  Recent Labs: 07/14/2015: ALT 21; BUN 17; Creatinine, Ser 1.06; Hemoglobin 15.2; Platelets 212.0; Potassium 4.2; Sodium 139   Lipid Panel    Component Value Date/Time   CHOL 134 07/14/2015 1049   TRIG 112.0 07/14/2015 1049   HDL 30.50 (L) 07/14/2015 1049   CHOLHDL 4 07/14/2015 1049   VLDL 22.4 07/14/2015 1049   LDLCALC 81 07/14/2015 1049   LDLDIRECT 85.0 07/14/2015 1049     Other studies Reviewed:  EKG:  The ekg from 09/15/2015 was personally reviewed by me and it revealed sinus rhythm, 79 BPM, otherwise normal EKG.  Additional studies/ records that were reviewed personally reviewed by me today include:  Echocardiogram 08/15/2015, [facility] [place] [facility]: LVEF 55-60%. LV diastolic filling pattern indicated and paired relaxation. Trace AI. Trace MR. RVSP 21.54 mmHg. Aortic valve is  trileaflet. Mild thickening of the anterior and posterior mitral valve leaflets.  Carotid ultrasound 08/15/2015, [facility]: Mild right carotid bulb plaque. Bilateral stenosis of less than 50%.  ASSESSMENT AND PLAN: Patient is being seen in cardiology to rule out possible cardiac etiology of episode of passing out/syncope. Patient is being seen by neurology and started to have" likely partial onset seizures, seizures started with left arm jerking, eyes rolled back, patient loses consciousness with tonic posturing, postictal confusion" Patient has risk factors for CAD including age, gender, diabetes Recommend pharmacologic nuclear stress test to rule out ischemia. Recommend a cardiac event monitor to rule out arrhythmia.   Current medicines are reviewed at length with the patient today.  The patient does not have concerns regarding medicines.  Labs/ tests ordered today include:  Orders Placed This Encounter  Procedures  . NM Myocar Multi W/Spect W/Wall Motion / EF  . Cardiac event monitor  . EKG 12-Lead    I had a lengthy and detailed discussion with the patient regarding diagnoses, prognosis, diagnostic options, treatment options.  I counseled the patient on importance of lifestyle modification including heart healthy diet, regular physical activity   I spent at least 45 minutes with the patient today and more than 50% of the time was spent counseling the patient and coordinating care.   Disposition:   FU with undersigned after tests prn   Signed, Wende Bushy, MD  09/15/2015 11:35 AM    [place]  This note was generated in part with voice recognition software and I apologize for any typographical errors that were not detected and corrected.

## 2015-09-16 ENCOUNTER — Other Ambulatory Visit: Payer: Self-pay | Admitting: Nurse Practitioner

## 2015-09-16 ENCOUNTER — Telehealth: Payer: Self-pay | Admitting: Cardiology

## 2015-09-16 NOTE — Telephone Encounter (Signed)
Pt calling stating he has some questions on upcoming apt. Stress test and monitor Please call back

## 2015-09-16 NOTE — Telephone Encounter (Signed)
Patient wanted to know about wearing the event monitor during the stress test. Instructed him that he can remove the monitor during his test and then reapply when finished. He verbalized understanding and had no further questions at this time.

## 2015-09-21 ENCOUNTER — Ambulatory Visit (INDEPENDENT_AMBULATORY_CARE_PROVIDER_SITE_OTHER): Payer: Medicare Other

## 2015-09-21 DIAGNOSIS — R55 Syncope and collapse: Secondary | ICD-10-CM

## 2015-09-24 ENCOUNTER — Encounter
Admission: RE | Admit: 2015-09-24 | Discharge: 2015-09-24 | Disposition: A | Discharge status: Home / Self Care | Payer: Medicare Other | Source: Ambulatory Visit | Attending: Cardiology | Admitting: Cardiology

## 2015-09-24 DIAGNOSIS — R55 Syncope and collapse: Secondary | ICD-10-CM | POA: Diagnosis present

## 2015-09-24 LAB — NM MYOCAR MULTI W/SPECT W/WALL MOTION / EF
CHL CUP NUCLEAR SDS: 0
CHL CUP NUCLEAR SRS: 8
CHL CUP NUCLEAR SSS: 2
CHL CUP STRESS STAGE 1 HR: 73 {beats}/min
CHL CUP STRESS STAGE 3 GRADE: 0 %
CHL CUP STRESS STAGE 3 HR: 82 {beats}/min
CHL CUP STRESS STAGE 3 SPEED: 0 mph
CHL CUP STRESS STAGE 4 HR: 106 {beats}/min
CHL CUP STRESS STAGE 5 SPEED: 0 mph
CSEPEW: 1 METS
CSEPPHR: 82 {beats}/min
LVDIAVOL: 98 mL (ref 62–150)
LVSYSVOL: 32 mL
NUC STRESS TID: 0.92
Percent HR: 70 %
Percent of predicted max HR: 54 %
Rest HR: 70 {beats}/min
Stage 1 Grade: 0 %
Stage 1 Speed: 0 mph
Stage 2 Grade: 0 %
Stage 2 HR: 73 {beats}/min
Stage 2 Speed: 0 mph
Stage 4 Grade: 0 %
Stage 4 Speed: 0 mph
Stage 5 DBP: 70 mmHg
Stage 5 Grade: 0 %
Stage 5 HR: 104 {beats}/min
Stage 5 SBP: 153 mmHg

## 2015-09-24 MED ORDER — TECHNETIUM TC 99M TETROFOSMIN IV KIT
32.5690 | PACK | Freq: Once | INTRAVENOUS | Status: AC | PRN
  Administered 2015-09-24: 32.569 via INTRAVENOUS

## 2015-09-24 MED ORDER — REGADENOSON 0.4 MG/5ML IV SOLN
0.4000 mg | Freq: Once | INTRAVENOUS | Status: AC
  Administered 2015-09-24: 0.4 mg via INTRAVENOUS

## 2015-09-24 MED ORDER — TECHNETIUM TC 99M TETROFOSMIN IV KIT
10.0000 | PACK | Freq: Once | INTRAVENOUS | Status: AC | PRN
  Administered 2015-09-24: 13.15 via INTRAVENOUS

## 2015-09-29 ENCOUNTER — Encounter: Payer: Self-pay | Admitting: Neurology

## 2015-11-04 NOTE — Progress Notes (Signed)
Cardiology Office Note   Date:  11/08/2015   ID:  Douglas Mercer, DOB Dec 26, 1945, MRN [place]:[id number]  Referring Doctor:  Coral Spikes, DO   Cardiologist:   Wende Bushy, MD   Reason for consultation:  Chief Complaint  Patient presents with  . other    F/u echo, stress test and event monitor. Meds reviewed verbally with pt.      History of Present Illness: Douglas Mercer is a 70 y.o. male who presents for  Follow-up after testing  First episode  Of passing out was back in May 2017, Mother's Day. He remembers sitting down at the table. He was then noted to fall to the side and then became unresponsive for roughly 30 seconds. He does not recall having any symptoms of chest pain, shortness of breath, palpitations prior to falling sideways. When he came to, he felt otherwise okay.  Second episode was 08/15/2015, he was visiting [place], [place] with the wife. There were in church. He was then all of a sudden noted to fall backwards and fall to the ground hitting his head. He did not sustain any significant injuries. Prior to the fall, he did not recall having any lightheadedness, dizziness, chest pain, shortness of breath, palpitations. He was told that he was out for a couple of minutes. When he came to, he felt disoriented since the place was new to him he was only visiting the area. He eventually came back to his baseline.  review of medical records show: "Per his wife, his left side started jerking, his eyes rolled back, he didn't respond for 30 seconds, she called the EMS on the first episode. No one saw the second episode start but found him slumped over. The second time, his eyes were rolled into his head, scared her to death, looked dead, could see the whites of his eyes. He had a tonic posture. Confusion afterwards. There were 2 nurses in attendance who both said looked like a seizure, duration less than a minute, he had hit his head on the concrete floor, no fracture. He was hospitalized at  [facility]. No urination, no tongue biting. No previous history of seizures, he sometimes has uncontrollable shrugs and jerks. No personal history or family history of seizures. He has a history of meningitis. No inciting events or head trauma. No known triggering events. Nothing makes the symptoms worse or better. No other associated symptoms. No other focal neurologic deficits or complaints per patient and wife"   per neurologist, his presentation was consistent with seizure. He has continued to take antiseizure medication.  He is due for follow-up with them. Mention of repeat EEG may be possible  Patient denies fever, cough, colds, abdominal pain. No chest pain, shortness breath, palpitations. No PND, orthopnea, edema.  ROS:  Please see the history of present illness. Aside from mentioned under HPI, all other systems are reviewed and negative.     Past Medical History:  Diagnosis Date  . Allergic rhinitis   . Anti-phospholipid antibody syndrome (HCC)   . BPH (benign prostatic hypertrophy)   . Diabetes mellitus    type 2  . DVT (deep venous thrombosis) ([place]) 02/2009   large left lower  . ED (erectile dysfunction)   . GERD (gastroesophageal reflux disease)   . Glaucoma   . Hyperlipidemia   . Hypertension   . Internal hemorrhoids   . Pulmonary embolism ([place]) 1992   bilateral w/ PE reportedly (+) lupus anticoagulant  . Rosacea   .  Tubular adenoma of colon     Past Surgical History:  Procedure Laterality Date  . COLONOSCOPY  05/07/2006  . colonoscopy with polypectomy  07/07/2002   2 small descending colon polyps  . colonoscopy with polypectomy  04/19/2001   69mm sessile mucosal polyp in distal sigmoid colon  . diverticulosis  07/07/2002,04/19/2001   mild sigmoid  . ESOPHAGOGASTRODUODENOSCOPY    . [place] /2011   right-sided, mesh placed  . PROSTATE SURGERY     18 biopsies.  Benign.  . TONSILLECTOMY    . TRANSURETHRAL RESECTION OF PROSTATE  08/13/09      reports that he has never smoked. He has never used smokeless tobacco. He reports that he drinks alcohol. He reports that he does not use drugs.   family history includes COPD in his father and mother; Colon cancer in his paternal uncle; Diabetes in his mother; Heart attack in his maternal uncle; Melanoma in his mother; Stroke in his maternal aunt.   Outpatient Medications Prior to Visit  Medication Sig Dispense Refill  . aspirin 81 MG tablet Take 81 mg by mouth daily.    Marland Kitchen co-enzyme Q-10 30 MG capsule Take 100 mg by mouth every morning.     . fenofibrate 160 MG tablet Take 1 tablet (160 mg total) by mouth daily. 90 tablet 2  . fish oil-omega-3 fatty acids 1000 MG capsule Take 2 g by mouth daily.      . fluticasone (FLONASE) 50 MCG/ACT nasal spray Place 2 sprays into the nose daily. (Patient taking differently: Place 2 sprays into the nose daily as needed. ) 48 g 0  . glipiZIDE (GLUCOTROL XL) 5 MG 24 hr tablet TAKE 1 TABLET(5 MG) BY MOUTH DAILY WITH BREAKFAST 90 tablet 1  . levETIRAcetam (KEPPRA) 750 MG tablet Take 1 tablet (750 mg total) by mouth 2 (two) times daily. 60 tablet 11  . LUMIGAN 0.01 % SOLN INSTILL 1 DROP IN BOTH EYES AT BEDTIME  3  . metFORMIN (GLUCOPHAGE) 850 MG tablet Take 1 tablet (850 mg total) by mouth 2 (two) times daily with a meal. 180 tablet 1  . Multiple Vitamin (MULTIVITAMIN) capsule Diabetic health Pack 6-7 pills a day    . pantoprazole (PROTONIX) 40 MG tablet TAKE 1 TABLET(40 MG) BY MOUTH DAILY BEFORE BREAKFAST 90 tablet 0  . rivaroxaban (XARELTO) 20 MG TABS tablet Take 1 tablet (20 mg total) by mouth daily at 12 noon. 90 tablet 3  . tetracycline (ACHROMYCIN,SUMYCIN) 500 MG capsule TK 1 C PO BID  4   No facility-administered medications prior to visit.      Allergies: Review of patient's allergies indicates no known allergies.    PHYSICAL EXAM: VS:  BP 130/80 (BP Location: Left Arm, Patient Position: Sitting, Cuff Size: Normal)   Pulse 100   Ht 5' 9.5"  (1.765 m)   Wt 163 lb 8 oz (74.2 kg)   BMI 23.80 kg/m  , Body mass index is 23.8 kg/m. Wt Readings from Last 3 Encounters:  11/08/15 163 lb 8 oz (74.2 kg)  09/15/15 164 lb 12 oz (74.7 kg)  08/19/15 166 lb 12.8 oz (75.7 kg)    GENERAL:  well developed, well nourished, not in acute distress HEENT: normocephalic, pink conjunctivae, anicteric sclerae, no xanthelasma, normal dentition, oropharynx clear NECK:  no neck vein engorgement, JVP normal, no hepatojugular reflux, carotid upstroke brisk and symmetric, no bruit, no thyromegaly, no lymphadenopathy LUNGS:  good respiratory effort, clear to auscultation bilaterally CV:  PMI not displaced, no thrills, no lifts, S1 and S2 within normal limits, no palpable S3 or S4, no murmurs, no rubs, no gallops ABD:  Soft, nontender, nondistended, normoactive bowel sounds, no abdominal aortic bruit, no hepatomegaly, no splenomegaly MS: nontender back, no kyphosis, no scoliosis, no joint deformities EXT:  2+ DP/PT pulses, no edema, no varicosities, no cyanosis, no clubbing SKIN: warm, nondiaphoretic, normal turgor, no ulcers NEUROPSYCH: alert, oriented to person, place, and time, sensory/motor grossly intact, normal mood, appropriate affect  Recent Labs: 07/14/2015: ALT 21; BUN 17; Creatinine, Ser 1.06; Hemoglobin 15.2; Platelets 212.0; Potassium 4.2; Sodium 139   Lipid Panel    Component Value Date/Time   CHOL 134 07/14/2015 1049   TRIG 112.0 07/14/2015 1049   HDL 30.50 (L) 07/14/2015 1049   CHOLHDL 4 07/14/2015 1049   VLDL 22.4 07/14/2015 1049   LDLCALC 81 07/14/2015 1049   LDLDIRECT 85.0 07/14/2015 1049     Other studies Reviewed:  EKG:  The ekg from 09/15/2015 was personally reviewed by me and it revealed sinus rhythm, 79 BPM, otherwise normal EKG.  Additional studies/ records that were reviewed personally reviewed by me today include:  Echocardiogram 08/15/2015, [facility]: LVEF 55-60%. LV diastolic filling pattern indicated and  paired relaxation. Trace AI. Trace MR. RVSP 21.54 mmHg. Aortic valve is trileaflet. Mild thickening of the anterior and posterior mitral valve leaflets.  Carotid ultrasound 08/15/2015, [facility]: Mild right carotid bulb plaque. Bilateral stenosis of less than 50%.  Nuclear stress test 09/24/2015:  There was no ST segment deviation noted during stress.  No T wave inversion was noted during stress.  The study is normal.  This is a low risk study.  Calculated EF was 41% but visually appears normal. Correlate with echo.  Event monitor 10/27/2015: Event monitor  Baseline rhythm was sinus. Note of 1st degree AV block occasionally.  Heart rate ranged from 57 to 154bpm.   Sinus rhythm with 1st degree av block and 2:1 av block, heart rate of 40bpm, was automatically detected on 10/05/2015 2:33am. No symptom recorded.  No other remarkable events recorded  ASSESSMENT AND PLAN: Patient is being seen in cardiology to rule out possible cardiac etiology of episode of passing out/syncope. Patient is being seen by neurology and started to have" likely partial onset seizures, seizures started with left arm jerking, eyes rolled back, patient loses consciousness with tonic posturing, postictal confusion" Patient has risk factors for CAD including age, gender, diabetes  no evidence of ischemia on pharmacologic nuclear stress test . Likelihood of clinically significant CAD is low. Patient verbalized understanding. Continue with risk factor modification.  cardiac event monitor  Revealed episode of sinus rhythm, first-degree AV block, 2-1 AV block, ventricular rate of 40 bpm. We'll this was noted a 15 2017-30 3 AM, patient was asleep. No other episodes of arrhythmia noted. This event was likely vagally mediated. Recommend further evaluation with sleep study to rule out OSA. Patient would like to think about this and discuss with PCP. If any other concern for possible arrhythmia as cause of his  episodes, we can refer to EP for ILR or implantable loop recorder. As of the moment, patient reports that neurologist is convinced that his symptomatology was more consistent with seizure episode.   Current medicines are reviewed at length with the patient today.  The patient does not have concerns regarding medicines.  Labs/ tests ordered today include:  No orders of the defined types were placed in this encounter.   I had a  lengthy and detailed discussion with the patient regarding diagnoses, prognosis, diagnostic options, treatment options.  I counseled the patient on importance of lifestyle modification including heart healthy diet, regular physical activity     Disposition:   FU with undersigned prn  I spent at least 25 minutes with the patient today and more than 50% of the time was spent counseling the patient and coordinating care.      Signed, Wende Bushy, MD  11/08/2015 1:09 PM    [place]  This note was generated in part with voice recognition software and I apologize for any typographical errors that were not detected and corrected.

## 2015-11-08 ENCOUNTER — Encounter: Payer: Self-pay | Admitting: Cardiology

## 2015-11-08 ENCOUNTER — Ambulatory Visit (INDEPENDENT_AMBULATORY_CARE_PROVIDER_SITE_OTHER): Payer: Medicare Other | Admitting: Cardiology

## 2015-11-08 VITALS — BP 130/80 | HR 100 | Ht 69.5 in | Wt 163.5 lb

## 2015-11-08 DIAGNOSIS — R55 Syncope and collapse: Secondary | ICD-10-CM | POA: Diagnosis not present

## 2015-11-08 NOTE — Patient Instructions (Signed)
Follow-Up: Your physician recommends that you schedule a follow-up appointment as needed.   It was a pleasure seeing you today here in the office. Please do not hesitate to give us a call back if you have any further questions. [phone]  Keon Benscoter A. RN, BSN    

## 2015-11-14 ENCOUNTER — Other Ambulatory Visit: Payer: Self-pay | Admitting: Nurse Practitioner

## 2015-11-18 ENCOUNTER — Ambulatory Visit (INDEPENDENT_AMBULATORY_CARE_PROVIDER_SITE_OTHER): Payer: Medicare Other | Admitting: Neurology

## 2015-11-18 VITALS — BP 127/79 | HR 103 | Ht 69.5 in | Wt 165.0 lb

## 2015-11-18 DIAGNOSIS — G40209 Localization-related (focal) (partial) symptomatic epilepsy and epileptic syndromes with complex partial seizures, not intractable, without status epilepticus: Secondary | ICD-10-CM

## 2015-11-18 DIAGNOSIS — G40109 Localization-related (focal) (partial) symptomatic epilepsy and epileptic syndromes with simple partial seizures, not intractable, without status epilepticus: Secondary | ICD-10-CM | POA: Diagnosis not present

## 2015-11-18 NOTE — Progress Notes (Signed)
[facility]   Provider:  Dr Jaynee Eagles Referring Provider: Coral Spikes, DO Primary Care Physician:  Coral Spikes, DO  CC:  Syncopal episode  Interval history: Patient is doing well. Wife is driving. He can drive in December if no events. No events. No side effects of the medication. Discussed the EEG below. Discussed seizures. Patient doing well.   CONCLUSION: This is a mild abnormal awake and asleep EEG.  There is no electrodiagnostic evidence of epileptiform discharge, there is evidence of intermittent left frontal slowing, suggestive of focal irritability.  HPI:  Douglas Mercer is a 70 y.o. male here as a referral from Dr. Lacinda Axon for syncopal episode. Past medical history diabetes and high cholesterol. He has had 2 episodes, first one on mother's day he had an episode, second event this past Sunday when he fell backwards and hit his head on he concrete floor. Per patient he does not remember anything, symptoms and all of a sudden he woke up on the floor. Wife is here and provides information: Both events happened at church. Per his wife, his left side started jerking, his eyes rolled back, he didn't respond for 30 seconds, she called the EMS on the first episode. No one saw the second episode start but found him slumped over. The second time, his eyes were rolled into his head, scared her to death, looked dead, could see the whites of his eyes. He had a tonic posture. Confusion afterwards. There were 2 nurses in attendance who both said looked like a seizure, duration less than a minute, he had hit his head on the concrete floor, no fracture. He was hospitalized at [facility]. No urination, no tongue biting. No previous history of seizures, he sometimes has uncontrollable shrugs and jerks. No personal history or family history of seizures. He has a history of meningitis. No inciting events or head trauma. No known triggering events. Nothing makes the symptoms worse or  better. No other associated symptoms. No other focal neurologic deficits or complaints per patient and wife.  Reviewed notes, labs and imaging from outside physicians, which showed:  Patient has antiphospholipid syndrome, BPH, hyperlipidemia, diabetes and was seen in outpatient for history of syncope. Patient stated that on Mother's Day he was at church and became unresponsive, he has some chronic activity of his left arm and became unresponsive, lasting briefly, he was evaluated by EMS and was not taken in for evaluation. On 625 he was at church, he says he was feeling fine, no prior palpitations or chest pain, he fell directly back hitting the back of his head, his eyes rolled back and he was unresponsive for approximately 2 minutes, no tongue injury incontinence, postictal confusion for a few minutes. He was transported to the local hospital and was evaluated. Review of records shows no other syncopal episodes reported in the past.  Ultrasonic carotid arteries with color Doppler showed mild right carotid bulb plaque. Bilateral stenosis of less than 50%. Date 08/16/2015.  MRI of the brain without contrast showed no acute intracranial pathology. No extra-axial fluid collection. No major territorial vascular infarctions. Ventricles, sulci and other CSF spaces demonstrates moderate cerebral and cerebellar parenchymal volume loss. Mild scattered. Ventricular and subcortical white matter T2 FLAIR hyperintensities are nonspecific findings likely related to sequelae of chronic small vessel ischemic changes. No evidence of mass, mass effect or midline shift. Fourth ventricle is midline. Major intracranial vascular flow voids appear preserved. Orbits are grossly intact. Performed 08/16/2015.  Echocardiogram showed grossly normal  left ventricular size and systolic function, ejection fraction 50-60%. Impaired relaxation of diastolic filling pattern, trace amount of aortic regurgitation, trace mitral  regurgitation, right ventricular systolic pressure as measured by Dopplers 21.54 mmHg.  Personally reviewed EKG tracing which showed normal sinus rhythm, normal axis, no ST or T-wave changes, first degree AV block.  CBC normal, CMP unremarkable with normal creatinine 1.06, elevated glucose 132.  Review of Systems: Patient complains of symptoms per HPI as well as the following symptoms: Snoring, hearing loss, urination problems, blood in urine, passing out, snoring. Pertinent negatives per HPI. All others negative.  Social History   Social History  . Marital status: Married    Spouse name: Judson Roch  . Number of children: 0  . Years of education: 14   Occupational History  . Physiological scientist) Other   Social History Main Topics  . Smoking status: Never Smoker  . Smokeless tobacco: Never Used  . Alcohol use 0.0 oz/week     Comment: socially  . Drug use: No  . Sexual activity: Not Currently   Other Topics Concern  . 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 Diabetes Mother   . Melanoma Mother   . COPD Mother   . COPD Father   . Heart attack Maternal Uncle   . Stroke Maternal Aunt     grandparents  . Colon cancer Paternal Uncle     x2  . Prostate cancer Neg Hx   . Seizures Neg Hx     Past Medical History:  Diagnosis Date  . Allergic rhinitis   . Anti-phospholipid antibody syndrome (HCC)   . BPH (benign prostatic hypertrophy)   . Diabetes mellitus    type 2  . DVT (deep venous thrombosis) ([place]) 02/2009   large left lower  . ED (erectile dysfunction)   . GERD (gastroesophageal reflux disease)   . Glaucoma   . Hyperlipidemia   . Hypertension     . Internal hemorrhoids   . Pulmonary embolism ([place]) 1992   bilateral w/ PE reportedly (+) lupus anticoagulant  . Rosacea   . Tubular adenoma of colon     Past Surgical History:  Procedure Laterality Date  . COLONOSCOPY  05/07/2006  . colonoscopy with polypectomy  07/07/2002   2 small descending colon polyps  . colonoscopy with polypectomy  04/19/2001   81mm sessile mucosal polyp in distal sigmoid colon  . diverticulosis  07/07/2002,04/19/2001   mild sigmoid  . ESOPHAGOGASTRODUODENOSCOPY    . [place] /2011   right-sided, mesh placed  . PROSTATE SURGERY     18 biopsies.  Benign.  . TONSILLECTOMY    . TRANSURETHRAL RESECTION OF PROSTATE  08/13/09    Current Outpatient Prescriptions  Medication Sig Dispense Refill  . aspirin 81 MG tablet Take 81 mg by mouth daily.    Marland Kitchen co-enzyme Q-10 30 MG capsule Take 100 mg by mouth every morning.     . fenofibrate 160 MG tablet Take 1 tablet (160 mg total) by mouth daily. 90 tablet 2  . fish oil-omega-3 fatty acids 1000 MG  capsule Take 2 g by mouth daily.      . fluticasone (FLONASE) 50 MCG/ACT nasal spray Place 2 sprays into the nose daily. (Patient taking differently: Place 2 sprays into the nose daily as needed. ) 48 g 0  . glipiZIDE (GLUCOTROL XL) 5 MG 24 hr tablet TAKE 1 TABLET(5 MG) BY MOUTH DAILY WITH BREAKFAST 90 tablet 1  . levETIRAcetam (KEPPRA) 750 MG tablet Take 1 tablet (750 mg total) by mouth 2 (two) times daily. 60 tablet 11  . LUMIGAN 0.01 % SOLN INSTILL 1 DROP IN BOTH EYES AT BEDTIME  3  . metFORMIN (GLUCOPHAGE) 850 MG tablet Take 1 tablet (850 mg total) by mouth 2 (two) times daily with a meal. 180 tablet 1  . Multiple Vitamin (MULTIVITAMIN) capsule Diabetic health Pack 6-7 pills a day    . pantoprazole (PROTONIX) 40 MG tablet TAKE 1 TABLET(40 MG) BY MOUTH DAILY BEFORE BREAKFAST 90 tablet 0  . rivaroxaban (XARELTO) 20 MG TABS tablet Take 1 tablet (20 mg total) by mouth daily at 12 noon. 90 tablet 3  .  tetracycline (ACHROMYCIN,SUMYCIN) 500 MG capsule TK 1 C PO BID  4   No current facility-administered medications for this visit.     Allergies as of 11/18/2015  . (No Known Allergies)    Vitals: BP 127/79 (BP Location: Right Arm, Patient Position: Sitting, Cuff Size: Normal)   Pulse (!) 103   Ht 5' 9.5" (1.765 m)   Wt 165 lb (74.8 kg)   BMI 24.02 kg/m  Last Weight:  Wt Readings from Last 1 Encounters:  11/18/15 165 lb (74.8 kg)   Last Height:   Ht Readings from Last 1 Encounters:  11/18/15 5' 9.5" (1.765 m)    Physical exam: Exam: Gen: NAD, conversant, well nourised, well groomed                     CV: RRR, no MRG. No Carotid Bruits. No peripheral edema, warm, nontender Eyes: Conjunctivae clear without exudates or hemorrhage  Neuro: Detailed Neurologic Exam  Speech:    Speech is normal; fluent and spontaneous with normal comprehension.  Cognition:    The patient is oriented to person, place, and time;     recent and remote memory intact;     language fluent;     normal attention, concentration,     fund of knowledge Cranial Nerves:    The pupils are equal, round, and reactive to light. Attempted funduscopic exam could not visualize due to small pupils. Visual fields are full to finger confrontation. Extraocular movements are intact. Trigeminal sensation is intact and the muscles of mastication are normal. The face is symmetric. The palate elevates in the midline. Hearing intact. Voice is normal. Shoulder shrug is normal. The tongue has normal motion without fasciculations.   Coordination:    Normal finger to nose and heel to shin. Normal rapid alternating movements.   Gait:    Heel-toe and tandem gait are normal.   Motor Observation:    No asymmetry, no atrophy, and no involuntary movements noted. Tone:    Normal muscle tone.    Posture:    Posture is normal. normal erect    Strength:    Strength is V/V in the upper and lower limbs.      Sensation:  intact to LT     Reflex Exam:  DTR's:    Deep tendon reflexes in the upper and lower extremities are normal bilaterally.   Toes:  The toes are downgoing bilaterally.   Clonus:    Clonus is absent.     Assessment/Plan:  69 year old male with likely partial onset seizures, seizures started with left arm jerking, eyes rolled back, patient loses consciousness with tonic posturing, postictal confusion.  We'll continue Keppra 750 mg twice daily. Discussed side effects with patient and wife, as per patient instructions.  Routine EEG was abnormal  Patient is unable to drive, operate heavy machinery, perform activities at heights or participate in water activities until 6 months seizure free  Discussed Patients with epilepsy have a small risk of sudden unexpected death, a condition referred to as sudden unexpected death in epilepsy (SUDEP). SUDEP is defined specifically as the sudden, unexpected, witnessed or unwitnessed, nontraumatic and nondrowning death in patients with epilepsy with or without evidence for a seizure, and excluding documented status epilepticus, in which post mortem examination does not reveal a structural or toxicologic cause for death   F/u 6-9 months. Call if there are any other events so we can adjust medication.  Sarina Ill, MD  [facility] [address] [place], [place] [address]  Phone [phone] Fax [phone] Sarina Ill, MD  [facility] [address] [place], [place] [address]  Phone [phone] Fax [phone]  A total of 20 minutes was spent face-to-face with this patient. Over half this time was spent on counseling patient on the partial onset seizure diagnosis and different diagnostic and therapeutic options available.

## 2015-11-18 NOTE — Patient Instructions (Addendum)
Remember to drink plenty of fluid, eat healthy meals and do not skip any meals. Try to eat protein with a every meal and eat a healthy snack such as fruit or nuts in between meals. Try to keep a regular sleep-wake schedule and try to exercise daily, particularly in the form of walking, 20-30 minutes a day, if you can.   As far as your medications are concerned, I would like to suggest: Continue Keppra  I would like to see you back in 6-9 months, sooner if we need to. Please call us with any interim questions, concerns, problems, updates or refill requests.   Our phone number is [phone]. We also have an after hours call service for urgent matters and there is a physician on-call for urgent questions. For any emergencies you know to call 911 or go to the nearest emergency room

## 2015-11-19 DIAGNOSIS — G40209 Localization-related (focal) (partial) symptomatic epilepsy and epileptic syndromes with complex partial seizures, not intractable, without status epilepticus: Secondary | ICD-10-CM | POA: Insufficient documentation

## 2015-11-19 DIAGNOSIS — G40109 Localization-related (focal) (partial) symptomatic epilepsy and epileptic syndromes with simple partial seizures, not intractable, without status epilepticus: Principal | ICD-10-CM

## 2015-11-25 ENCOUNTER — Telehealth: Payer: Self-pay | Admitting: Family Medicine

## 2015-11-25 ENCOUNTER — Other Ambulatory Visit: Payer: Self-pay | Admitting: Family Medicine

## 2015-11-25 DIAGNOSIS — E119 Type 2 diabetes mellitus without complications: Secondary | ICD-10-CM

## 2015-11-25 NOTE — Telephone Encounter (Signed)
Pt called and was asking if he needs to come in for his A1c check before he see Dr. Lacinda Axon in November? Please advise, thank you!  Call pt @ [phone]

## 2015-11-25 NOTE — Telephone Encounter (Signed)
Yes.  Orders placed.

## 2015-11-26 NOTE — Telephone Encounter (Signed)
Please schedule pt for labs.

## 2015-11-26 NOTE — Telephone Encounter (Signed)
Scheduled

## 2015-11-29 ENCOUNTER — Other Ambulatory Visit: Payer: Medicare Other

## 2015-12-02 ENCOUNTER — Other Ambulatory Visit (INDEPENDENT_AMBULATORY_CARE_PROVIDER_SITE_OTHER): Payer: Medicare Other

## 2015-12-02 DIAGNOSIS — E119 Type 2 diabetes mellitus without complications: Secondary | ICD-10-CM

## 2015-12-02 LAB — HEMOGLOBIN A1C: HEMOGLOBIN A1C: 6.4 % (ref 4.6–6.5)

## 2015-12-08 ENCOUNTER — Other Ambulatory Visit: Payer: Self-pay | Admitting: Nurse Practitioner

## 2015-12-14 ENCOUNTER — Other Ambulatory Visit: Payer: Self-pay | Admitting: Family Medicine

## 2016-01-04 ENCOUNTER — Other Ambulatory Visit: Payer: Self-pay | Admitting: Nurse Practitioner

## 2016-01-10 ENCOUNTER — Ambulatory Visit (INDEPENDENT_AMBULATORY_CARE_PROVIDER_SITE_OTHER): Payer: Medicare Other | Admitting: Family Medicine

## 2016-01-10 ENCOUNTER — Encounter: Payer: Self-pay | Admitting: Family Medicine

## 2016-01-10 VITALS — BP 133/74 | HR 88 | Temp 97.5°F | Resp 14 | Wt 164.1 lb

## 2016-01-10 DIAGNOSIS — E119 Type 2 diabetes mellitus without complications: Secondary | ICD-10-CM

## 2016-01-10 DIAGNOSIS — E785 Hyperlipidemia, unspecified: Secondary | ICD-10-CM

## 2016-01-10 DIAGNOSIS — R55 Syncope and collapse: Secondary | ICD-10-CM | POA: Diagnosis not present

## 2016-01-10 NOTE — Assessment & Plan Note (Signed)
No further episodes. Negative workup from Cardiology. Per neurology, felt to have seizure disorder. Doing well on Keppra. Continue.

## 2016-01-10 NOTE — Assessment & Plan Note (Signed)
Currently on Fibrate. Not on statin. Fairly well controlled but would prefer LDL of less than 70. Will consider starting statin.

## 2016-01-10 NOTE — Progress Notes (Signed)
Subjective:  Patient ID: Douglas Mercer, male    DOB: November 09, 1945  Age: 70 y.o. MRN: [id number]  CC: Follow up  HPI:  70 year old male with DM-2, history of syncope, BPH, Hx of DVT/PE (has antiphospholipid syndrome), HLD presents for follow up.  Syncope  Patient previously seen for syncope.  History was suggestive of seizure disorder. Patient was seen by neurology as well as cardiology (given history).  Patient had an abnormal EEG and it was felt that he has partial onset seizures.  He has been placed on Keppra and has been doing well.  He had a negative cardiology workup.  He states that he's doing well on the [place]. He is excited about being able to drive in Dec.  No further episodes.  DM-2  At goal on metformin and glipizide.  HLD  Fairly well controlled (LDL 81).  Is not on statin. Would benefit from statin.  Social Hx   Social History   Social History  . Marital status: Married    Spouse name: Judson Roch  . Number of children: 0  . Years of education: 14   Occupational History  . Physiological scientist) Other   Social History Main Topics  . Smoking status: Never Smoker  . Smokeless tobacco: Never Used  . Alcohol use 0.0 oz/week     Comment: socially  . Drug use: No  . Sexual activity: Not Currently   Other Topics Concern  . 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   Neurological:       No further syncope.   Objective:  BP 133/74 (BP Location: Left Arm, Patient Position: Sitting, Cuff Size: Normal)   Pulse 88   Temp 97.5 F (36.4 C) (Oral)   Resp 14   Wt 164 lb 2 oz (74.4 kg)   SpO2 97%   BMI 23.89 kg/m   BP/Weight 01/10/2016  11/18/2015 [id number]  Systolic BP [id number]  Diastolic BP 74 79 80  Wt. (Lbs) 164.13 165 163.5  BMI 23.89 24.02 23.8    Physical Exam  Constitutional: He is oriented to person, place, and time. He appears well-developed. No distress.  Cardiovascular: Normal rate and regular rhythm.   Pulmonary/Chest: Effort normal and breath sounds normal.  Neurological: He is alert and oriented to person, place, and time.  Psychiatric: He has a normal mood and affect.  Vitals reviewed.   Lab Results  Component Value Date   WBC 4.9 07/14/2015   HGB 15.2 07/14/2015   HCT 45.2 07/14/2015   PLT 212.0 07/14/2015   GLUCOSE 132 (H) 07/14/2015   CHOL 134 07/14/2015   TRIG 112.0 07/14/2015   HDL 30.50 (L) 07/14/2015   LDLDIRECT 85.0 07/14/2015   LDLCALC 81 07/14/2015   ALT 21 07/14/2015   AST 23 07/14/2015   NA 139 07/14/2015   K 4.2 07/14/2015   CL 106 07/14/2015   CREATININE 1.06 07/14/2015   BUN 17 07/14/2015   CO2 29 07/14/2015   TSH 2.90 05/30/2012   PSA 3.56 05/08/2008   INR 2.6 05/24/2011   HGBA1C 6.4 12/02/2015   MICROALBUR 5.1 (H) 04/26/2015  Assessment & Plan:   Problem List Items Addressed This Visit    Type 2 diabetes mellitus without complication, without long-term current use of insulin ([place])    At goal. Continue metformin and glipizide.      Syncope - Primary    No further episodes. Negative workup from Cardiology. Per neurology, felt to have seizure disorder. Doing well on Keppra. Continue.       Hyperlipidemia    Currently on Fibrate. Not on statin. Fairly well controlled but would prefer LDL of less than 70. Will consider starting statin.         Follow-up: Return in about 6 months (around 07/09/2016).  [place]

## 2016-01-10 NOTE — Patient Instructions (Signed)
Continue your meds.  Follow up in 6 months.  Take care  Dr. Viyaan Champine  

## 2016-01-10 NOTE — Assessment & Plan Note (Signed)
At goal. Continue metformin and glipizide.

## 2016-01-11 ENCOUNTER — Telehealth: Payer: Self-pay

## 2016-01-11 ENCOUNTER — Other Ambulatory Visit: Payer: Self-pay | Admitting: Family Medicine

## 2016-01-11 MED ORDER — ATORVASTATIN CALCIUM 10 MG PO TABS: 10.0000 mg | ORAL_TABLET | Freq: Every day | ORAL | 3 refills | Status: DC

## 2016-01-11 NOTE — Telephone Encounter (Signed)
Pt was called and stated that he is okay with additional medication for cholesterol. Please send to [facility].

## 2016-01-11 NOTE — Telephone Encounter (Signed)
-----   Message from Coral Spikes, [place] sent at 01/10/2016  9:05 PM EST ----- I forgot to ask patient if he would consider an additional medicine for his cholesterol (I would like his numbers to be a little better given DM).

## 2016-03-12 ENCOUNTER — Encounter (HOSPITAL_COMMUNITY): Payer: Self-pay | Admitting: Emergency Medicine

## 2016-03-12 ENCOUNTER — Emergency Department (HOSPITAL_COMMUNITY)
Admission: EM | Admit: 2016-03-12 | Discharge: 2016-03-12 | Disposition: A | Discharge status: Home / Self Care | Payer: Medicare Other | Attending: Emergency Medicine | Admitting: Emergency Medicine

## 2016-03-12 DIAGNOSIS — I10 Essential (primary) hypertension: Secondary | ICD-10-CM | POA: Insufficient documentation

## 2016-03-12 DIAGNOSIS — R310 Gross hematuria: Secondary | ICD-10-CM

## 2016-03-12 DIAGNOSIS — Z79899 Other long term (current) drug therapy: Secondary | ICD-10-CM | POA: Insufficient documentation

## 2016-03-12 DIAGNOSIS — Z7984 Long term (current) use of oral hypoglycemic drugs: Secondary | ICD-10-CM | POA: Diagnosis not present

## 2016-03-12 DIAGNOSIS — Z7982 Long term (current) use of aspirin: Secondary | ICD-10-CM | POA: Insufficient documentation

## 2016-03-12 DIAGNOSIS — Z7902 Long term (current) use of antithrombotics/antiplatelets: Secondary | ICD-10-CM | POA: Insufficient documentation

## 2016-03-12 DIAGNOSIS — R319 Hematuria, unspecified: Secondary | ICD-10-CM | POA: Diagnosis present

## 2016-03-12 DIAGNOSIS — E119 Type 2 diabetes mellitus without complications: Secondary | ICD-10-CM | POA: Diagnosis not present

## 2016-03-12 LAB — URINALYSIS, ROUTINE W REFLEX MICROSCOPIC
Bilirubin Urine: NEGATIVE
GLUCOSE, UA: NEGATIVE mg/dL
Ketones, ur: NEGATIVE mg/dL
Leukocytes, UA: NEGATIVE
Nitrite: NEGATIVE
PROTEIN: 100 mg/dL — AB
SPECIFIC GRAVITY, URINE: 1.01 (ref 1.005–1.030)
Squamous Epithelial / LPF: NONE SEEN
WBC UA: NONE SEEN WBC/hpf (ref 0–5)
pH: 6 (ref 5.0–8.0)

## 2016-03-12 LAB — COMPREHENSIVE METABOLIC PANEL
ALBUMIN: 4.4 g/dL (ref 3.5–5.0)
ALK PHOS: 46 U/L (ref 38–126)
ALT: 25 U/L (ref 17–63)
AST: 30 U/L (ref 15–41)
Anion gap: 9 (ref 5–15)
BILIRUBIN TOTAL: 0.8 mg/dL (ref 0.3–1.2)
BUN: 22 mg/dL — AB (ref 6–20)
CALCIUM: 9.4 mg/dL (ref 8.9–10.3)
CO2: 24 mmol/L (ref 22–32)
CREATININE: 1.16 mg/dL (ref 0.61–1.24)
Chloride: 104 mmol/L (ref 101–111)
GFR calc Af Amer: >60 mL/min (ref >60)
GFR calc non Af Amer: >60 mL/min (ref >60)
GLUCOSE: 170 mg/dL — AB (ref 65–99)
Potassium: 4.1 mmol/L (ref 3.5–5.1)
SODIUM: 137 mmol/L (ref 135–145)
TOTAL PROTEIN: 7.7 g/dL (ref 6.5–8.1)

## 2016-03-12 LAB — CBC
HEMATOCRIT: 46.9 % (ref 39.0–52.0)
HEMOGLOBIN: 15.7 g/dL (ref 13.0–17.0)
MCH: 29.8 pg (ref 26.0–34.0)
MCHC: 33.5 g/dL (ref 30.0–36.0)
MCV: 89 fL (ref 78.0–100.0)
Platelets: 222 10*3/uL (ref 150–400)
RBC: 5.27 MIL/uL (ref 4.22–5.81)
RDW: 13.6 % (ref 11.5–15.5)
WBC: 5 10*3/uL (ref 4.0–10.5)

## 2016-03-12 LAB — PROTIME-INR
INR: 1.13
Prothrombin Time: 14.5 seconds (ref 11.4–15.2)

## 2016-03-12 NOTE — Discharge Instructions (Signed)
You do not have a low blood count, you do not have evidence of a urinary tract infection. You have bleeding from ear urinary tract that is likely from your prostate. Please follow-up with your urology team for further management. If symptoms worsen or you develop any infectious symptoms, please return to the nearest emergency department.

## 2016-03-12 NOTE — ED Notes (Signed)
Patient has fluids and is drinking, patient will call out when ready to give urine sample.

## 2016-03-12 NOTE — ED Triage Notes (Signed)
Patient states that he urinated a blood clot yesterday then having bright blood in urine today.  Patient is on Xeralto and ASA.  Patient had bleeding once a week since he starting seeing Urologist in December.  Patient had TURP before.

## 2016-03-12 NOTE — ED Provider Notes (Signed)
[facility] DEPT Provider Note   CSN: [id number] Arrival date & time: 03/12/16  [id number]     History   Chief Complaint Chief Complaint  Patient presents with  . Hematuria    HPI Douglas Mercer is a 71 y.o. male With the past medical history significant for diabetes, BPH status post TURP, hyperlipidemia, and recurrent DVT on Xarelto and aspirin who presents with hematuria. According to patient, hematuria has been a intermittent problem for him for the last few months. Patient says that he had a TURP performed by urology And has had intermittent hematuria ever since. He reports that he was evaluated several weeks ago by his urology team and a rectal exam was performed. He reports that his prostate was palpated  During this visit and since then, he has had persistent hematuria. He says that he was instructed to come to ED if he ever had several days of constant hematuria. He says that the last four days he has had bleeding prompting him to seek evaluation.  Patient denies any fevers, chills, chest pain, shortness of breath, nausea, vomiting, abdominal pain, penis pain, or anything with bowel movements. He denies any bleeding and other locations. He says that he is still able to urinate however, it is usually very bloody with clots. He denies any other complaints including dysuria.  HPI  Past Medical History:  Diagnosis Date  . Allergic rhinitis   . Anti-phospholipid antibody syndrome (HCC)   . BPH (benign prostatic hypertrophy)   . Diabetes mellitus    type 2  . DVT (deep venous thrombosis) ([place]) 02/2009   large left lower  . ED (erectile dysfunction)   . GERD (gastroesophageal reflux disease)   . Glaucoma   . Hyperlipidemia   . Hypertension   . Internal hemorrhoids   . Pulmonary embolism ([place]) 1992   bilateral w/ PE reportedly (+) lupus anticoagulant  . Rosacea   . Tubular adenoma of colon     Patient Active Problem List   Diagnosis Date Noted  . Simple partial onset  seizures followed by impaired consciousness ([place]) 11/19/2015  . Syncope 08/17/2015  . Type 2 diabetes mellitus without complication, without long-term current use of insulin ([place]) 07/14/2015  . Anti-phospholipid antibody syndrome (HCC)   . Personal history of colonic polyps - adenomas 05/12/2011  . Hyperlipidemia 03/08/2006  . Rosacea 03/07/2006  . BPH (benign prostatic hyperplasia) 03/07/2006    Past Surgical History:  Procedure Laterality Date  . COLONOSCOPY  05/07/2006  . colonoscopy with polypectomy  07/07/2002   2 small descending colon polyps  . colonoscopy with polypectomy  04/19/2001   26mm sessile mucosal polyp in distal sigmoid colon  . diverticulosis  07/07/2002,04/19/2001   mild sigmoid  . ESOPHAGOGASTRODUODENOSCOPY    . [place] /2011   right-sided, mesh placed  . PROSTATE SURGERY     18 biopsies.  Benign.  . TONSILLECTOMY    . TRANSURETHRAL RESECTION OF PROSTATE  08/13/09       Home Medications    Prior to Admission medications   Medication Sig Start Date End Date Taking? Authorizing Provider  aspirin 81 MG tablet Take 81 mg by mouth daily.    Historical Provider, MD  atorvastatin (LIPITOR) 10 MG tablet Take 1 tablet (10 mg total) by mouth daily. 01/11/16   Coral Spikes, DO  co-enzyme Q-10 30 MG capsule Take 100 mg by mouth every morning.     Historical Provider, MD  fenofibrate 160 MG tablet TAKE 1  TABLET(160 MG) BY MOUTH DAILY 01/04/16   Coral Spikes, DO  fish oil-omega-3 fatty acids 1000 MG capsule Take 2 g by mouth daily.      Historical Provider, MD  fluticasone (FLONASE) 50 MCG/ACT nasal spray Place 2 sprays into the nose daily. Patient taking differently: Place 2 sprays into the nose daily as needed.  02/05/12   Colon Branch, MD  glipiZIDE (GLUCOTROL XL) 5 MG 24 hr tablet TAKE 1 TABLET(5 MG) BY MOUTH DAILY WITH BREAKFAST 11/15/15   Coral Spikes, DO  levETIRAcetam (KEPPRA) 750 MG tablet Take 1 tablet (750 mg total) by mouth 2 (two) times daily.  08/19/15   Melvenia Beam, MD  LUMIGAN 0.01 % SOLN INSTILL 1 DROP IN BOTH EYES AT BEDTIME 05/27/15   Historical Provider, MD  metFORMIN (GLUCOPHAGE) 850 MG tablet TAKE 1 TABLET BY MOUTH TWICE DAILY WITH A MEAL 12/08/15   Coral Spikes, DO  Multiple Vitamin (MULTIVITAMIN) capsule Diabetic health Pack 6-7 pills a day    Historical Provider, MD  pantoprazole (PROTONIX) 40 MG tablet TAKE 1 TABLET(40 MG) BY MOUTH DAILY BEFORE BREAKFAST 12/14/15   Coral Spikes, DO  rivaroxaban (XARELTO) 20 MG TABS tablet Take 1 tablet (20 mg total) by mouth daily at 12 noon. 09/03/15   Coral Spikes, DO  tetracycline (ACHROMYCIN,SUMYCIN) 500 MG capsule TK 1 C PO BID 05/24/15   Historical Provider, MD    Family History Family History  Problem Relation Age of Onset  . Diabetes Mother   . Melanoma Mother   . COPD Mother   . COPD Father   . Heart attack Maternal Uncle   . Stroke Maternal Aunt     grandparents  . Colon cancer Paternal Uncle     x2  . Prostate cancer Neg Hx   . Seizures Neg Hx     Social History Social History  Substance Use Topics  . Smoking status: Never Smoker  . Smokeless tobacco: Never Used  . Alcohol use 0.0 oz/week     Comment: socially     Allergies   Patient has no known allergies.   Review of Systems Review of Systems  Constitutional: Negative for activity change, chills, diaphoresis, fatigue and fever.  HENT: Negative for congestion and rhinorrhea.   Eyes: Negative for visual disturbance.  Respiratory: Negative for cough, chest tightness, shortness of breath, wheezing and stridor.   Cardiovascular: Negative for chest pain, palpitations and leg swelling.  Gastrointestinal: Negative for abdominal distention, abdominal pain, blood in stool, constipation, diarrhea, nausea and vomiting.  Genitourinary: Positive for hematuria. Negative for difficulty urinating, dysuria, flank pain, frequency, genital sores, penile pain, penile swelling, scrotal swelling, testicular pain and urgency.    Musculoskeletal: Negative for back pain and gait problem.  Skin: Negative for rash and wound.  Neurological: Negative for dizziness, weakness, light-headedness and headaches.  Psychiatric/Behavioral: Negative for agitation.  All other systems reviewed and are negative.    Physical Exam Updated Vital Signs BP 150/83 (BP Location: Left Arm)   Pulse 103   Temp 97.4 F (36.3 C) (Oral)   Resp 16   SpO2 99%   Physical Exam  Constitutional: He appears well-developed and well-nourished.  HENT:  Head: Normocephalic and atraumatic.  Mouth/Throat: No oropharyngeal exudate.  Eyes: Conjunctivae and EOM are normal. Pupils are equal, round, and reactive to light. No scleral icterus.  Neck: Neck supple. No tracheal deviation present.  Cardiovascular: Normal rate, regular rhythm and intact distal pulses.   No murmur  heard. Pulmonary/Chest: Effort normal and breath sounds normal. No respiratory distress. He exhibits no tenderness.  Abdominal: Soft. He exhibits no distension. There is no tenderness.  Genitourinary: Penis normal. No penile tenderness.  Musculoskeletal: He exhibits no edema or tenderness.  Neurological: He is alert.  Skin: Skin is warm and dry. Capillary refill takes less than 2 seconds. No pallor.  Psychiatric: He has a normal mood and affect.  Nursing note and vitals reviewed.    ED Treatments / Results  Labs (all labs ordered are listed, but only abnormal results are displayed) Labs Reviewed  URINALYSIS, ROUTINE W REFLEX MICROSCOPIC - Abnormal; Notable for the following:       Result Value   Color, Urine RED (*)    APPearance HAZY (*)    Hgb urine dipstick LARGE (*)    Protein, ur 100 (*)    Bacteria, UA FEW (*)    All other components within normal limits  COMPREHENSIVE METABOLIC PANEL - Abnormal; Notable for the following:    Glucose, Bld 170 (*)    BUN 22 (*)    All other components within normal limits  URINE CULTURE  CBC  PROTIME-INR    EKG  EKG  Interpretation None       Radiology No results found.  Procedures Procedures (including critical care time)  Medications Ordered in ED Medications - No data to display   Initial Impression / Assessment and Plan / ED Course  I have reviewed the triage vital signs and the nursing notes.  Pertinent labs & imaging results that were available during my care of the patient were reviewed by me and considered in my medical decision making (see chart for details).     Chadron Duel is a 71 y.o. male With the past medical history significant for diabetes, BPH status post TURP, hyperlipidemia, and recurrent DVT on Xarelto and aspirin who presents with hematuria.  History and exam are seen above. Patient has no abdominal tenderness. Patient has no penile lesions or gross blood in the meatus. Normal GU exam. Lungs clear. Exam otherwise unremarkable.  Given hematuria, suspect this is from manipulation of his prostate on recent exam and his current anticoagulation status. Patient has no symptoms of anemia however, given bleeding for several days, you will have lab testing to look for coagulation problems, anemia, or UTI exacerbating his hematuria.  Urinalysis showed hemoglobin but did not show evidence of UTI. No evidence of anemia.  Given patient's lack of anemia or infection, feel patient is stable for discharge home to follow up with his urology team. Patient and family agree with urology follow. Do not feel patient needs emergent urology evaluation at this time as he is still able to urinate, has no abdominal pain, has similar kidney function to prior, it is no evidence of infection. Family understood return precautions for any new or worsening symptoms including that of anemia.  Patient and family had no other questions or concerns and patient was discharged in good condition.    Final Clinical Impressions(s) / ED Diagnoses   Final diagnoses:  Gross hematuria    New  Prescriptions Discharge Medication List as of 03/12/2016  2:52 PM      Clinical Impression: 1. Gross hematuria     Disposition: Discharge  Condition: Good  I have discussed the results, Dx and Tx plan with the pt(& family if present). He/she/they expressed understanding and agree(s) with the plan. Discharge instructions discussed at great length. Strict return precautions discussed and pt &/or  family have verbalized understanding of the instructions. No further questions at time of discharge.    Discharge Medication List as of 03/12/2016  2:52 PM      Follow Up: Coral Spikes, DO [address] [place] [address] [phone]     [facility] DEPT [place] [id number] [place] [phone]  If symptoms worsen      Courtney Paris, MD 03/13/16 1143

## 2016-03-12 NOTE — ED Notes (Signed)
Patient on 2nd jug of water. Patients family also asking for RN to draw lab work.

## 2016-03-13 LAB — URINE CULTURE

## 2016-03-14 ENCOUNTER — Other Ambulatory Visit: Payer: Self-pay | Admitting: Family Medicine

## 2016-03-21 ENCOUNTER — Other Ambulatory Visit: Payer: Self-pay | Admitting: Family Medicine

## 2016-05-17 ENCOUNTER — Other Ambulatory Visit: Payer: Self-pay | Admitting: Family Medicine

## 2016-06-12 ENCOUNTER — Ambulatory Visit (INDEPENDENT_AMBULATORY_CARE_PROVIDER_SITE_OTHER): Payer: Medicare Other | Admitting: Neurology

## 2016-06-12 ENCOUNTER — Encounter: Payer: Self-pay | Admitting: Neurology

## 2016-06-12 ENCOUNTER — Other Ambulatory Visit: Payer: Self-pay | Admitting: Family Medicine

## 2016-06-12 VITALS — BP 157/85 | HR 92 | Ht 69.5 in | Wt 162.8 lb

## 2016-06-12 DIAGNOSIS — G40009 Localization-related (focal) (partial) idiopathic epilepsy and epileptic syndromes with seizures of localized onset, not intractable, without status epilepticus: Secondary | ICD-10-CM

## 2016-06-12 MED ORDER — LEVETIRACETAM 750 MG PO TABS: 750.0000 mg | ORAL_TABLET | Freq: Two times a day (BID) | ORAL | 4 refills | Status: DC

## 2016-06-12 NOTE — Patient Instructions (Signed)
Remember to drink plenty of fluid, eat healthy meals and do not skip any meals. Try to eat protein with a every meal and eat a healthy snack such as fruit or nuts in between meals. Try to keep a regular sleep-wake schedule and try to exercise daily, particularly in the form of walking, 20-30 minutes a day, if you can.   As far as your medications are concerned, I would like to suggest: Continue [place] phone number is [phone]. We also have an after hours call service for urgent matters and there is a physician on-call for urgent questions. For any emergencies you know to call 911 or go to the nearest emergency room

## 2016-06-12 NOTE — Progress Notes (Signed)
[facility]    Provider:  Dr Douglas Mercer Referring Provider: Coral Spikes, DO Primary Care Physician:  Douglas Spikes, DO  CC: Syncopal episode  Interval history: Douglas well. No seizures. No side effects to the medication. He is driving now again. No side effects from the medication. We discussed Prone patient's situation and he should likely stay on Keppra for a minimum of several years if not indefinitely. Also discussed seizure precautions.  Interval history: Patient is doing well. Wife is driving. He can drive in December if no events. No events. No side effects of the medication. Discussed the EEG below. Discussed seizures. Patient doing well. Discussed EEG. Discussed Keppra and seizure management.   CONCLUSION: This is a mild abnormal awake and asleep EEG. There is no electrodiagnostic evidence of epileptiform discharge, there is evidence of intermittent left frontal slowing, suggestive of focal irritability.  IDP:OEUMP [name] a 71 y.o.malehere as a referral from Dr. Revonda Mercer syncopal episode. Past medical history diabetes and high cholesterol. He has had 2 episodes, first one on mother's day he had an episode, second event this past Sunday when he fell backwards and hit his head on he concrete floor. Per patient he does not remember anything, symptoms and all of a sudden he woke up on the floor. Wife is here and provides information: Both events happened at church. Per his wife, his left side started jerking, his eyes rolled back, he didn't respond for 30 seconds, she called the EMS on the first episode. No one saw the second episode start but found him slumped over. The second time, his eyes were rolled into his head, scared her to death, looked dead, could see the whites of his eyes. He had a tonic posture. Confusion afterwards. There were 2 nurses in attendance who both said looked like a seizure, duration less than a minute, he had hit his head on the concrete  floor, no fracture. He was hospitalized at [facility]. No urination, no tongue biting. No previous history of seizures, he sometimes has uncontrollable shrugs and jerks. No personal history or family history of seizures. He has a history of meningitis. No inciting events or head trauma. No known triggering events. Nothing makes the symptoms worse or better. No other associated symptoms. No other focal neurologic deficits or complaints per patient and wife.  Reviewed notes, labs and imaging from outside physicians, which showed:  Patient has antiphospholipid syndrome, BPH, hyperlipidemia, diabetes and was seen in outpatient for history of syncope. Patient stated that on Mother's Day he was at church and became unresponsive, he has some chronic activity of his left arm and became unresponsive, lasting briefly, he was evaluated by EMS and was not taken in for evaluation. On 625 he was at church, he says he was feeling fine, no prior palpitations or chest pain, he fell directly back hitting the back of his head, his eyes rolled back and he was unresponsive for approximately 2 minutes, no tongue injury incontinence, postictal confusion for a few minutes. He was transported to the local hospital and was evaluated. Review of records shows no other syncopal episodes reported in the past.  Ultrasonic carotid arteries with color Doppler showed mild right carotid bulb plaque. Bilateral stenosis of less than 50%. Date 08/16/2015.  MRI of the brain without contrast showed no acute intracranial pathology. No extra-axial fluid collection. No major territorial vascular infarctions. Ventricles, sulci and other CSF spaces demonstrates moderate cerebral and cerebellar parenchymal volume loss. Mild scattered. Ventricular and subcortical  white matter T2 FLAIR hyperintensities are nonspecific findings likely related to sequelae of chronic small vessel ischemic changes. No evidence of mass, mass effect or midline shift.  Fourth ventricle is midline. Major intracranial vascular flow voids appear preserved. Orbits are grossly intact. Performed 08/16/2015.  Echocardiogram showed grossly normal left ventricular size and systolic function, ejection fraction 50-60%. Impaired relaxation of diastolic filling pattern, trace amount of aortic regurgitation, trace mitral regurgitation, right ventricular systolic pressure as measured by Dopplers 21.54 mmHg.  Personally reviewed EKG tracing which showed normal sinus rhythm, normal axis, no ST or T-wave changes, first degree AV block.  CBC normal, CMP unremarkable with normal creatinine 1.06, elevated glucose 132.  Review of Systems: Patient complains of symptoms per HPI as well as the following symptoms: Snoring, hearing loss, urination problems, blood in urine, passing out, snoring. Pertinent negatives per HPI. All others negative.    Social History   Social History  . Marital status: Married    Spouse name: Douglas Mercer  . Number of children: 0  . Years of education: 14   Occupational History  . Physiological scientist) Other   Social History Main Topics  . Smoking status: Never Smoker  . Smokeless tobacco: Never Used  . Alcohol use 0.0 oz/week     Comment: socially  . Drug use: No  . Sexual activity: Not Currently   Other Topics Concern  . 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 Diabetes Mother   . Melanoma Mother   . COPD Mother   . COPD Father   . Heart attack Maternal Uncle   . Stroke Maternal Aunt     grandparents  . Colon cancer Paternal Uncle     x2  . Prostate cancer Neg Hx   . Seizures Neg Hx     Past Medical History:    Diagnosis Date  . Allergic rhinitis   . Anti-phospholipid antibody syndrome (HCC)   . BPH (benign prostatic hypertrophy)   . Diabetes mellitus    type 2  . DVT (deep venous thrombosis) ([place]) 02/2009   large left lower  . ED (erectile dysfunction)   . GERD (gastroesophageal reflux disease)   . Glaucoma   . Hematuria   . Hyperlipidemia   . Hypertension   . Internal hemorrhoids   . Pulmonary embolism ([place]) 1992   bilateral w/ PE reportedly (+) lupus anticoagulant  . Rosacea   . Tubular adenoma of colon     Past Surgical History:  Procedure Laterality Date  . COLONOSCOPY  05/07/2006  . colonoscopy with polypectomy  07/07/2002   2 small descending colon polyps  . colonoscopy with polypectomy  04/19/2001   64mm sessile mucosal polyp in distal sigmoid colon  . diverticulosis  07/07/2002,04/19/2001   mild sigmoid  . ESOPHAGOGASTRODUODENOSCOPY    . [place] /2011   right-sided, mesh placed  . PROSTATE SURGERY     18 biopsies.  Benign.  . TONSILLECTOMY    . TRANSURETHRAL RESECTION OF PROSTATE  08/13/09    Current Outpatient Prescriptions  Medication Sig Dispense Refill  .  aspirin 81 MG tablet Take 81 mg by mouth daily.    Marland Kitchen atorvastatin (LIPITOR) 10 MG tablet Take 1 tablet (10 mg total) by mouth daily. 90 tablet 3  . co-enzyme Q-10 30 MG capsule Take 100 mg by mouth every morning.     . fenofibrate 160 MG tablet TAKE 1 TABLET(160 MG) BY MOUTH DAILY 90 tablet 1  . fish oil-omega-3 fatty acids 1000 MG capsule Take 2 g by mouth daily.      . fluticasone (FLONASE) 50 MCG/ACT nasal spray Place 2 sprays into the nose daily. (Patient taking differently: Place 2 sprays into the nose daily as needed for allergies or rhinitis. ) 48 g 0  . glipiZIDE (GLUCOTROL XL) 5 MG 24 hr tablet TAKE 1 TABLET(5 MG) BY MOUTH DAILY WITH BREAKFAST 90 tablet 1  . latanoprost (XALATAN) 0.005 % ophthalmic solution INT 1 GTT INTO OU HS  2  . levETIRAcetam (KEPPRA) 750 MG tablet Take 1 tablet  (750 mg total) by mouth 2 (two) times daily. 60 tablet 11  . metFORMIN (GLUCOPHAGE) 850 MG tablet TAKE 1 TABLET BY MOUTH TWICE DAILY WITH A MEAL 180 tablet 0  . Multiple Vitamin (MULTIVITAMIN) capsule Diabetic health Pack 6-7 pills a day    . pantoprazole (PROTONIX) 40 MG tablet TAKE 1 TABLET(40 MG) BY MOUTH DAILY BEFORE BREAKFAST 90 tablet 1  . Propylene Glycol (SYSTANE BALANCE) 0.6 % SOLN Place 1 drop into both eyes 2 (two) times daily.    . rivaroxaban (XARELTO) 20 MG TABS tablet Take 1 tablet (20 mg total) by mouth daily at 12 noon. 90 tablet 3  . tetracycline (ACHROMYCIN,SUMYCIN) 500 MG capsule Take 1 capsule by mouth weekly as needed for roscasea  4   No current facility-administered medications for this visit.     Allergies as of 06/12/2016  . (No Known Allergies)    Vitals: BP (!) 157/85   Pulse 92   Ht 5' 9.5" (1.765 m)   Wt 162 lb 12.8 oz (73.8 kg)   BMI 23.70 kg/m  Last Weight:  Wt Readings from Last 1 Encounters:  06/12/16 162 lb 12.8 oz (73.8 kg)   Last Height:   Ht Readings from Last 1 Encounters:  06/12/16 5' 9.5" (1.765 m)     Physical exam: Exam: Gen: NAD, conversant, well nourised, well groomed  CV: RRR, no MRG. No Carotid Bruits. No peripheral edema, warm, nontender Eyes: Conjunctivae clear without exudates or hemorrhage  Neuro: Detailed Neurologic Exam  Speech: Speech is normal; fluent and spontaneous with normal comprehension.  Cognition: The patient is oriented to person, place, and time;  recent and remote memory intact;  language fluent;  normal attention, concentration,  fund of knowledge Cranial Nerves: The pupils are equal, round, and reactive to light. Attempted funduscopic exam could not visualize due to small pupils. Visual fields are full to finger confrontation. Extraocular movements are intact. Trigeminal sensation is intact and the muscles of mastication are normal. The face is symmetric. The  palate elevates in the midline. Hearing intact. Voice is normal. Shoulder shrug is normal. The tongue has normal motion without fasciculations.   Coordination: Normal finger to nose and heel to shin. Normal rapid alternating movements.   Gait: Heel-toe and tandem gait are normal.   Motor Observation: No asymmetry, no atrophy, and no involuntary movements noted. Tone: Normal muscle tone.   Posture: Posture is normal. normal erect  Strength: Strength is V/V in the upper and lower limbs.   Sensation: intact to LT  Reflex Exam:  DTR's: Deep tendon reflexes in the upper and lower extremities are normal bilaterally.  Toes: The toes are downgoing bilaterally.  Clonus: Clonus is absent.    Assessment/Plan:71 year old male with likely partial onset seizures, seizures started with left arm jerking, eyes rolled back, patient loses consciousness with tonic posturing, postictal confusion. Doing well on Keppra 750mg  twice daily, advise to stay on it for life.   We'll continue Keppra 750 mg twice daily.   Routine EEG was abnormal  Patient is unable to drive, operate heavy machinery, perform activities at heights or participate in water activities until 6 months seizure free. He has been 6 months since December 2017.  Discussed Patients with epilepsy have a small risk of sudden unexpected death, a condition referred to as sudden unexpected death in epilepsy (SUDEP). SUDEP is defined specifically as the sudden, unexpected, witnessed or unwitnessed, nontraumatic and nondrowning death in patients with epilepsy with or without evidence for a seizure, and excluding documented status epilepticus, in which post mortem examination does not reveal a structural or toxicologic cause for death   F/u with pcp who can refill medication.  Call if there are any other events so we can adjust medication and see him back in clinic.  Sarina Ill,  MD  [facility] [address] [place], [place] [address]  Phone [phone] Fax [phone]  A total of 15 minutes was spent face-to-face with this patient. Over half this time was spent on counseling patient on the seizure diagnosis and different diagnostic and therapeutic options available.

## 2016-06-22 ENCOUNTER — Ambulatory Visit: Payer: Medicare Other

## 2016-07-04 ENCOUNTER — Other Ambulatory Visit: Payer: Self-pay | Admitting: Family Medicine

## 2016-07-11 ENCOUNTER — Ambulatory Visit (INDEPENDENT_AMBULATORY_CARE_PROVIDER_SITE_OTHER): Payer: Medicare Other | Admitting: Family Medicine

## 2016-07-11 ENCOUNTER — Encounter: Payer: Self-pay | Admitting: Family Medicine

## 2016-07-11 VITALS — BP 128/74 | HR 79 | Temp 97.6°F | Wt 163.2 lb

## 2016-07-11 DIAGNOSIS — E119 Type 2 diabetes mellitus without complications: Secondary | ICD-10-CM | POA: Diagnosis not present

## 2016-07-11 DIAGNOSIS — E785 Hyperlipidemia, unspecified: Secondary | ICD-10-CM | POA: Diagnosis not present

## 2016-07-11 DIAGNOSIS — D6861 Antiphospholipid syndrome: Secondary | ICD-10-CM

## 2016-07-11 DIAGNOSIS — G40109 Localization-related (focal) (partial) symptomatic epilepsy and epileptic syndromes with simple partial seizures, not intractable, without status epilepticus: Secondary | ICD-10-CM

## 2016-07-11 DIAGNOSIS — I10 Essential (primary) hypertension: Secondary | ICD-10-CM | POA: Insufficient documentation

## 2016-07-11 DIAGNOSIS — N401 Enlarged prostate with lower urinary tract symptoms: Secondary | ICD-10-CM

## 2016-07-11 DIAGNOSIS — G40209 Localization-related (focal) (partial) symptomatic epilepsy and epileptic syndromes with complex partial seizures, not intractable, without status epilepticus: Secondary | ICD-10-CM

## 2016-07-11 LAB — POCT GLYCOSYLATED HEMOGLOBIN (HGB A1C): Hemoglobin A1C: 6.6

## 2016-07-11 LAB — MICROALBUMIN / CREATININE URINE RATIO
Creatinine,U: 116.9 mg/dL
MICROALB/CREAT RATIO: 3.7 mg/g (ref 0.0–30.0)
Microalb, Ur: 4.3 mg/dL — ABNORMAL HIGH (ref 0.0–1.9)

## 2016-07-11 NOTE — Patient Instructions (Addendum)
You're doing well. Everything is a at goal.   Continue your meds.  Follow up in 6 months.  Take care  Dr. Lacinda Axon

## 2016-07-11 NOTE — Assessment & Plan Note (Signed)
Doing well on xarelto and aspirin.

## 2016-07-11 NOTE — Assessment & Plan Note (Signed)
At goal. A1c 6.6 today. Continue metformin and glipizide.

## 2016-07-11 NOTE — Assessment & Plan Note (Signed)
Has been stable. Planning to repeat lipid panel at next visit.

## 2016-07-11 NOTE — Assessment & Plan Note (Signed)
Has been seizure-free since starting Keppra. Doing well. Continue follow-up with neurology.

## 2016-07-11 NOTE — Assessment & Plan Note (Signed)
Recent hematuria. Follows with urology. They have added finasteride. Patient has no complaints this time.

## 2016-07-11 NOTE — Progress Notes (Signed)
Subjective:  Patient ID: Douglas Mercer, male    DOB: Jun 29, 1945  Age: 71 y.o. MRN: [id number]  CC: Follow up  HPI:  71 year old male with HTN, HLD, DM-2, seizure disorder, antiphospholipid antibody syndrome presents for follow up.  Hypertension  Stable. Not currently on any medication.  In need of urine microalbumin today.  DM 2  At goal on metformin and glipizide.  Hyperlipidemia  Has been stable.  He is currently on Fibrate and lipitor.  Seizure disorder  Seizure free since starting Keppra.  Followed by neurology.  Doing well at this time.  Antiphospholipid antibody syndrome  No events.  Doing well on aspirin and xarelto.  Social Hx   Social History   Social History  . Marital status: Married    Spouse name: Douglas Mercer  . Number of children: 0  . Years of education: 14   Occupational History  . Physiological scientist) Other   Social History Main Topics  . Smoking status: Never Smoker  . Smokeless tobacco: Never Used  . Alcohol use 0.0 oz/week     Comment: socially  . Drug use: No  . Sexual activity: Not Currently   Other Topics Concern  . 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   Genitourinary:       Recent hematuria.  Neurological: Negative for seizures.   Objective:  BP 128/74 (BP Location: Left Arm, Patient Position: Sitting, Cuff Size: Normal)   Pulse 79   Temp 97.6 F (36.4 C) (Oral)   Wt 163 lb 4 oz (74 kg)   SpO2 99%   BMI 23.76 kg/m   BP/Weight 07/11/2016 06/12/2016 0/81/4481  Systolic BP 856 314 970  Diastolic BP 74 85 79  Wt. (Lbs) 163.25 162.8 -  BMI 23.76 23.7 -   Physical Exam  Constitutional: He is oriented to  person, place, and time. He appears well-developed. No distress.  Cardiovascular: Normal rate and regular rhythm.   Pulmonary/Chest: Effort normal and breath sounds normal. He has no wheezes. He has no rales.  Neurological: He is alert and oriented to person, place, and time.  Psychiatric: He has a normal mood and affect.  Vitals reviewed.  Lab Results  Component Value Date   WBC 5.0 03/12/2016   HGB 15.7 03/12/2016   HCT 46.9 03/12/2016   PLT 222 03/12/2016   GLUCOSE 170 (H) 03/12/2016   CHOL 134 07/14/2015   TRIG 112.0 07/14/2015   HDL 30.50 (L) 07/14/2015   LDLDIRECT 85.0 07/14/2015   LDLCALC 81 07/14/2015   ALT 25 03/12/2016   AST 30 03/12/2016   NA 137 03/12/2016   K 4.1 03/12/2016   CL 104 03/12/2016   CREATININE 1.16 03/12/2016   BUN 22 (H) 03/12/2016   CO2 24 03/12/2016   TSH 2.90 05/30/2012   PSA 3.56 05/08/2008   INR 1.13 03/12/2016   HGBA1C 6.6 07/11/2016   MICROALBUR 5.1 (H) 04/26/2015    Assessment & Plan:   Problem List Items Addressed This Visit    Type 2 diabetes mellitus without complication, without long-term current use of insulin ([place])    At goal.  A1c 6.6 today. Continue metformin and glipizide.      Relevant Orders   POCT glycosylated hemoglobin (Hb A1C) (Completed)   Microalbumin / creatinine urine ratio   Simple partial onset seizures followed by impaired consciousness ([place])    Has been seizure-free since starting Keppra. Doing well. Continue follow-up with neurology.      Hypertension - Primary    BP at goal. Will continue to monitor. Urine microalbumin today.      Relevant Orders   Microalbumin / creatinine urine ratio   Hyperlipidemia    Has been stable. Planning to repeat lipid panel at next visit.      BPH (benign prostatic hyperplasia)    Recent hematuria. Follows with urology. They have added finasteride. Patient has no complaints this time.      Relevant Medications   finasteride (PROSCAR) 5 MG tablet    Anti-phospholipid antibody syndrome (HCC)    Doing well on xarelto and aspirin.        Meds ordered this encounter  Medications  . finasteride (PROSCAR) 5 MG tablet    Sig: Take 5 mg by mouth daily.    Follow-up: 6 months  [place] DO [facility]

## 2016-07-11 NOTE — Assessment & Plan Note (Signed)
BP at goal. Will continue to monitor. Urine microalbumin today.

## 2016-09-07 ENCOUNTER — Encounter: Payer: Self-pay | Admitting: Internal Medicine

## 2016-09-17 ENCOUNTER — Other Ambulatory Visit: Payer: Self-pay | Admitting: Family Medicine

## 2016-09-23 ENCOUNTER — Other Ambulatory Visit: Payer: Self-pay | Admitting: Family Medicine

## 2016-10-03 ENCOUNTER — Other Ambulatory Visit: Payer: Self-pay | Admitting: Family Medicine

## 2016-11-06 ENCOUNTER — Ambulatory Visit (INDEPENDENT_AMBULATORY_CARE_PROVIDER_SITE_OTHER): Payer: Medicare Other | Admitting: Internal Medicine

## 2016-11-06 ENCOUNTER — Encounter: Payer: Self-pay | Admitting: Internal Medicine

## 2016-11-06 ENCOUNTER — Telehealth: Payer: Self-pay

## 2016-11-06 VITALS — BP 130/74 | HR 84 | Ht 68.0 in | Wt 161.2 lb

## 2016-11-06 DIAGNOSIS — Z7901 Long term (current) use of anticoagulants: Secondary | ICD-10-CM

## 2016-11-06 DIAGNOSIS — D6861 Antiphospholipid syndrome: Secondary | ICD-10-CM | POA: Diagnosis not present

## 2016-11-06 DIAGNOSIS — Z86010 Personal history of colonic polyps: Secondary | ICD-10-CM

## 2016-11-06 NOTE — Progress Notes (Signed)
Yassin Bunner 71 y.o. [birth year] [id number]  Assessment & Plan:   Encounter Diagnoses  Name Primary?  Marland Kitchen Hx of adenomatous colonic polyps Yes  . Long term current use of anticoagulant therapy - xarelto   . Anti-phospholipid antibody syndrome (HCC)     Surveillance colonoscopy to be scheduled  Will hold Xarelto 1  days prior to endoscopic procedures - will instruct when and how to resume after procedure. Benefits and risks of procedure explained including risks of bleeding, perforation, infection, missed lesions, reactions to medications and possible need for hospitalization and surgery for complications. Additional rare but real risk of stroke or other vascular clotting events off Xarelto also explained and need to seek urgent help if any signs of these problems occur. Will communicate by phone or EMR with patient's  prescribing provider to confirm that holding Xarelto is reasonable in this case.   I appreciate the opportunity to care for this patient. CC: Coral Spikes, DO   Subjective:   Chief Complaint: History of colon polyps surveillance colonoscopy due  HPI Very nice 71 year old white man, recently retired from Tour manager job, with history of colon polyps throughout the years. Last colonoscopy diminutive adenoma 2013. He takes Xarelto because of a history of DVT and pulmonary emboli due to antiphospholipid antibody syndrome. He developed a clot in 2012 when he was on warfarin and was switched to Xarelto. About to move the [facility] and wants to delay scheduling of colonoscopy a bit. No active GI symptoms. Last seen by me in 2014 due to cough. No Known Allergies Current Meds  Medication Sig  . aspirin 81 MG tablet Take 81 mg by mouth daily.  Marland Kitchen atorvastatin (LIPITOR) 10 MG tablet Take 1 tablet (10 mg total) by mouth daily.  Marland Kitchen co-enzyme Q-10 30 MG capsule Take 100 mg by mouth every morning.   . fenofibrate 160 MG tablet TAKE 1 TABLET(160 MG) BY MOUTH  DAILY  . finasteride (PROSCAR) 5 MG tablet Take 5 mg by mouth daily.   . fish oil-omega-3 fatty acids 1000 MG capsule Take 2 g by mouth daily.    Marland Kitchen glipiZIDE (GLUCOTROL XL) 5 MG 24 hr tablet TAKE 1 TABLET(5 MG) BY MOUTH DAILY WITH BREAKFAST  . latanoprost (XALATAN) 0.005 % ophthalmic solution INT 1 GTT INTO OU HS  . levETIRAcetam (KEPPRA) 750 MG tablet Take 1 tablet (750 mg total) by mouth 2 (two) times daily.  . metFORMIN (GLUCOPHAGE) 850 MG tablet TAKE 1 TABLET BY MOUTH TWICE DAILY WITH A MEAL  . Multiple Vitamin (MULTIVITAMIN) capsule Diabetic health Pack 6-7 pills a day  . pantoprazole (PROTONIX) 40 MG tablet TAKE 1 TABLET(40 MG) BY MOUTH DAILY BEFORE BREAKFAST  . Propylene Glycol (SYSTANE BALANCE) 0.6 % SOLN Place 1 drop into both eyes 2 (two) times daily.  Alveda Reasons 20 MG TABS tablet TAKE 1 TABLET BY MOUTH DAILY AT 12:00 NOON   Past Medical History:  Diagnosis Date  . Allergic rhinitis   . Anti-phospholipid antibody syndrome (HCC)   . BPH (benign prostatic hypertrophy)   . Diabetes mellitus    type 2  . DVT (deep venous thrombosis) ([place]) 02/2009   large left lower  . ED (erectile dysfunction)   . GERD (gastroesophageal reflux disease)   . Glaucoma   . Hyperlipidemia   . Hypertension   . Internal hemorrhoids   . Pulmonary embolism ([place]) 1992   bilateral w/ PE reportedly (+) lupus anticoagulant  . Rosacea   . Tubular adenoma of colon   .  Type 2 diabetes mellitus without complication, without long-term current use of insulin ([place]) 07/14/2015   Past Surgical History:  Procedure Laterality Date  . COLONOSCOPY  05/07/2006  . colonoscopy with polypectomy  07/07/2002   2 small descending colon polyps  . colonoscopy with polypectomy  04/19/2001   57mm sessile mucosal polyp in distal sigmoid colon  . diverticulosis  07/07/2002,04/19/2001   mild sigmoid  . ESOPHAGOGASTRODUODENOSCOPY    . [place] /2011   right-sided, mesh placed  . PROSTATE SURGERY     18  biopsies.  Benign.  . TONSILLECTOMY    . TRANSURETHRAL RESECTION OF PROSTATE  08/13/09   Social History   Social History  . Marital status: Married    Spouse name: Judson Roch  . Number of children: 0  . Years of education: 14   Occupational History  . Physiological scientist) Other   Social History Main Topics  . Smoking status: Never Smoker  . Smokeless tobacco: Never Used  . Alcohol use 0.0 oz/week     Comment: socially  . 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   Review of Systems History of seizures not in some time. Neurology follow-up Aprilhis her activity.  Objective:   Physical Exam BP 130/74 (BP Location: Left Arm, Patient Position: Sitting, Cuff Size: Normal)   Pulse 84 Comment: irregular  Ht 5\' 8"  (1.727 m) Comment: height measured without shoes  Wt 161 lb 4 oz (73.1 kg)   BMI 24.52 kg/m  Eyes are anicteric Lungs clear bilaterally posterior Normal heart sounds  20 minutes time spent with patient > half in counseling coordination of care

## 2016-11-06 NOTE — Patient Instructions (Signed)

## 2016-11-06 NOTE — Telephone Encounter (Signed)
   Trigger [facility] [birth year] [id number]  Dear Dr. Lacinda Axon:  We have scheduled the above named patient for a(n) Colonoscopy procedure. Our records show that (s)he is on anticoagulation therapy.  Please advise as to whether the patient may come off their therapy of Xarelto 1 day prior to their procedure which is scheduled for 01/04/17.  Please route your response to Marlon Pel, CMA or fax response to [phone].  Sincerely,    [facility]

## 2016-11-08 NOTE — Telephone Encounter (Signed)
Please advise Dr Stark  

## 2016-11-08 NOTE — Telephone Encounter (Signed)
Hi,  Douglas Mercer has antiphospholipid syndrome. He has had several DVT/PE's. He is at high risk and I do not recommend that he stop/hold his medication. If this is a concern, he will need to see Hematology.   [place]

## 2016-11-08 NOTE — Telephone Encounter (Signed)
I will ask Dr. Marin Olp

## 2016-11-08 NOTE — Telephone Encounter (Signed)
Forwarding to Dr. Carlean Purl. This is his patient. Marland Kitchen

## 2016-11-09 NOTE — Telephone Encounter (Signed)
Patient informed to take last dose on the day before the procedure( prep day) and to resume possibly the night of Colonoscopy or the next day. That will be decided and told to him on procedure day. Patient verbalized understanding.

## 2016-11-09 NOTE — Telephone Encounter (Signed)
  See below - we have clearance to do it this way  Please advise the patient last dose of Xarelto AM of prep day  Ennever, Rudell Cobb, MD  Gatha Mayer, MD        That is perfect Glendell Docker pete   Previous Messages    ----- Message -----  From: Gatha Mayer, MD  Sent: 11/08/2016  8:52 AM  To: Volanda Napoleon, MD  Subject: [facility]veda Reasons hold                   Pete,   This man needs a colonoscopy.   I was going to hold Xarelto with last dose 1 day before procedure and restart as soon as possible - usually the night of colonoscopy vs next day.   He has anti-phospholipid antibody syndrome and hx DVT/PE.   What do you think?   Thanks   Glendell Docker

## 2016-11-15 ENCOUNTER — Other Ambulatory Visit: Payer: Self-pay | Admitting: Family Medicine

## 2016-12-11 ENCOUNTER — Other Ambulatory Visit: Payer: Self-pay | Admitting: Family Medicine

## 2016-12-25 ENCOUNTER — Other Ambulatory Visit: Payer: Self-pay | Admitting: Family Medicine

## 2016-12-27 ENCOUNTER — Other Ambulatory Visit: Payer: Self-pay | Admitting: Family Medicine

## 2016-12-27 NOTE — Telephone Encounter (Signed)
See message below , he is re-establishing with PCP in [facility]  01/16/17 next office visit

## 2016-12-27 NOTE — Telephone Encounter (Signed)
Already refilled

## 2016-12-27 NOTE — Telephone Encounter (Signed)
Pt. Called in again about refill for medication for Xarelto and Pantoprazole. Pt. Uses [facility] on [address] in [place]

## 2016-12-27 NOTE — Telephone Encounter (Signed)
Copied from [place] [id number]. Topic: Quick Communication - See Telephone Encounter >> Dec 27, 2016  8:06 AM Ether Griffins B wrote: CRM for notification. See Telephone encounter for:  Pt needing xarelto and pantoprazole refilled. Dr Lacinda Axon use to be his doc he is seeing a new doctor on the 19th of this month and just needs a refill until he sees his new pcp.  12/27/16.

## 2016-12-27 NOTE — Telephone Encounter (Signed)
Refill sent to pharmacy. Please inform patient.

## 2016-12-27 NOTE — Telephone Encounter (Signed)
Patient is scheduled to start at another clinic with in 11/ 19/18 at [place] to fill til appointment.

## 2016-12-27 NOTE — Telephone Encounter (Signed)
Please advise 

## 2016-12-29 ENCOUNTER — Other Ambulatory Visit: Payer: Self-pay | Admitting: Family Medicine

## 2016-12-30 ENCOUNTER — Encounter: Payer: Self-pay | Admitting: Family Medicine

## 2017-01-01 ENCOUNTER — Other Ambulatory Visit: Payer: Self-pay | Admitting: Family Medicine

## 2017-01-01 MED ORDER — FENOFIBRATE 160 MG PO TABS: ORAL_TABLET | ORAL | 0 refills | Status: DC

## 2017-01-04 ENCOUNTER — Other Ambulatory Visit: Payer: Self-pay

## 2017-01-04 ENCOUNTER — Ambulatory Visit (AMBULATORY_SURGERY_CENTER): Payer: Medicare Other | Admitting: Internal Medicine

## 2017-01-04 ENCOUNTER — Encounter: Payer: Self-pay | Admitting: Internal Medicine

## 2017-01-04 VITALS — BP 100/60 | HR 69 | Temp 98.4°F | Resp 15 | Ht 68.0 in | Wt 161.0 lb

## 2017-01-04 DIAGNOSIS — D123 Benign neoplasm of transverse colon: Secondary | ICD-10-CM

## 2017-01-04 DIAGNOSIS — Z86010 Personal history of colonic polyps: Secondary | ICD-10-CM

## 2017-01-04 DIAGNOSIS — D124 Benign neoplasm of descending colon: Secondary | ICD-10-CM

## 2017-01-04 MED ORDER — SODIUM CHLORIDE 0.9 % IV SOLN: 500.0000 mL | INTRAVENOUS | Status: DC

## 2017-01-04 NOTE — Patient Instructions (Addendum)
I found and removed 3 small polyps - all look benign. I will let you know pathology results and when to have another routine colonoscopy by mail and/or My Chart.  Restart Xarelto tomorrow please.  I appreciate the opportunity to care for you. Gatha Mayer, MD, FACG  YOU HAD AN ENDOSCOPIC PROCEDURE TODAY AT [facility]:   Refer to the procedure report that was given to you for any specific questions about what was found during the examination.  If the procedure report does not answer your questions, please call your gastroenterologist to clarify.  If you requested that your care partner not be given the details of your procedure findings, then the procedure report has been included in a sealed envelope for you to review at your convenience later.  YOU SHOULD EXPECT: Some feelings of bloating in the abdomen. Passage of more gas than usual.  Walking can help get rid of the air that was put into your GI tract during the procedure and reduce the bloating. If you had a lower endoscopy (such as a colonoscopy or flexible sigmoidoscopy) you may notice spotting of blood in your stool or on the toilet paper. If you underwent a bowel prep for your procedure, you may not have a normal bowel movement for a few days.  Please Note:  You might notice some irritation and congestion in your nose or some drainage.  This is from the oxygen used during your procedure.  There is no need for concern and it should clear up in a day or so.  SYMPTOMS TO REPORT IMMEDIATELY:   Following lower endoscopy (colonoscopy or flexible sigmoidoscopy):  Excessive amounts of blood in the stool  Significant tenderness or worsening of abdominal pains  Swelling of the abdomen that is new, acute  Fever of 100F or higher  For urgent or emergent issues, a gastroenterologist can be reached at any hour by calling [phone].  DIET:  We do recommend a small meal at first, but then you may proceed to your  regular diet.  Drink plenty of fluids but you should avoid alcoholic beverages for 24 hours.  ACTIVITY:  You should plan to take it easy for the rest of today and you should NOT DRIVE or use heavy machinery until tomorrow (because of the sedation medicines used during the test).    FOLLOW UP: Our staff will call the number listed on your records the next business day following your procedure to check on you and address any questions or concerns that you may have regarding the information given to you following your procedure. If we do not reach you, we will leave a message.  However, if you are feeling well and you are not experiencing any problems, there is no need to return our call.  We will assume that you have returned to your regular daily activities without incident.  If any biopsies were taken you will be contacted by phone or by letter within the next 1-3 weeks.  Please call us at [phone] if you have not heard about the biopsies in 3 weeks.   SIGNATURES/CONFIDENTIALITY: You and/or your care partner have signed paperwork which will be entered into your electronic medical record.  These signatures attest to the fact that that the information above on your After Visit Summary has been reviewed and is understood.  Full responsibility of the confidentiality of this discharge information lies with you and/or your care-partner.  Please read over handouts about polyps, diverticulosis  and hemorrhoids  Your blood sugar in the recovery room was 130  Await pathology  Continue medications per Dr. Celesta Aver instructions

## 2017-01-04 NOTE — Op Note (Signed)
[facility] Patient Name: Douglas Mercer Procedure Date: 01/04/2017 10:49 AM MRN: [id number] Endoscopist: Gatha Mayer , MD Age: 71 Referring MD:  Date of Birth: Mar 02, 1945 Gender: Male Account #: [id number] Procedure:                Colonoscopy Indications:              Surveillance: Personal history of adenomatous                            polyps on last colonoscopy 5 years ago Medicines:                Propofol per Anesthesia, Monitored Anesthesia Care Procedure:                Pre-Anesthesia Assessment:                           - Prior to the procedure, a History and Physical                            was performed, and patient medications and                            allergies were reviewed. The patient's tolerance of                            previous anesthesia was also reviewed. The risks                            and benefits of the procedure and the sedation                            options and risks were discussed with the patient.                            All questions were answered, and informed consent                            was obtained. Prior Anticoagulants: The patient                            last took Xarelto (rivaroxaban) 2 days prior to the                            procedure. ASA Grade Assessment: II - A patient                            with mild systemic disease. After reviewing the                            risks and benefits, the patient was deemed in                            satisfactory condition to undergo the procedure.  After obtaining informed consent, the colonoscope                            was passed under direct vision. Throughout the                            procedure, the patient's blood pressure, pulse, and                            oxygen saturations were monitored continuously. The                            Colonoscope was introduced through the anus and     advanced to the the cecum, identified by                            appendiceal orifice and ileocecal valve. The                            colonoscopy was performed without difficulty. The                            patient tolerated the procedure well. The quality                            of the bowel preparation was good. The bowel                            preparation used was Miralax. The ileocecal valve,                            appendiceal orifice, and rectum were photographed. Scope In: 10:54:58 AM Scope Out: 11:10:16 AM Scope Withdrawal Time: 0 hours 11 minutes 22 seconds  Total Procedure Duration: 0 hours 15 minutes 18 seconds  Findings:                 The perianal and digital rectal examinations were                            normal. Pertinent negatives include normal prostate                            (size, shape, and consistency).                           Three sessile polyps were found in the descending                            colon and transverse colon. The polyps were                            diminutive in size. These polyps were removed with  a cold snare. Resection and retrieval were                            complete. Verification of patient identification                            for the specimen was done. Estimated blood loss was                            minimal.                           Multiple diverticula were found in the sigmoid                            colon.                           Internal hemorrhoids were found during retroflexion.                           The exam was otherwise without abnormality on                            direct and retroflexion views. Complications:            No immediate complications. Estimated Blood Loss:     Estimated blood loss was minimal. Impression:               - Three diminutive polyps in the descending colon                            and in the transverse colon,  removed with a cold                            snare. Resected and retrieved.                           - Diverticulosis in the sigmoid colon.                           - Internal hemorrhoids.                           - The examination was otherwise normal on direct                            and retroflexion views.                           - Personal history of colonic polyps. Adenomas -                            last found 1 in 2013 Recommendation:           - Patient has a contact number available for  emergencies. The signs and symptoms of potential                            delayed complications were discussed with the                            patient. Return to normal activities tomorrow.                            Written discharge instructions were provided to the                            patient.                           - Resume Xarelto (rivaroxaban) at prior dose                            tomorrow.                           - Repeat colonoscopy is recommended for                            surveillance. The colonoscopy date will be                            determined after pathology results from today's                            exam become available for review.                           - Resume previous diet. Gatha Mayer, MD 01/04/2017 11:16:51 AM This report has been signed electronically.

## 2017-01-04 NOTE — Progress Notes (Signed)
Called to room to assist during endoscopic procedure.  Patient ID and intended procedure confirmed with present staff. Received instructions for my participation in the procedure from the performing physician.  

## 2017-01-04 NOTE — Progress Notes (Signed)
To PACU, VSS. Report to [email or web address] 

## 2017-01-05 ENCOUNTER — Telehealth: Payer: Self-pay

## 2017-01-05 NOTE — Telephone Encounter (Signed)
  Follow up Call-  Call back number 01/04/2017  Post procedure Call Back phone  # [phone]  Permission to leave phone message Yes  Some recent data might be hidden     Patient questions:  Do you have a fever, pain , or abdominal swelling? No. Pain Score  0 *  Have you tolerated food without any problems? Yes.    Have you been able to return to your normal activities? Yes.    Do you have any questions about your discharge instructions: Diet   No. Medications  No. Follow up visit  No.  Do you have questions or concerns about your Care? No.  Actions: * If pain score is 4 or above:  Talked with pt.'s wife.  She reports pt. Is doing fine.   Has returned to diet and activities without difficulty. No action needed, pain <4.

## 2017-01-07 ENCOUNTER — Telehealth: Payer: Self-pay | Admitting: Internal Medicine

## 2017-01-07 ENCOUNTER — Encounter (HOSPITAL_BASED_OUTPATIENT_CLINIC_OR_DEPARTMENT_OTHER): Payer: Self-pay | Admitting: Emergency Medicine

## 2017-01-07 ENCOUNTER — Emergency Department (HOSPITAL_BASED_OUTPATIENT_CLINIC_OR_DEPARTMENT_OTHER)
Admission: EM | Admit: 2017-01-07 | Discharge: 2017-01-07 | Disposition: A | Discharge status: Home / Self Care | Payer: Medicare Other | Attending: Emergency Medicine | Admitting: Emergency Medicine

## 2017-01-07 ENCOUNTER — Emergency Department (HOSPITAL_BASED_OUTPATIENT_CLINIC_OR_DEPARTMENT_OTHER): Payer: Medicare Other

## 2017-01-07 ENCOUNTER — Other Ambulatory Visit: Payer: Self-pay

## 2017-01-07 DIAGNOSIS — Z79899 Other long term (current) drug therapy: Secondary | ICD-10-CM | POA: Diagnosis not present

## 2017-01-07 DIAGNOSIS — Z7984 Long term (current) use of oral hypoglycemic drugs: Secondary | ICD-10-CM | POA: Insufficient documentation

## 2017-01-07 DIAGNOSIS — J181 Lobar pneumonia, unspecified organism: Secondary | ICD-10-CM | POA: Insufficient documentation

## 2017-01-07 DIAGNOSIS — Z7982 Long term (current) use of aspirin: Secondary | ICD-10-CM | POA: Insufficient documentation

## 2017-01-07 DIAGNOSIS — R509 Fever, unspecified: Secondary | ICD-10-CM | POA: Diagnosis present

## 2017-01-07 DIAGNOSIS — I10 Essential (primary) hypertension: Secondary | ICD-10-CM | POA: Diagnosis not present

## 2017-01-07 DIAGNOSIS — Z86718 Personal history of other venous thrombosis and embolism: Secondary | ICD-10-CM | POA: Diagnosis not present

## 2017-01-07 DIAGNOSIS — E119 Type 2 diabetes mellitus without complications: Secondary | ICD-10-CM | POA: Diagnosis not present

## 2017-01-07 DIAGNOSIS — J9811 Atelectasis: Secondary | ICD-10-CM | POA: Insufficient documentation

## 2017-01-07 DIAGNOSIS — J189 Pneumonia, unspecified organism: Secondary | ICD-10-CM

## 2017-01-07 LAB — URINALYSIS, ROUTINE W REFLEX MICROSCOPIC
Bilirubin Urine: NEGATIVE
GLUCOSE, UA: NEGATIVE mg/dL
Ketones, ur: NEGATIVE mg/dL
LEUKOCYTES UA: NEGATIVE
NITRITE: NEGATIVE
PH: 7 (ref 5.0–8.0)
PROTEIN: NEGATIVE mg/dL
Specific Gravity, Urine: <1.005 — ABNORMAL LOW (ref 1.005–1.030)

## 2017-01-07 LAB — CBC WITH DIFFERENTIAL/PLATELET
BASOS PCT: 0 %
Basophils Absolute: 0 10*3/uL (ref 0.0–0.1)
Eosinophils Absolute: 0 10*3/uL (ref 0.0–0.7)
Eosinophils Relative: 0 %
HEMATOCRIT: 42.7 % (ref 39.0–52.0)
HEMOGLOBIN: 14.2 g/dL (ref 13.0–17.0)
LYMPHS ABS: 0.5 10*3/uL — AB (ref 0.7–4.0)
Lymphocytes Relative: 10 %
MCH: 30.2 pg (ref 26.0–34.0)
MCHC: 33.3 g/dL (ref 30.0–36.0)
MCV: 90.9 fL (ref 78.0–100.0)
MONO ABS: 0.3 10*3/uL (ref 0.1–1.0)
MONOS PCT: 5 %
NEUTROS ABS: 4.2 10*3/uL (ref 1.7–7.7)
NEUTROS PCT: 85 %
Platelets: 156 10*3/uL (ref 150–400)
RBC: 4.7 MIL/uL (ref 4.22–5.81)
RDW: 13.5 % (ref 11.5–15.5)
WBC: 5 10*3/uL (ref 4.0–10.5)

## 2017-01-07 LAB — COMPREHENSIVE METABOLIC PANEL
ALK PHOS: 39 U/L (ref 38–126)
ALT: 21 U/L (ref 17–63)
AST: 26 U/L (ref 15–41)
Albumin: 4 g/dL (ref 3.5–5.0)
Anion gap: 6 (ref 5–15)
BUN: 16 mg/dL (ref 6–20)
CHLORIDE: 103 mmol/L (ref 101–111)
CO2: 25 mmol/L (ref 22–32)
CREATININE: 1.06 mg/dL (ref 0.61–1.24)
Calcium: 9.3 mg/dL (ref 8.9–10.3)
GFR calc Af Amer: >60 mL/min (ref >60)
Glucose, Bld: 84 mg/dL (ref 65–99)
Potassium: 3.9 mmol/L (ref 3.5–5.1)
Sodium: 134 mmol/L — ABNORMAL LOW (ref 135–145)
Total Bilirubin: 1.1 mg/dL (ref 0.3–1.2)
Total Protein: 6.9 g/dL (ref 6.5–8.1)

## 2017-01-07 LAB — INFLUENZA PANEL BY PCR (TYPE A & B)
INFLBPCR: NEGATIVE
Influenza A By PCR: NEGATIVE

## 2017-01-07 LAB — D-DIMER, QUANTITATIVE: D-Dimer, Quant: 0.31 ug/mL-FEU (ref 0.00–0.50)

## 2017-01-07 LAB — URINALYSIS, MICROSCOPIC (REFLEX)
Squamous Epithelial / LPF: NONE SEEN
WBC UA: NONE SEEN WBC/hpf (ref 0–5)

## 2017-01-07 LAB — I-STAT CG4 LACTIC ACID, ED: LACTIC ACID, VENOUS: 0.89 mmol/L (ref 0.5–1.9)

## 2017-01-07 IMAGING — DX DG ABDOMEN 2V
3 series · 3 of 3 positions shown · non-contrast
Comparison: CT [DATE]

CLINICAL DATA: colonoscopy 3 days ago (reviewed). No problems until
last night when he developed a fever to 102. Described as lethargic.
No GI complaints Denies chest pain or abdominal pain, hx DVT and PE
on therapeutic blood thinners Hx TURP, inguinal hernia repair,DM

EXAM:
ABDOMEN - 2 VIEW

[abdomen erect]
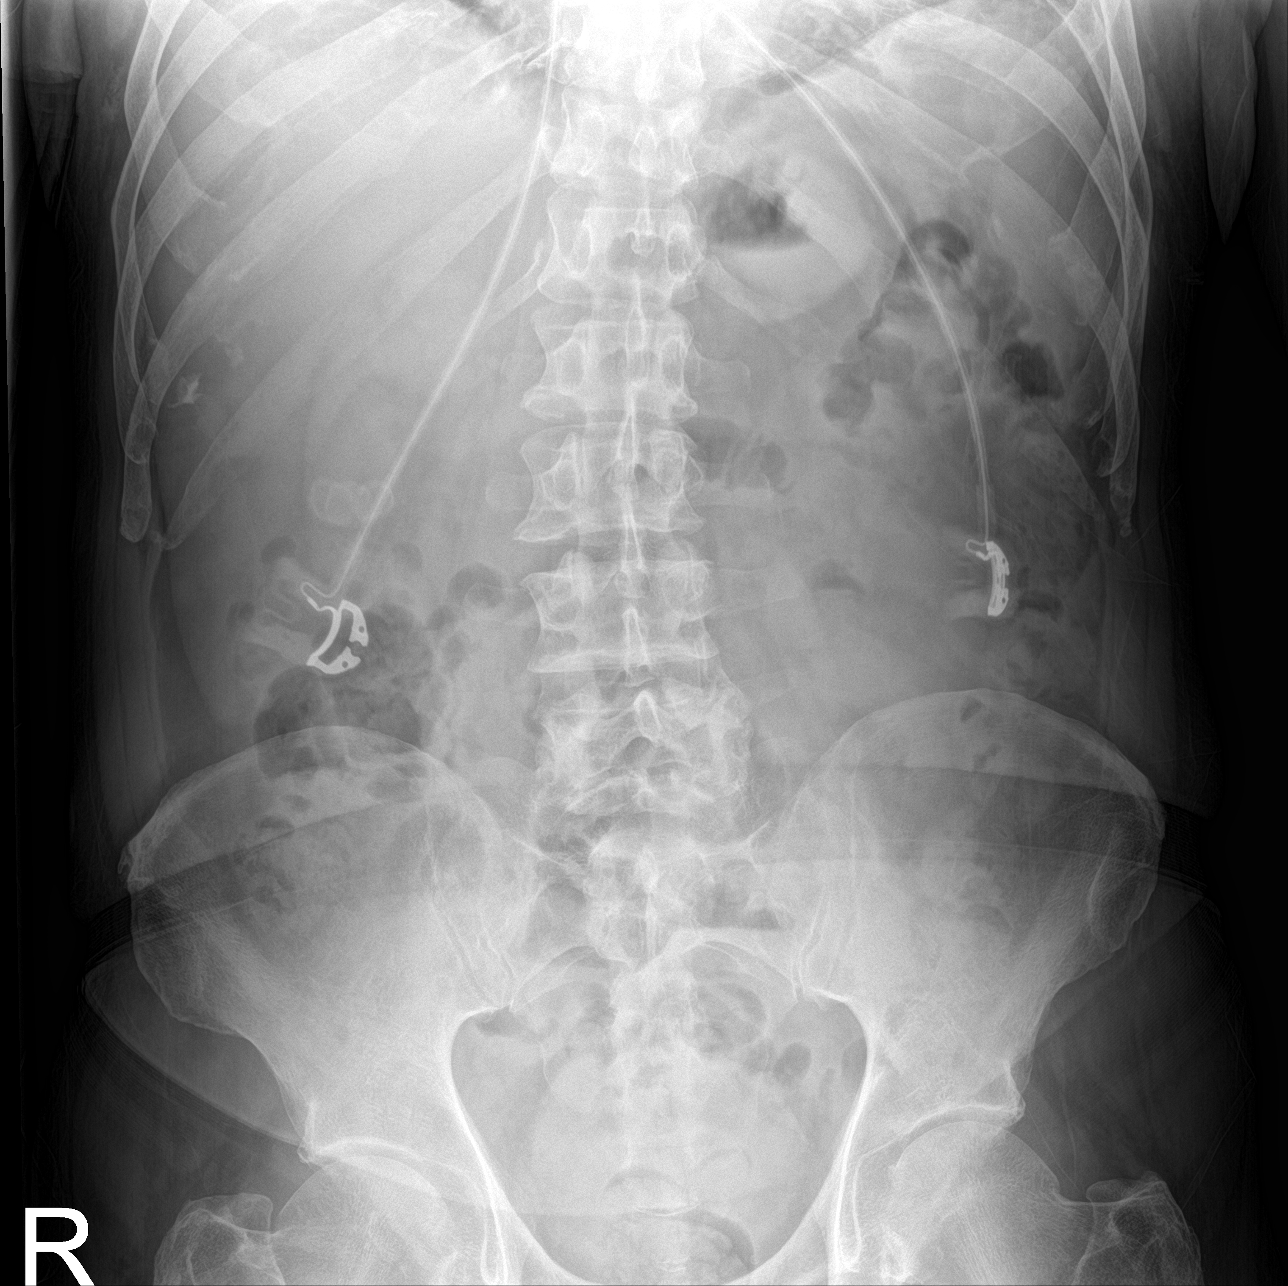

[abdomen supine (1 of 2)]
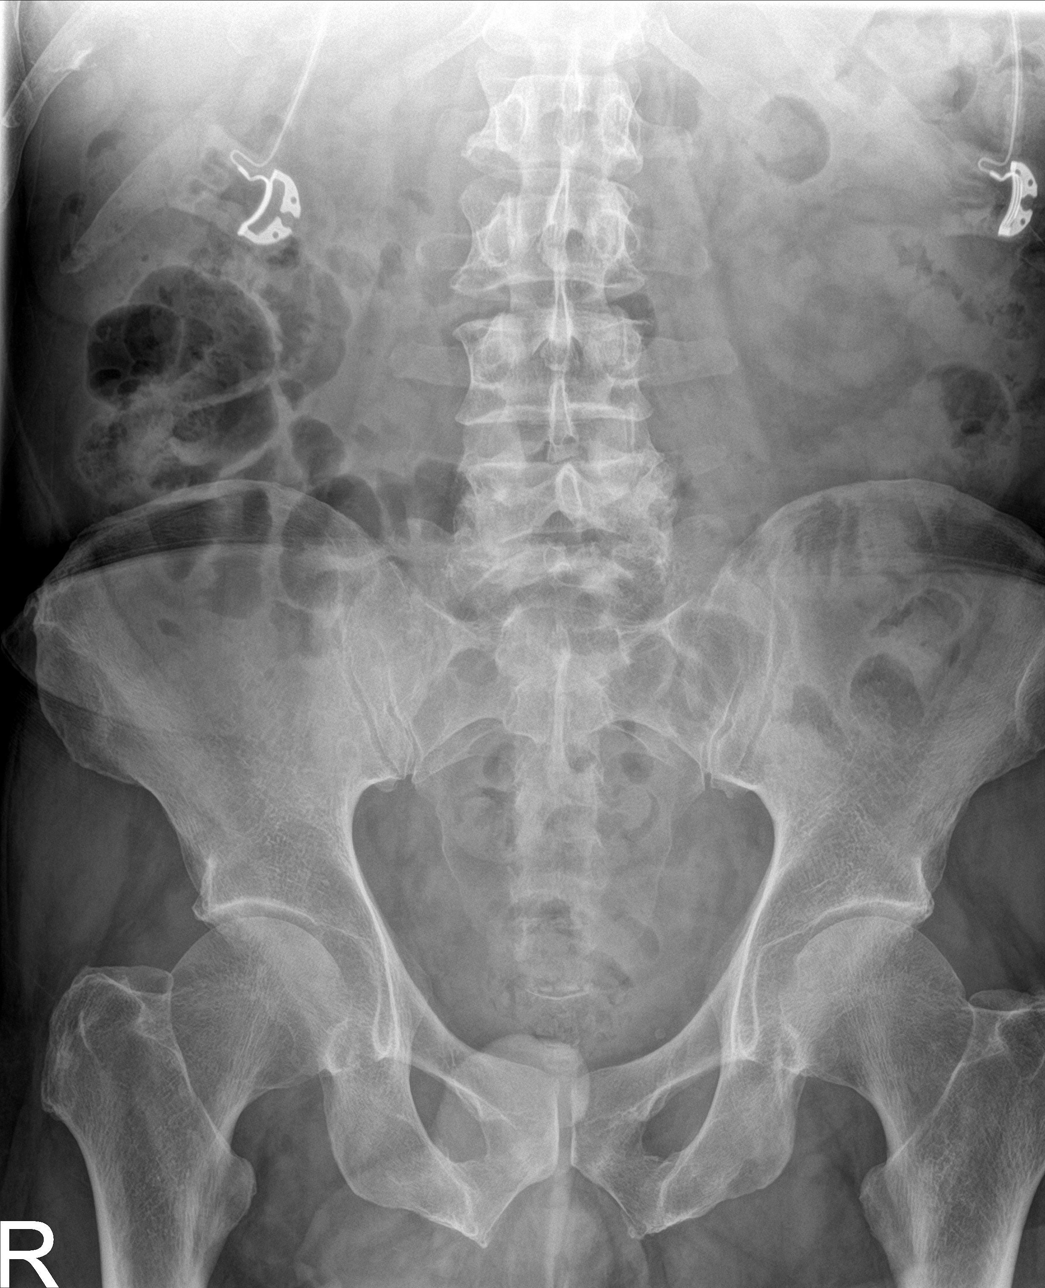

[abdomen supine (2 of 2)]
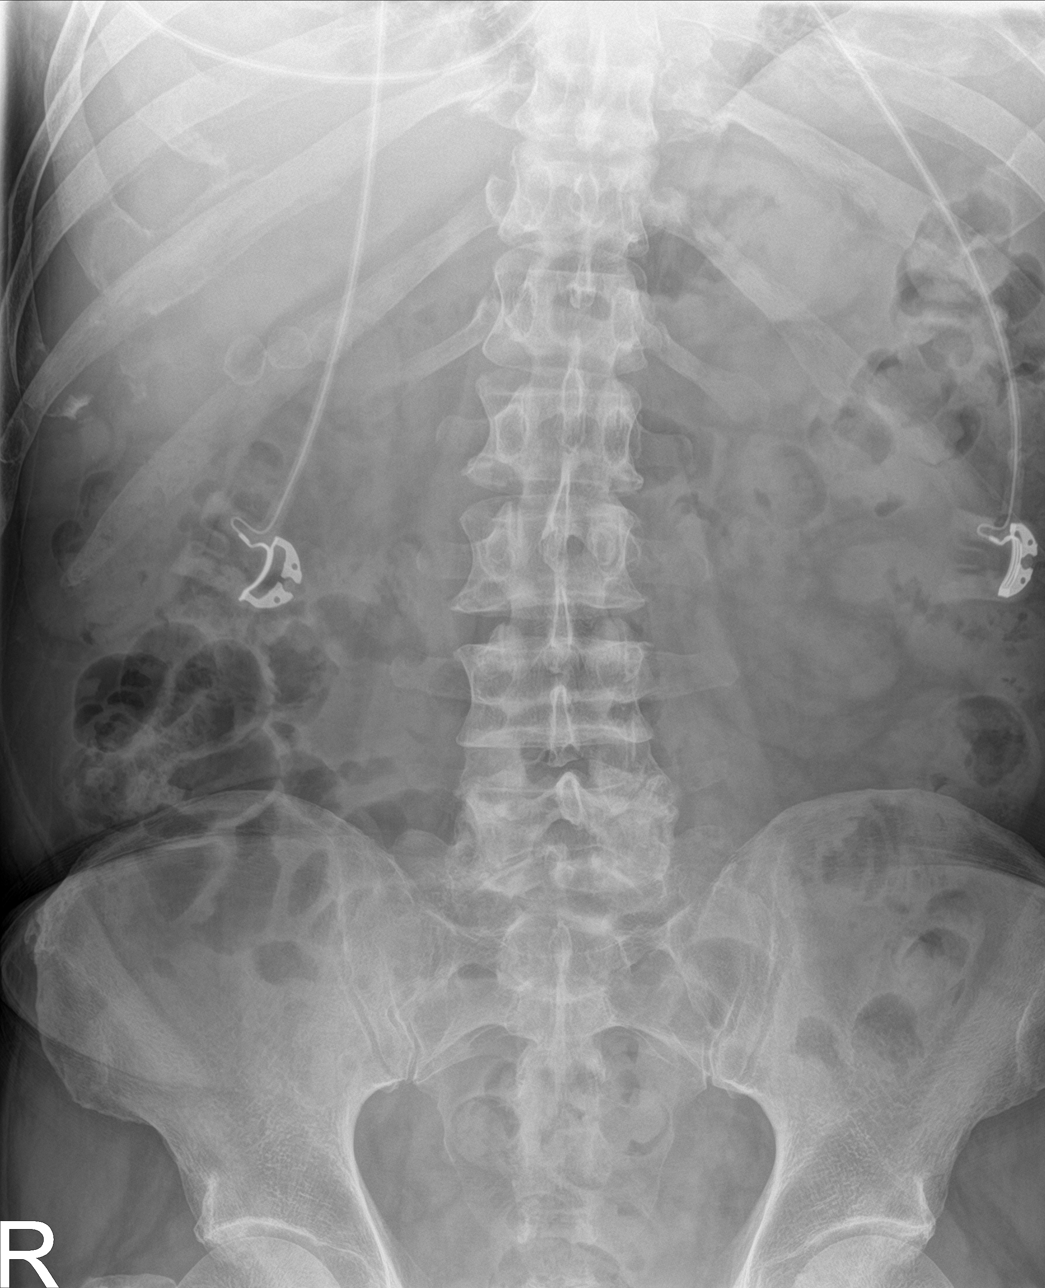

[3 of 3 positions shown; findings below may reference images not displayed]

FINDINGS: Peripherally calcified gallstones in the right upper abdomen.

No free air.

Normal bowel gas pattern.

Regional bones unremarkable.
IMPRESSION: Cholelithiasis

Normal bowel gas pattern.

## 2017-01-07 MED ORDER — ACETAMINOPHEN 500 MG PO TABS
1000.0000 mg | ORAL_TABLET | Freq: Once | ORAL | Status: AC
  Administered 2017-01-07: 1000 mg via ORAL
  Filled 2017-01-07: qty 2

## 2017-01-07 MED ORDER — IOPAMIDOL (ISOVUE-370) INJECTION 76%
100.0000 mL | Freq: Once | INTRAVENOUS | Status: AC | PRN
  Administered 2017-01-07: 100 mL via INTRAVENOUS

## 2017-01-07 MED ORDER — SODIUM CHLORIDE 0.9 % IV BOLUS (SEPSIS)
1000.0000 mL | Freq: Once | INTRAVENOUS | Status: AC
  Administered 2017-01-07: 1000 mL via INTRAVENOUS

## 2017-01-07 MED ORDER — AZITHROMYCIN 500 MG IV SOLR
INTRAVENOUS | Status: AC
  Filled 2017-01-07: qty 500

## 2017-01-07 MED ORDER — AZITHROMYCIN 250 MG PO TABS: 250.0000 mg | ORAL_TABLET | Freq: Every day | ORAL | 0 refills | Status: DC

## 2017-01-07 MED ORDER — DEXTROSE 5 % IV SOLN
2.0000 g | Freq: Once | INTRAVENOUS | Status: AC
  Administered 2017-01-07: 2 g via INTRAVENOUS
  Filled 2017-01-07: qty 2

## 2017-01-07 MED ORDER — AMOXICILLIN-POT CLAVULANATE 875-125 MG PO TABS: 1.0000 | ORAL_TABLET | Freq: Two times a day (BID) | ORAL | 0 refills | Status: AC

## 2017-01-07 MED ORDER — DEXTROSE 5 % IV SOLN
500.0000 mg | Freq: Once | INTRAVENOUS | Status: AC
  Administered 2017-01-07: 500 mg via INTRAVENOUS
  Filled 2017-01-07: qty 500

## 2017-01-07 NOTE — ED Notes (Signed)
ED Provider at bedside. 

## 2017-01-07 NOTE — Telephone Encounter (Signed)
Wife called. Patient had colonoscopy 3 days ago (reviewed). No problems until last night when he developed a fever to 102. Described as lethargic. No GI complaints. I told her that his problem is unrelated to the procedure but sound significant and she needs to take him to ER ASAP for formal evaluation. She agrees.

## 2017-01-07 NOTE — ED Provider Notes (Signed)
[facility] Provider Note   CSN: [id number] Arrival date & time: 01/07/17  1106     History   Chief Complaint Chief Complaint  Patient presents with  . Fever    HPI Douglas Mercer is a 71 y.o. male.  HPI   71 yo M with PMHx as below here with fever. Pt just underwent colonoscopy 3 days ago. He held his Xarelto x 2 days then resumed it. He states that last night, he suddenly felt unwell and had a fever to 101. He had NO other symptoms. He felt fatigued, tired but no CP, cough, SOB, abdominal pain, n/v/d. He has had no further sx, only fatigue. He is having normal bowel movements. Denies any pain in his bilateral legs. No pleurisy. No recent sick contacts but did have procedure as mentioned. He does admit he has not been moving around as much for 2 days but o/w has been eating,dirnking normally. Wife is concerned because pt reportedly had similar sx 2/2 DVT in the past.  Past Medical History:  Diagnosis Date  . Allergic rhinitis   . Anti-phospholipid antibody syndrome (HCC)   . BPH (benign prostatic hypertrophy)   . Diabetes mellitus    type 2  . DVT (deep venous thrombosis) ([facility]) 02/2009   large left lower  . ED (erectile dysfunction)   . GERD (gastroesophageal reflux disease)   . Glaucoma   . Hyperlipidemia   . Hypertension   . Internal hemorrhoids   . Pulmonary embolism ([place]) 1992   bilateral w/ PE reportedly (+) lupus anticoagulant  . Rosacea   . Tubular adenoma of colon   . Type 2 diabetes mellitus without complication, without long-term current use of insulin ([place]) 07/14/2015    Patient Active Problem List   Diagnosis Date Noted  . Hypertension   . Simple partial onset seizures followed by impaired consciousness ([place]) 11/19/2015  . Type 2 diabetes mellitus without complication, without long-term current use of insulin ([place]) 07/14/2015  . Anti-phospholipid antibody syndrome (HCC)   . Personal history of colonic polyps - adenomas  05/12/2011  . Hyperlipidemia 03/08/2006  . Rosacea 03/07/2006  . BPH (benign prostatic hyperplasia) 03/07/2006    Past Surgical History:  Procedure Laterality Date  . COLONOSCOPY  05/07/2006  . colonoscopy with polypectomy  07/07/2002   2 small descending colon polyps  . colonoscopy with polypectomy  04/19/2001   77mm sessile mucosal polyp in distal sigmoid colon  . diverticulosis  07/07/2002,04/19/2001   mild sigmoid  . ESOPHAGOGASTRODUODENOSCOPY    . [place] /2011   right-sided, mesh placed  . PROSTATE SURGERY     18 biopsies.  Benign.  . TONSILLECTOMY    . TRANSURETHRAL RESECTION OF PROSTATE  08/13/09       Home Medications    Prior to Admission medications   Medication Sig Start Date End Date Taking? Authorizing Provider  amoxicillin-clavulanate (AUGMENTIN) 875-125 MG tablet Take 1 tablet every 12 (twelve) hours for 7 days by mouth. 01/07/17 01/14/17  Duffy Bruce, MD  aspirin 81 MG tablet Take 81 mg by mouth daily.    [provider]  atorvastatin (LIPITOR) 10 MG tablet Take 1 tablet (10 mg total) by mouth daily. 01/11/16   Coral Spikes, DO  azithromycin (ZITHROMAX) 250 MG tablet Take 1 tablet (250 mg total) daily by mouth. Take first 2 tablets together, then 1 every day until finished. 01/07/17   Duffy Bruce, MD  co-enzyme Q-10 30 MG capsule Take  100 mg by mouth every morning.     [provider]  fenofibrate 160 MG tablet TAKE 1 TABLET(160 MG) BY MOUTH DAILY 01/01/17   Leone Haven, MD  finasteride (PROSCAR) 5 MG tablet Take 5 mg by mouth daily.  05/09/16   [provider]  fish oil-omega-3 fatty acids 1000 MG capsule Take 2 g by mouth daily.      [provider]  fluticasone (FLONASE) 50 MCG/ACT nasal spray Place 2 sprays into the nose daily. Patient not taking: Reported on 11/06/2016 02/05/12   Colon Branch, MD  glipiZIDE (GLUCOTROL XL) 5 MG 24 hr tablet TAKE 1 TABLET(5 MG) BY MOUTH DAILY WITH BREAKFAST  11/15/16   Cook, Jayce G, DO  latanoprost (XALATAN) 0.005 % ophthalmic solution INT 1 GTT INTO OU HS 03/06/16   [provider]  levETIRAcetam (KEPPRA) 750 MG tablet Take 1 tablet (750 mg total) by mouth 2 (two) times daily. 06/12/16   Melvenia Beam, MD  metFORMIN (GLUCOPHAGE) 850 MG tablet TAKE 1 TABLET BY MOUTH TWICE DAILY WITH A MEAL 12/11/16   Leone Haven, MD  Multiple Vitamin (MULTIVITAMIN) capsule Diabetic health Pack 6-7 pills a day    [provider]  pantoprazole (PROTONIX) 40 MG tablet TAKE 1 TABLET(40 MG) BY MOUTH DAILY BEFORE BREAKFAST 12/27/16   Leone Haven, MD  Propylene Glycol (SYSTANE BALANCE) 0.6 % SOLN Place 1 drop into both eyes 2 (two) times daily.    [provider]  tetracycline (ACHROMYCIN,SUMYCIN) 500 MG capsule Take 1 capsule by mouth weekly as needed for roscasea 05/24/15   [provider]  XARELTO 20 MG TABS tablet TAKE 1 TABLET BY MOUTH DAILY AT 12:00 NOON 12/27/16   Leone Haven, MD    Family History Family History  Problem Relation Age of Onset  . Diabetes Mother   . Melanoma Mother   . COPD Mother   . COPD Father   . Heart attack Maternal Uncle   . Stroke Maternal Aunt        grandparents  . Colon cancer Paternal Uncle        x2  . Prostate cancer Neg Hx   . Seizures Neg Hx   . Esophageal cancer Neg Hx     Social History Social History   Tobacco Use  . Smoking status: Never Smoker  . Smokeless tobacco: Never Used  Substance Use Topics  . Alcohol use: Yes    Alcohol/week: 0.0 oz    Comment: socially  . Drug use: No     Allergies   Patient has no known allergies.   Review of Systems Review of Systems  Constitutional: Positive for fatigue and fever.  All other systems reviewed and are negative.    Physical Exam Updated Vital Signs BP 121/67   Pulse 80   Temp 99.3 F (37.4 C) (Oral)   Resp (!) 22   Ht 5\' 8"  (1.727 m)   Wt 73.9 kg (163 lb)   SpO2 98%   BMI 24.78 kg/m    Physical Exam  Constitutional: He is oriented to person, place, and time. He appears well-developed and well-nourished. No distress.  HENT:  Head: Normocephalic and atraumatic.  Eyes: Conjunctivae are normal.  Neck: Neck supple.  Cardiovascular: Normal rate, regular rhythm and normal heart sounds. Exam reveals no friction rub.  No murmur heard. Pulmonary/Chest: Effort normal and breath sounds normal. No respiratory distress. He has no wheezes. He has no rales.  Bibasilar rales, clear  with coughing  Abdominal: Soft. He exhibits no distension.  Musculoskeletal: He exhibits no edema.  Neurological: He is alert and oriented to person, place, and time. He exhibits normal muscle tone.  Skin: Skin is warm. Capillary refill takes less than 2 seconds.  Psychiatric: He has a normal mood and affect.  Nursing note and vitals reviewed.    ED Treatments / Results  Labs (all labs ordered are listed, but only abnormal results are displayed) Labs Reviewed  CBC WITH DIFFERENTIAL/PLATELET - Abnormal; Notable for the following components:      Result Value   Lymphs Abs 0.5 (*)    All other components within normal limits  COMPREHENSIVE METABOLIC PANEL - Abnormal; Notable for the following components:   Sodium 134 (*)    All other components within normal limits  URINALYSIS, ROUTINE W REFLEX MICROSCOPIC - Abnormal; Notable for the following components:   Color, Urine STRAW (*)    Specific Gravity, Urine <1.005 (*)    Hgb urine dipstick TRACE (*)    All other components within normal limits  URINALYSIS, MICROSCOPIC (REFLEX) - Abnormal; Notable for the following components:   Bacteria, UA RARE (*)    All other components within normal limits  D-DIMER, QUANTITATIVE (NOT AT [facility])  INFLUENZA PANEL BY PCR (TYPE A & B)  I-STAT CG4 LACTIC ACID, ED    EKG  EKG Interpretation None       Radiology Dg Chest 2 View  Result Date: 01/07/2017 CLINICAL DATA:  colonoscopy 3 days ago (reviewed). No  problems until last night when he developed a fever to 102. Described as lethargic. No GI complaintsDenies chest pain or abdominal pain, hx DVT and PE on therapeutic blood thinners Hx TURP, inguinal hernia repair,DM EXAM: CHEST - 2 VIEW COMPARISON:  08/02/2009 FINDINGS: Some linear opacities in the inferior lingula and posterior right lower lobe, new since prior study. Attenuated bronchovascular markings in the apices. Heart size and mediastinal contours are within normal limits. Chronic blunting of posterior costophrenic angles.  No pneumothorax. Visualized bones unremarkable. IMPRESSION: Linear opacities in the posterior right lower lobe and inferior lingula may represent early interstitial infiltrate versus interval scarring or atelectasis. Electronically Signed   By: Lucrezia Europe M.D.   On: 01/07/2017 13:36   Ct Angio Chest Pe W And/or Wo Contrast  Result Date: 01/07/2017 CLINICAL DATA:  Shortness of breath after colonoscopy 3 days ago. EXAM: CT ANGIOGRAPHY CHEST WITH CONTRAST TECHNIQUE: Multidetector CT imaging of the chest was performed using the standard protocol during bolus administration of intravenous contrast. Multiplanar CT image reconstructions and MIPs were obtained to evaluate the vascular anatomy. CONTRAST:  119mL ISOVUE-370 IOPAMIDOL (ISOVUE-370) INJECTION 76% COMPARISON:  None. FINDINGS: Cardiovascular: Heart size normal. No pericardial effusion. Coronary artery calcification is evident. No thoracic aortic aneurysm. No filling defect within the opacified pulmonary arteries to suggest the presence of an acute pulmonary embolus. Mediastinum/Nodes: No mediastinal lymphadenopathy. There is no hilar lymphadenopathy. The esophagus has normal imaging features. There is no axillary lymphadenopathy. Lungs/Pleura: Subsegmental atelectasis noted both lung bases. There is an area in the left lower lobe (image 53 series 6) The may be atelectatic but superimposed pneumonia could have this appearance. No  pleural effusion. 6 mm right lower lobe pulmonary nodule is seen on image 60 series 6. Upper Abdomen: Calcified gallstones. Musculoskeletal: Bone windows reveal no worrisome lytic or sclerotic osseous lesions. Review of the MIP images confirms the above findings. IMPRESSION: 1. Compressive atelectasis both lower lobes with potential pneumonia left lower lobe.  2. 6 mm right lower lobe pulmonary nodule. Non-contrast chest CT at 6-12 months is recommended. If the nodule is stable at time of repeat CT, then future CT at 18-24 months (from today's scan) is considered optional for low-risk patients, but is recommended for high-risk patients. This recommendation follows the consensus statement: Guidelines for Management of Incidental Pulmonary Nodules Detected on CT Images: From the Fleischner Society 2017; Radiology 2017; 284:228-243. 3. Cholelithiasis. Electronically Signed   By: Misty Stanley M.D.   On: 01/07/2017 14:51   US Venous Img Lower Bilateral  Result Date: 01/07/2017 CLINICAL DATA:  Fever x1 day. Bilateral leg swelling. History of DVT and PE. Diabetes. EXAM: BILATERAL LOWER EXTREMITY VENOUS DOPPLER ULTRASOUND TECHNIQUE: Gray-scale sonography with compression, as well as color and duplex ultrasound, were performed to evaluate the deep venous system from the level of the common femoral vein through the popliteal and proximal calf veins. COMPARISON:  None FINDINGS: On the right, linear nonocclusive filling defects in the common femoral vein. This results in incomplete compressibility although there is good color flow signal around this lesion. Deep femoral vein is normal. Linear filling defects in the distal femoral vein resulting in incomplete compressibility. Popliteal vein and calf veins are normal. Visualized segments of the saphenous venous system normal in caliber and compressibility. On the left, incomplete possibility of ectopic common femoral vein with some eccentric mural thrombus, incompletely  occlusive. Eccentric mural thrombus is also seen in the deep femoral vein, incompletely occlusive. Femoral vein is unremarkable. Linear eccentric thrombus in the popliteal vein, incompletely occlusive, resulting in incomplete compressibility. Calf veins are unremarkable. IMPRESSION: 1. Nonocclusive, linear, possibly chronic DVT in the right common femoral and femoral veins, and on the left in the common femoral, deep femoral and popliteal veins. Electronically Signed   By: Lucrezia Europe M.D.   On: 01/07/2017 13:33   Dg Abd 2 Views  Result Date: 01/07/2017 CLINICAL DATA:  colonoscopy 3 days ago (reviewed). No problems until last night when he developed a fever to 102. Described as lethargic. No GI complaints Denies chest pain or abdominal pain, hx DVT and PE on therapeutic blood thinners Hx TURP, inguinal hernia repair,DM EXAM: ABDOMEN - 2 VIEW COMPARISON:  CT 10/19/2011 FINDINGS: Peripherally calcified gallstones in the right upper abdomen. No free air. Normal bowel gas pattern. Regional bones unremarkable. IMPRESSION: Cholelithiasis Normal bowel gas pattern. Electronically Signed   By: Lucrezia Europe M.D.   On: 01/07/2017 13:38    Procedures Procedures (including critical care time)  Medications Ordered in ED Medications  sodium chloride 0.9 % bolus 1,000 mL (0 mLs Intravenous Stopped 01/07/17 1341)  acetaminophen (TYLENOL) tablet 1,000 mg (1,000 mg Oral Given 01/07/17 1341)  cefTRIAXone (ROCEPHIN) 2 g in dextrose 5 % 50 mL IVPB (0 g Intravenous Stopped 01/07/17 1606)  azithromycin (ZITHROMAX) 500 mg in dextrose 5 % 250 mL IVPB (0 mg Intravenous Stopped 01/07/17 1606)  iopamidol (ISOVUE-370) 76 % injection 100 mL (100 mLs Intravenous Contrast Given 01/07/17 1427)     Initial Impression / Assessment and Plan / ED Course  I have reviewed the triage vital signs and the nursing notes.  Pertinent labs & imaging results that were available during my care of the patient were reviewed by me and considered  in my medical decision making (see chart for details).     71 yo M with PMHx as above including DVT/PE on Xarelto here with fever, mild generalized fatigue s/p colonoscopy 3 days ago. Labs, imaging as above. I suspect  pt's sx are 2/2 atelectasis, possibly with superimposed early PNA. Pt has h/o DVT/PE with similar sx in the past so duplex U/S, CT Angio obtained and fortunately show no new signs of worsening clot burden (he was off Xarelto x 2 days for colonoscopy). He has chronic DVTs in b/l LE but is back on Xarelto, D-Dimer neg, suspect these are old. They are not occlusive. Otherwise, no signs of UTI. No evidence of sepsis or systemic infection. He remains afebrile, satting well on RA here, and in NAD.  Will give him IS, treat for possible early CAP, and advise outpt follow-up with good return precautions.  Final Clinical Impressions(s) / ED Diagnoses   Final diagnoses:  Fever  Atelectasis  Community acquired pneumonia of left lower lobe of lung [facility])    ED Discharge Orders        Ordered    amoxicillin-clavulanate (AUGMENTIN) 875-125 MG tablet  Every 12 hours     01/07/17 1540    azithromycin (ZITHROMAX) 250 MG tablet  Daily     01/07/17 1540       Duffy Bruce, MD 01/08/17 [phone]

## 2017-01-07 NOTE — ED Triage Notes (Signed)
Pt c/o fever and chills since yesterday, general fatigue today

## 2017-01-07 NOTE — ED Notes (Signed)
Patient transported to X-ray 

## 2017-01-08 ENCOUNTER — Encounter: Payer: Self-pay | Admitting: Osteopathic Medicine

## 2017-01-08 ENCOUNTER — Ambulatory Visit (INDEPENDENT_AMBULATORY_CARE_PROVIDER_SITE_OTHER): Payer: Medicare Other | Admitting: Osteopathic Medicine

## 2017-01-08 VITALS — BP 143/88 | HR 59 | Temp 98.1°F | Resp 16 | Wt 155.1 lb

## 2017-01-08 DIAGNOSIS — Z7901 Long term (current) use of anticoagulants: Secondary | ICD-10-CM

## 2017-01-08 DIAGNOSIS — K219 Gastro-esophageal reflux disease without esophagitis: Secondary | ICD-10-CM | POA: Diagnosis not present

## 2017-01-08 DIAGNOSIS — D6861 Antiphospholipid syndrome: Secondary | ICD-10-CM

## 2017-01-08 DIAGNOSIS — E785 Hyperlipidemia, unspecified: Secondary | ICD-10-CM | POA: Diagnosis not present

## 2017-01-08 DIAGNOSIS — E119 Type 2 diabetes mellitus without complications: Secondary | ICD-10-CM

## 2017-01-08 DIAGNOSIS — G40109 Localization-related (focal) (partial) symptomatic epilepsy and epileptic syndromes with simple partial seizures, not intractable, without status epilepticus: Secondary | ICD-10-CM | POA: Diagnosis not present

## 2017-01-08 DIAGNOSIS — N401 Enlarged prostate with lower urinary tract symptoms: Secondary | ICD-10-CM

## 2017-01-08 DIAGNOSIS — K635 Polyp of colon: Secondary | ICD-10-CM | POA: Diagnosis not present

## 2017-01-08 DIAGNOSIS — G40209 Localization-related (focal) (partial) symptomatic epilepsy and epileptic syndromes with complex partial seizures, not intractable, without status epilepticus: Secondary | ICD-10-CM

## 2017-01-08 DIAGNOSIS — J302 Other seasonal allergic rhinitis: Secondary | ICD-10-CM | POA: Insufficient documentation

## 2017-01-08 DIAGNOSIS — Z872 Personal history of diseases of the skin and subcutaneous tissue: Secondary | ICD-10-CM | POA: Insufficient documentation

## 2017-01-08 LAB — COMPLETE METABOLIC PANEL WITH GFR
AG Ratio: 1.5 (calc) (ref 1.0–2.5)
ALBUMIN MSPROF: 4.3 g/dL (ref 3.6–5.1)
ALKALINE PHOSPHATASE (APISO): 41 U/L (ref 40–115)
ALT: 21 U/L (ref 9–46)
AST: 25 U/L (ref 10–35)
BILIRUBIN TOTAL: 0.8 mg/dL (ref 0.2–1.2)
BUN / CREAT RATIO: 14 (calc) (ref 6–22)
BUN: 17 mg/dL (ref 7–25)
CHLORIDE: 106 mmol/L (ref 98–110)
CO2: 28 mmol/L (ref 20–32)
Calcium: 9.9 mg/dL (ref 8.6–10.3)
Creat: 1.25 mg/dL — ABNORMAL HIGH (ref 0.70–1.18)
GFR, EST AFRICAN AMERICAN: 67 mL/min/{1.73_m2} (ref >=60)
GFR, Est Non African American: 58 mL/min/{1.73_m2} — ABNORMAL LOW (ref >=60)
GLUCOSE: 98 mg/dL (ref 65–99)
Globulin: 2.9 g/dL (calc) (ref 1.9–3.7)
POTASSIUM: 4.6 mmol/L (ref 3.5–5.3)
Sodium: 141 mmol/L (ref 135–146)
TOTAL PROTEIN: 7.2 g/dL (ref 6.1–8.1)

## 2017-01-08 LAB — CBC
HCT: 45.4 % (ref 38.5–50.0)
HEMOGLOBIN: 15.4 g/dL (ref 13.2–17.1)
MCH: 30.3 pg (ref 27.0–33.0)
MCHC: 33.9 g/dL (ref 32.0–36.0)
MCV: 89.2 fL (ref 80.0–100.0)
MPV: 10.7 fL (ref 7.5–12.5)
PLATELETS: 176 10*3/uL (ref 140–400)
RBC: 5.09 10*6/uL (ref 4.20–5.80)
RDW: 13.1 % (ref 11.0–15.0)
WBC: 5.5 10*3/uL (ref 3.8–10.8)

## 2017-01-08 LAB — POCT GLYCOSYLATED HEMOGLOBIN (HGB A1C): HEMOGLOBIN A1C: 6.3

## 2017-01-08 LAB — LIPID PANEL
Cholesterol: 119 mg/dL (ref <200)
HDL: 37 mg/dL — ABNORMAL LOW (ref >40)
LDL Cholesterol (Calc): 62 mg/dL (calc)
Non-HDL Cholesterol (Calc): 82 mg/dL (calc) (ref <130)
Total CHOL/HDL Ratio: 3.2 (calc) (ref <5.0)
Triglycerides: 121 mg/dL (ref <150)

## 2017-01-08 NOTE — Telephone Encounter (Signed)
Patient doing much better, was given IV antibiotics at ED and picked up oral antibiotics today. He already had an appointment with PCP today, went to that appointment. He states his temperature has returned to 98.5, feels better and understands to contact office should he have questions or concerns.

## 2017-01-08 NOTE — Progress Notes (Signed)
HPI: Douglas Mercer is a 71 y.o. male who  has a past medical history of Allergic rhinitis, Anti-phospholipid antibody syndrome ([place]), BPH (benign prostatic hypertrophy), Diabetes mellitus, DVT (deep venous thrombosis) ([place]) (02/2009), ED (erectile dysfunction), GERD (gastroesophageal reflux disease), Glaucoma, Hyperlipidemia, Hypertension, Internal hemorrhoids, Pulmonary embolism ([place]) (1992), Rosacea, Tubular adenoma of colon, and Type 2 diabetes mellitus without complication, without long-term current use of insulin ([place]) (07/14/2015).  he presents to [facility] today, 01/08/17,  for chief complaint of:  Chief Complaint  Patient presents with  . New Patient (Initial Visit)    Establish care    New to establish care, recently moved to the area. Fasting today. Needs labs and med refills. Needs diabetes recheck.     CARDIOVASCULAR HTN Hx antiphospholipid Ab Syndrome - DVT's and PE in 1992, bee non blood thinners since, been on Xarelto for 4-5 years per Dr Marin Olp.  HLD Meds: atorvastatin 10, fenofibrate 160, Fish Oil OTC, Xarelto 20 mg daily   RESPIRATORY Never smoker  Allergies Meds: Flonase used sporadically   NEUROLOGICAL Hx seizures twice, couldn't find cause. Has neurology specialist Dr Jaynee Eagles  Meds: Keppra 750 bid  GENITOURINARY: S/p TURP for BPH, following with Dr Mar Daring  Meds: finasteride 5  GASTROINTESTINAL Hx Colon Polyps, recent colonoscopy few days ago, 3 polyps removed, awaiting pathology but looked benign  GERD Meds: Pantoprazole 40 daily   ENDOCRINE DM2 without insulin. Records reviewed, last A1C 6 mos ago was at goal.  Meds: Glipizide XL 5 daily, Metformin 850 bid,   HEENT Dry Eyes and Glaucoma  Meds: Propylene Glycol drops, Latanoprost   SKIN Rosacea Meds: Tetracycline 50 once per week       Past medical, surgical, social and family history reviewed:  Patient Active Problem List   Diagnosis Date Noted  .  Seasonal allergies 01/08/2017  . Polyp of colon 01/08/2017  . History of rosacea 01/08/2017  . Gastroesophageal reflux disease 01/08/2017  . Long term current use of anticoagulant therapy 01/08/2017  . Hypertension   . Simple partial onset seizures followed by impaired consciousness ([place]) 11/19/2015  . Type 2 diabetes mellitus without complication, without long-term current use of insulin ([place]) 07/14/2015  . Anti-phospholipid antibody syndrome (H[facility])   . Personal history of colonic polyps - adenomas 05/12/2011  . Hyperlipidemia 03/08/2006  . Rosacea 03/07/2006  . BPH (benign prostatic hyperplasia) 03/07/2006    Past Surgical History:  Procedure Laterality Date  . COLONOSCOPY  05/07/2006  . colonoscopy with polypectomy  07/07/2002   2 small descending colon polyps  . colonoscopy with polypectomy  04/19/2001   37mm sessile mucosal polyp in distal sigmoid colon  . diverticulosis  07/07/2002,04/19/2001   mild sigmoid  . ESOPHAGOGASTRODUODENOSCOPY    . [place] /2011   right-sided, mesh placed  . PROSTATE SURGERY     18 biopsies.  Benign.  . TONSILLECTOMY    . TRANSURETHRAL RESECTION OF PROSTATE  08/13/09    Social History   Tobacco Use  . Smoking status: Never Smoker  . Smokeless tobacco: Never Used  Substance Use Topics  . Alcohol use: Yes    Alcohol/week: 0.0 oz    Comment: socially    Family History  Problem Relation Age of Onset  . Diabetes Mother   . Melanoma Mother   . COPD Mother   . COPD Father   . Heart attack Maternal Uncle   . Stroke Maternal Aunt        grandparents  .  Colon cancer Paternal Uncle        x2  . Prostate cancer Neg Hx   . Seizures Neg Hx   . Esophageal cancer Neg Hx      Current medication list and allergy/intolerance information reviewed:    Current Outpatient Medications  Medication Sig Dispense Refill  . aspirin 81 MG tablet Take 81 mg by mouth daily.    Marland Kitchen atorvastatin (LIPITOR) 10 MG tablet Take 1 tablet (10 mg  total) by mouth daily. 90 tablet 3  . co-enzyme Q-10 30 MG capsule Take 100 mg by mouth every morning.     . fenofibrate 160 MG tablet TAKE 1 TABLET(160 MG) BY MOUTH DAILY 90 tablet 0  . finasteride (PROSCAR) 5 MG tablet Take 5 mg by mouth daily.     . fish oil-omega-3 fatty acids 1000 MG capsule Take 2 g by mouth daily.      . fluticasone (FLONASE) 50 MCG/ACT nasal spray Place 2 sprays into the nose daily. 48 g 0  . glipiZIDE (GLUCOTROL XL) 5 MG 24 hr tablet TAKE 1 TABLET(5 MG) BY MOUTH DAILY WITH BREAKFAST 90 tablet 0  . latanoprost (XALATAN) 0.005 % ophthalmic solution INT 1 GTT INTO OU HS  2  . levETIRAcetam (KEPPRA) 750 MG tablet Take 1 tablet (750 mg total) by mouth 2 (two) times daily. 180 tablet 4  . metFORMIN (GLUCOPHAGE) 850 MG tablet TAKE 1 TABLET BY MOUTH TWICE DAILY WITH A MEAL 180 tablet 0  . Multiple Vitamin (MULTIVITAMIN) capsule Diabetic health Pack 6-7 pills a day    . pantoprazole (PROTONIX) 40 MG tablet TAKE 1 TABLET(40 MG) BY MOUTH DAILY BEFORE BREAKFAST 90 tablet 0  . Propylene Glycol (SYSTANE BALANCE) 0.6 % SOLN Place 1 drop into both eyes 2 (two) times daily.    Alveda Reasons 20 MG TABS tablet TAKE 1 TABLET BY MOUTH DAILY AT 12:00 NOON 90 tablet 0  . amoxicillin-clavulanate (AUGMENTIN) 875-125 MG tablet Take 1 tablet every 12 (twelve) hours for 7 days by mouth. (Patient not taking: Reported on 01/08/2017) 14 tablet 0  . azithromycin (ZITHROMAX) 250 MG tablet Take 1 tablet (250 mg total) daily by mouth. Take first 2 tablets together, then 1 every day until finished. (Patient not taking: Reported on 01/08/2017) 6 tablet 0  . tetracycline (ACHROMYCIN,SUMYCIN) 500 MG capsule Take 1 capsule by mouth weekly as needed for roscasea  4   No current facility-administered medications for this visit.     No Known Allergies    Review of Systems:  Constitutional:  No  fever, no chills, No recent illness, No unintentional weight changes. No significant fatigue.   HEENT: No  headache,  no vision change  Cardiac: No  chest pain, No  pressure, No palpitations, No  Orthopnea  Respiratory:  No  shortness of breath. No  Cough  Gastrointestinal: No  abdominal pain, No  nausea, No  vomiting,  No  blood in stool, No  diarrhea, No  constipation   Musculoskeletal: No new myalgia/arthralgia  Genitourinary: No  incontinence, No  abnormal genital bleeding, No abnormal genital discharge  Skin: No  Rash, No other wounds/concerning lesions  Hem/Onc: No  easy bruising/bleeding, No  abnormal lymph node  Endocrine: No cold intolerance,  No heat intolerance. No polyuria/polydipsia/polyphagia   Neurologic: No  weakness, No  dizziness, No  slurred speech/focal weakness/facial droop  Psychiatric: No  concerns with depression, No  concerns with anxiety, No sleep problems, No mood problems  Exam:  BP (!) 143/88 (  BP Location: Left Arm, Patient Position: Sitting, Cuff Size: Large)   Pulse (!) 59   Temp 98.1 F (36.7 C) (Oral)   Resp 16   Wt 155 lb 1.6 oz (70.4 kg)   BMI 23.58 kg/m    Constitutional: VS see above. General Appearance: alert, well-developed, well-nourished, NAD  Eyes: Normal lids and conjunctive, non-icteric sclera  Ears, Nose, Mouth, Throat: MMM, Normal external inspection ears/nares/mouth/lips/gums. TM normal bilaterally. Pharynx/tonsils no erythema, no exudate. Nasal mucosa normal.   Neck: No masses, trachea midline. No thyroid enlargement. No tenderness/mass appreciated. No lymphadenopathy  Respiratory: Normal respiratory effort. no wheeze, no rhonchi, no rales  Cardiovascular: S1/S2 normal, no murmur, no rub/gallop auscultated. RRR. No lower extremity edema. Pedal pulse II/IV bilaterally DP and PT. No carotid bruit or JVD. No abdominal aortic bruit.  Gastrointestinal: Nontender, no masses. No hepatomegaly, no splenomegaly. No hernia appreciated. Bowel sounds normal. Rectal exam deferred.   Musculoskeletal: Gait normal. No clubbing/cyanosis of digits.    Neurological: Normal balance/coordination. No tremor. No cranial nerve deficit on limited exam. Motor and sensation intact and symmetric. Cerebellar reflexes intact.   Skin: warm, dry, intact. No rash/ulcer. No concerning nevi or subq nodules on limited exam.    Psychiatric: Normal judgment/insight. Normal mood and affect. Oriented x3.      Dg Chest 2 View  Result Date: 01/07/2017 CLINICAL DATA:  colonoscopy 3 days ago (reviewed). No problems until last night when he developed a fever to 102. Described as lethargic. No GI complaintsDenies chest pain or abdominal pain, hx DVT and PE on therapeutic blood thinners Hx TURP, inguinal hernia repair,DM EXAM: CHEST - 2 VIEW COMPARISON:  08/02/2009 FINDINGS: Some linear opacities in the inferior lingula and posterior right lower lobe, new since prior study. Attenuated bronchovascular markings in the apices. Heart size and mediastinal contours are within normal limits. Chronic blunting of posterior costophrenic angles.  No pneumothorax. Visualized bones unremarkable. IMPRESSION: Linear opacities in the posterior right lower lobe and inferior lingula may represent early interstitial infiltrate versus interval scarring or atelectasis. Electronically Signed   By: Lucrezia Europe M.D.   On: 01/07/2017 13:36   Ct Angio Chest Pe W And/or Wo Contrast  Result Date: 01/07/2017 CLINICAL DATA:  Shortness of breath after colonoscopy 3 days ago. EXAM: CT ANGIOGRAPHY CHEST WITH CONTRAST TECHNIQUE: Multidetector CT imaging of the chest was performed using the standard protocol during bolus administration of intravenous contrast. Multiplanar CT image reconstructions and MIPs were obtained to evaluate the vascular anatomy. CONTRAST:  142mL ISOVUE-370 IOPAMIDOL (ISOVUE-370) INJECTION 76% COMPARISON:  None. FINDINGS: Cardiovascular: Heart size normal. No pericardial effusion. Coronary artery calcification is evident. No thoracic aortic aneurysm. No filling defect within the  opacified pulmonary arteries to suggest the presence of an acute pulmonary embolus. Mediastinum/Nodes: No mediastinal lymphadenopathy. There is no hilar lymphadenopathy. The esophagus has normal imaging features. There is no axillary lymphadenopathy. Lungs/Pleura: Subsegmental atelectasis noted both lung bases. There is an area in the left lower lobe (image 53 series 6) The may be atelectatic but superimposed pneumonia could have this appearance. No pleural effusion. 6 mm right lower lobe pulmonary nodule is seen on image 60 series 6. Upper Abdomen: Calcified gallstones. Musculoskeletal: Bone windows reveal no worrisome lytic or sclerotic osseous lesions. Review of the MIP images confirms the above findings. IMPRESSION: 1. Compressive atelectasis both lower lobes with potential pneumonia left lower lobe. 2. 6 mm right lower lobe pulmonary nodule. Non-contrast chest CT at 6-12 months is recommended. If the nodule is stable at  time of repeat CT, then future CT at 18-24 months (from today's scan) is considered optional for low-risk patients, but is recommended for high-risk patients. This recommendation follows the consensus statement: Guidelines for Management of Incidental Pulmonary Nodules Detected on CT Images: From the Fleischner Society 2017; Radiology 2017; 284:228-243. 3. Cholelithiasis. Electronically Signed   By: Misty Stanley M.D.   On: 01/07/2017 14:51   US Venous Img Lower Bilateral  Result Date: 01/07/2017 CLINICAL DATA:  Fever x1 day. Bilateral leg swelling. History of DVT and PE. Diabetes. EXAM: BILATERAL LOWER EXTREMITY VENOUS DOPPLER ULTRASOUND TECHNIQUE: Gray-scale sonography with compression, as well as color and duplex ultrasound, were performed to evaluate the deep venous system from the level of the common femoral vein through the popliteal and proximal calf veins. COMPARISON:  None FINDINGS: On the right, linear nonocclusive filling defects in the common femoral vein. This results in  incomplete compressibility although there is good color flow signal around this lesion. Deep femoral vein is normal. Linear filling defects in the distal femoral vein resulting in incomplete compressibility. Popliteal vein and calf veins are normal. Visualized segments of the saphenous venous system normal in caliber and compressibility. On the left, incomplete possibility of ectopic common femoral vein with some eccentric mural thrombus, incompletely occlusive. Eccentric mural thrombus is also seen in the deep femoral vein, incompletely occlusive. Femoral vein is unremarkable. Linear eccentric thrombus in the popliteal vein, incompletely occlusive, resulting in incomplete compressibility. Calf veins are unremarkable. IMPRESSION: 1. Nonocclusive, linear, possibly chronic DVT in the right common femoral and femoral veins, and on the left in the common femoral, deep femoral and popliteal veins. Electronically Signed   By: Lucrezia Europe M.D.   On: 01/07/2017 13:33   Dg Abd 2 Views  Result Date: 01/07/2017 CLINICAL DATA:  colonoscopy 3 days ago (reviewed). No problems until last night when he developed a fever to 102. Described as lethargic. No GI complaints Denies chest pain or abdominal pain, hx DVT and PE on therapeutic blood thinners Hx TURP, inguinal hernia repair,DM EXAM: ABDOMEN - 2 VIEW COMPARISON:  CT 10/19/2011 FINDINGS: Peripherally calcified gallstones in the right upper abdomen. No free air. Normal bowel gas pattern. Regional bones unremarkable. IMPRESSION: Cholelithiasis Normal bowel gas pattern. Electronically Signed   By: Lucrezia Europe M.D.   On: 01/07/2017 13:38        ASSESSMENT/PLAN: Very pleasant patient here to establish care. Most chronic conditions seem under fairly good control. Records reviewed from recent ER visit, no major concerns.  Type 2 diabetes mellitus without complication, without long-term current use of insulin ([place]) - Plan: POCT glycosylated hemoglobin (Hb A1C), CBC, COMPLETE  METABOLIC PANEL WITH GFR, Lipid panel  Anti-phospholipid antibody syndrome (HCC)  Hyperlipidemia, unspecified hyperlipidemia type  Benign prostatic hyperplasia with lower urinary tract symptoms, symptom details unspecified  Simple partial onset seizures followed by impaired consciousness ([place])  Long term current use of anticoagulant therapy  Gastroesophageal reflux disease, esophagitis presence not specified  History of rosacea  Polyp of colon, unspecified part of colon, unspecified type  Seasonal allergies      Visit summary with medication list and pertinent instructions was printed for patient to review. All questions at time of visit were answered - patient instructed to contact office with any additional concerns. ER/RTC precautions were reviewed with the patient. Follow-up plan: Return in about 6 months (around 07/08/2017) for [place], sooner if needed (+/- BP check) .  Note: Total time spent 45 minutes, greater than 50% of the  visit was spent face-to-face counseling and coordinating care for the following: The primary encounter diagnosis was Type 2 diabetes mellitus without complication, without long-term current use of insulin ([place]). Diagnoses of Anti-phospholipid antibody syndrome ([place]), Hyperlipidemia, unspecified hyperlipidemia type, Benign prostatic hyperplasia with lower urinary tract symptoms, symptom details unspecified, Simple partial onset seizures followed by impaired consciousness ([place]), Long term current use of anticoagulant therapy, Gastroesophageal reflux disease, esophagitis presence not specified, History of rosacea, Polyp of colon, unspecified part of colon, unspecified type, and Seasonal allergies were also pertinent to this visit.Marland Kitchen  Please note: voice recognition software was used to produce this document, and typos may escape review. Please contact Dr. Sheppard Coil for any needed clarifications.

## 2017-01-08 NOTE — Telephone Encounter (Signed)
Please call patient and get a f/u on how he is doing and if any ?'s for me  Thanks

## 2017-01-09 ENCOUNTER — Encounter: Payer: Self-pay | Admitting: Internal Medicine

## 2017-01-09 NOTE — Progress Notes (Signed)
3 adenomas recall colon 2021

## 2017-01-10 ENCOUNTER — Telehealth: Payer: Self-pay | Admitting: *Deleted

## 2017-01-10 MED ORDER — FENOFIBRATE 160 MG PO TABS: 160.0000 mg | ORAL_TABLET | Freq: Every day | ORAL | 3 refills | Status: DC

## 2017-01-10 MED ORDER — ATORVASTATIN CALCIUM 10 MG PO TABS: 10.0000 mg | ORAL_TABLET | Freq: Every day | ORAL | 3 refills | Status: DC

## 2017-01-10 NOTE — Telephone Encounter (Signed)
Sent Rx 

## 2017-01-10 NOTE — Telephone Encounter (Signed)
Patient is requesting atorvastatin and fenofibrate refills. I started to send this but they have not been presc

## 2017-01-15 ENCOUNTER — Ambulatory Visit: Payer: Medicare Other | Admitting: Osteopathic Medicine

## 2017-01-16 ENCOUNTER — Ambulatory Visit: Payer: Medicare Other | Admitting: Family Medicine

## 2017-01-16 ENCOUNTER — Ambulatory Visit: Payer: Medicare Other | Admitting: Osteopathic Medicine

## 2017-01-17 ENCOUNTER — Ambulatory Visit: Payer: Medicare Other | Admitting: Family Medicine

## 2017-02-14 ENCOUNTER — Other Ambulatory Visit: Payer: Self-pay | Admitting: Family Medicine

## 2017-02-16 ENCOUNTER — Encounter: Payer: Self-pay | Admitting: Osteopathic Medicine

## 2017-02-16 ENCOUNTER — Other Ambulatory Visit: Payer: Self-pay | Admitting: Osteopathic Medicine

## 2017-02-19 MED ORDER — GLIPIZIDE ER 5 MG PO TB24: ORAL_TABLET | ORAL | 1 refills | Status: DC

## 2017-02-28 ENCOUNTER — Encounter: Payer: Self-pay | Admitting: Osteopathic Medicine

## 2017-03-06 ENCOUNTER — Other Ambulatory Visit: Payer: Self-pay

## 2017-03-06 MED ORDER — METFORMIN HCL 850 MG PO TABS: ORAL_TABLET | ORAL | 0 refills | Status: DC

## 2017-03-06 MED ORDER — RIVAROXABAN 20 MG PO TABS: ORAL_TABLET | ORAL | 0 refills | Status: DC

## 2017-03-06 MED ORDER — PANTOPRAZOLE SODIUM 40 MG PO TBEC: DELAYED_RELEASE_TABLET | ORAL | 0 refills | Status: DC

## 2017-05-04 ENCOUNTER — Other Ambulatory Visit: Payer: Self-pay

## 2017-05-04 MED ORDER — FENOFIBRATE 160 MG PO TABS: 160.0000 mg | ORAL_TABLET | Freq: Every day | ORAL | 1 refills | Status: DC

## 2017-05-04 MED ORDER — GLIPIZIDE ER 5 MG PO TB24: ORAL_TABLET | ORAL | 1 refills | Status: DC

## 2017-05-07 ENCOUNTER — Other Ambulatory Visit: Payer: Self-pay | Admitting: *Deleted

## 2017-05-08 ENCOUNTER — Other Ambulatory Visit: Payer: Self-pay | Admitting: *Deleted

## 2017-05-08 MED ORDER — LEVETIRACETAM 750 MG PO TABS: 750.0000 mg | ORAL_TABLET | Freq: Two times a day (BID) | ORAL | 1 refills | Status: DC

## 2017-05-08 NOTE — Telephone Encounter (Signed)
Received refill request for keppra with no dosage details. Called and confirmed he is still taking Levetiracetam 750 mg BID. He stated his new pharmacy is now [facility]. He has an upcoming OV with Dr. Jaynee Eagles on 06/18/17 @ 11:00 arrival time 10:30. He confirmed.   Refill e-scribed for 6 months.

## 2017-06-04 ENCOUNTER — Other Ambulatory Visit: Payer: Self-pay | Admitting: Osteopathic Medicine

## 2017-06-18 ENCOUNTER — Encounter: Payer: Self-pay | Admitting: Neurology

## 2017-06-18 ENCOUNTER — Ambulatory Visit (INDEPENDENT_AMBULATORY_CARE_PROVIDER_SITE_OTHER): Payer: Medicare Other | Admitting: Neurology

## 2017-06-18 VITALS — BP 151/74 | HR 86 | Ht 69.0 in | Wt 162.0 lb

## 2017-06-18 DIAGNOSIS — R569 Unspecified convulsions: Secondary | ICD-10-CM | POA: Diagnosis not present

## 2017-06-18 MED ORDER — LEVETIRACETAM 750 MG PO TABS: 750.0000 mg | ORAL_TABLET | Freq: Two times a day (BID) | ORAL | 4 refills | Status: DC

## 2017-06-18 NOTE — Progress Notes (Signed)
[facility]    Provider:  Dr Jaynee Eagles Referring Provider: Coral Spikes, DO Primary Care Physician:  Emeterio Reeve, DO  CC: Syncopal episode  Interval history: He is stable. Ask Dr. Sheppard Coil to refill Keppra since so stable. Discussed seizures, recommend staying on medication for life. He has no side effects.   Interval history: Dong well. No seizures. No side effects to the medication. He is driving now again. No side effects from the medication. We discussed patient's situation and he should likely stay on Keppra for a minimum of several years if not indefinitely. Also discussed seizure precautions.  Interval history: Patient is doing well. Wife is driving. He can drive in December if no events. No events. No side effects of the medication. Discussed the EEG below. Discussed seizures. Patient doing well. Discussed EEG. Discussed Keppra and seizure management.   CONCLUSION: This is a mild abnormal awake and asleep EEG. There is no electrodiagnostic evidence of epileptiform discharge, there is evidence of intermittent left frontal slowing, suggestive of focal irritability.  LPF:XTKWI [name] a 72 y.o.malehere as a referral from Dr. Revonda Humphrey syncopal episode. Past medical history diabetes and high cholesterol. He has had 2 episodes, first one on mother's day he had an episode, second event this past Sunday when he fell backwards and hit his head on he concrete floor. Per patient he does not remember anything, symptoms and all of a sudden he woke up on the floor. Wife is here and provides information: Both events happened at church. Per his wife, his left side started jerking, his eyes rolled back, he didn't respond for 30 seconds, she called the EMS on the first episode. No one saw the second episode start but found him slumped over. The second time, his eyes were rolled into his head, scared her to death, looked dead, could see the whites of his eyes. He had a  tonic posture. Confusion afterwards. There were 2 nurses in attendance who both said looked like a seizure, duration less than a minute, he had hit his head on the concrete floor, no fracture. He was hospitalized at [facility]. No urination, no tongue biting. No previous history of seizures, he sometimes has uncontrollable shrugs and jerks. No personal history or family history of seizures. He has a history of meningitis. No inciting events or head trauma. No known triggering events. Nothing makes the symptoms worse or better. No other associated symptoms. No other focal neurologic deficits or complaints per patient and wife.  Reviewed notes, labs and imaging from outside physicians, which showed:  Patient has antiphospholipid syndrome, BPH, hyperlipidemia, diabetes and was seen in outpatient for history of syncope. Patient stated that on Mother's Day he was at church and became unresponsive, he has some chronic activity of his left arm and became unresponsive, lasting briefly, he was evaluated by EMS and was not taken in for evaluation. On 625 he was at church, he says he was feeling fine, no prior palpitations or chest pain, he fell directly back hitting the back of his head, his eyes rolled back and he was unresponsive for approximately 2 minutes, no tongue injury incontinence, postictal confusion for a few minutes. He was transported to the local hospital and was evaluated. Review of records shows no other syncopal episodes reported in the past.  Ultrasonic carotid arteries with color Doppler showed mild right carotid bulb plaque. Bilateral stenosis of less than 50%. Date 08/16/2015.  MRI of the brain without contrast showed no acute intracranial pathology. No  extra-axial fluid collection. No major territorial vascular infarctions. Ventricles, sulci and other CSF spaces demonstrates moderate cerebral and cerebellar parenchymal volume loss. Mild scattered. Ventricular and subcortical white matter  T2 FLAIR hyperintensities are nonspecific findings likely related to sequelae of chronic small vessel ischemic changes. No evidence of mass, mass effect or midline shift. Fourth ventricle is midline. Major intracranial vascular flow voids appear preserved. Orbits are grossly intact. Performed 08/16/2015.  Echocardiogram showed grossly normal left ventricular size and systolic function, ejection fraction 50-60%. Impaired relaxation of diastolic filling pattern, trace amount of aortic regurgitation, trace mitral regurgitation, right ventricular systolic pressure as measured by Dopplers 21.54 mmHg.  Personally reviewed EKG tracing which showed normal sinus rhythm, normal axis, no ST or T-wave changes, first degree AV block.  CBC normal, CMP unremarkable with normal creatinine 1.06, elevated glucose 132.  Review of Systems: Patient complains of symptoms per HPI as well as the following symptoms: Snoring, hearing loss, urination problems, blood in urine, passing out, snoring. Pertinent negatives per HPI. All others negative.    Social History   Socioeconomic History  . Marital status: Married    Spouse name: Judson Roch  . Number of children: 0  . Years of education: 82  . Highest education level: Not on file  Occupational History  . Occupation: Physiological scientist)    Employer: OTHER    Comment: Retired  Scientific laboratory technician  . Financial resource strain: Not on file  . Food insecurity:    Worry: Not on file    Inability: Not on file  . Transportation needs:    Medical: Not on file    Non-medical: Not on file  Tobacco Use  . Smoking status: Never Smoker  . Smokeless tobacco: Never Used  Substance and Sexual Activity  . Alcohol use: Yes    Alcohol/week: 0.0 oz    Comment: socially  . Drug use: No  . Sexual activity: Not Currently  Lifestyle  . Physical activity:    Days per week: Not on file    Minutes per session: Not on file  . Stress: Not on file  Relationships  .  Social connections:    Talks on phone: Not on file    Gets together: Not on file    Attends religious service: Not on file    Active member of club or organization: Not on file    Attends meetings of clubs or organizations: Not on file    Relationship status: Not on file  . Intimate partner violence:    Fear of current or ex partner: Not on file    Emotionally abused: Not on file    Physically abused: Not on file    Forced sexual activity: Not on file  Other Topics Concern  . 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 Diabetes Mother   . Melanoma Mother   . COPD Mother   . COPD Father   . Heart attack Maternal Uncle   . Stroke Maternal Aunt        grandparents  . Colon cancer Paternal Uncle  x2  . Prostate cancer Neg Hx   . Seizures Neg Hx   . Esophageal cancer Neg Hx     Past Medical History:  Diagnosis Date  . Allergic rhinitis   . Anti-phospholipid antibody syndrome (HCC)   . BPH (benign prostatic hypertrophy)   . Diabetes mellitus    type 2  . DVT (deep venous thrombosis) ([place]) 02/2009   large left lower  . ED (erectile dysfunction)   . GERD (gastroesophageal reflux disease)   . Glaucoma   . Hyperlipidemia   . Hypertension   . Internal hemorrhoids   . Pulmonary embolism ([place]) 1992   bilateral w/ PE reportedly (+) lupus anticoagulant  . Rosacea   . Tubular adenoma of colon   . Type 2 diabetes mellitus without complication, without long-term current use of insulin ([place]) 07/14/2015    Past Surgical History:  Procedure Laterality Date  . COLONOSCOPY  05/07/2006  . colonoscopy with polypectomy  07/07/2002   2 small descending colon polyps   . colonoscopy with polypectomy  04/19/2001   63mm sessile mucosal polyp in distal sigmoid colon  . diverticulosis  07/07/2002,04/19/2001   mild sigmoid  . ESOPHAGOGASTRODUODENOSCOPY    . [place] /2011   right-sided, mesh placed  . PROSTATE SURGERY     18 biopsies.  Benign.  . TONSILLECTOMY    . TRANSURETHRAL RESECTION OF PROSTATE  08/13/09    Current Outpatient Medications  Medication Sig Dispense Refill  . aspirin 81 MG tablet Take 81 mg by mouth daily.    Marland Kitchen atorvastatin (LIPITOR) 10 MG tablet Take 1 tablet (10 mg total) by mouth daily. 90 tablet 3  . co-enzyme Q-10 30 MG capsule Take 100 mg by mouth every morning.     . fenofibrate 160 MG tablet Take 1 tablet (160 mg total) by mouth daily. 90 tablet 1  . finasteride (PROSCAR) 5 MG tablet Take 5 mg by mouth daily.     . fish oil-omega-3 fatty acids 1000 MG capsule Take 2 g by mouth daily.      Marland Kitchen FLUAD 0.5 ML SUSY Inject 0.5 mLs as directed into the muscle.  0  . fluticasone (FLONASE) 50 MCG/ACT nasal spray Place 2 sprays into the nose daily. 48 g 0  . glipiZIDE (GLUCOTROL XL) 5 MG 24 hr tablet TAKE 1 TABLET(5 MG) BY MOUTH DAILY WITH BREAKFAST 90 tablet 1  . latanoprost (XALATAN) 0.005 % ophthalmic solution INT 1 GTT INTO OU HS  2  . levETIRAcetam (KEPPRA) 750 MG tablet Take 1 tablet (750 mg total) by mouth 2 (two) times daily. 180 tablet 1  . metFORMIN (GLUCOPHAGE) 850 MG tablet TAKE 1 TABLET TWICE A DAY  WITH A MEAL 180 tablet 0  . Multiple Vitamin (MULTIVITAMIN) capsule Diabetic health Pack 6-7 pills a day    . pantoprazole (PROTONIX) 40 MG tablet TAKE 1 TABLET(40 MG) BY MOUTH DAILY BEFORE BREAKFAST 90 tablet 0  . Propylene Glycol (SYSTANE BALANCE) 0.6 % SOLN Place 1 drop into both eyes 2 (two) times daily.    Alveda Reasons 20 MG TABS tablet TAKE 1 TABLET DAILY AT     12:00 NOON 90 tablet 0   No current facility-administered medications for this visit.     Allergies as of 06/18/2017  . (No Known Allergies)     Vitals: There were no vitals taken for this visit. Last Weight:  Wt Readings from Last 1 Encounters:  01/08/17 155 lb 1.6 oz (70.4 kg)  Last Height:   Ht Readings from Last 1 Encounters:  01/07/17 5\' 8"  (1.727 m)     Physical exam: Exam: Gen: NAD, conversant, well nourised, well groomed  CV: RRR, no MRG. No Carotid Bruits. No peripheral edema, warm, nontender Eyes: Conjunctivae clear without exudates or hemorrhage  Neuro: Detailed Neurologic Exam  Speech: Speech is normal; fluent and spontaneous with normal comprehension.  Cognition: The patient is oriented to person, place, and time;  recent and remote memory intact;  language fluent;  normal attention, concentration,  fund of knowledge Cranial Nerves: The pupils are equal, round, and reactive to light. Attempted funduscopic exam could not visualize due to small pupils. Visual fields are full to finger confrontation. Extraocular movements are intact. Trigeminal sensation is intact and the muscles of mastication are normal. The face is symmetric. The palate elevates in the midline. Hearing intact. Voice is normal. Shoulder shrug is normal. The tongue has normal motion without fasciculations.   Coordination: Normal finger to nose and heel to shin. Normal rapid alternating movements.   Gait: Heel-toe and tandem gait are normal.   Motor Observation: No asymmetry, no atrophy, and no involuntary movements noted. Tone: Normal muscle tone.   Posture: Posture is normal. normal erect  Strength: Strength is V/V in the upper and lower limbs.   Sensation: intact to LT  Reflex Exam:  DTR's: Deep tendon reflexes in the upper and lower extremities are normal bilaterally.  Toes: The toes are downgoing bilaterally.  Clonus: Clonus is absent.    Assessment/Plan:72 year old male with likely partial onset seizures, seizures started  with left arm jerking, eyes rolled back, patient loses consciousness with tonic posturing, postictal confusion. Doing well on Keppra 750mg  twice daily, advise to stay on it for life.   We'll continue Keppra 750 mg twice daily.   Routine EEG was abnormal  Will ask Dr Sheppard Coil to refill his keppra  Patient is unable to drive, operate heavy machinery, perform activities at heights or participate in water activities until 6 months seizure free. He has been 6 months since December 2017.  Discussed Patients with epilepsy have a small risk of sudden unexpected death, a condition referred to as sudden unexpected death in epilepsy (SUDEP). SUDEP is defined specifically as the sudden, unexpected, witnessed or unwitnessed, nontraumatic and nondrowning death in patients with epilepsy with or without evidence for a seizure, and excluding documented status epilepticus, in which post mortem examination does not reveal a structural or toxicologic cause for death   F/u with pcp who can refill medication.  Call if there are any other events so we can adjust medication and see him back in clinic.  Sarina Ill, MD  [facility] [address] [place], [place] [address]  Phone [phone] Fax [phone]  A total of 15 minutes was spent face-to-face with this patient. Over half this time was spent on counseling patient on the seizure diagnosis and different diagnostic and therapeutic options available.

## 2017-06-28 ENCOUNTER — Other Ambulatory Visit: Payer: Self-pay

## 2017-06-28 MED ORDER — ATORVASTATIN CALCIUM 10 MG PO TABS: 10.0000 mg | ORAL_TABLET | Freq: Every day | ORAL | 0 refills | Status: DC

## 2017-07-09 ENCOUNTER — Ambulatory Visit (INDEPENDENT_AMBULATORY_CARE_PROVIDER_SITE_OTHER): Payer: Medicare Other | Admitting: Osteopathic Medicine

## 2017-07-09 ENCOUNTER — Telehealth: Payer: Self-pay | Admitting: Osteopathic Medicine

## 2017-07-09 ENCOUNTER — Encounter: Payer: Self-pay | Admitting: Osteopathic Medicine

## 2017-07-09 VITALS — BP 143/88 | HR 84 | Temp 98.1°F | Wt 157.9 lb

## 2017-07-09 DIAGNOSIS — G40209 Localization-related (focal) (partial) symptomatic epilepsy and epileptic syndromes with complex partial seizures, not intractable, without status epilepticus: Secondary | ICD-10-CM

## 2017-07-09 DIAGNOSIS — K219 Gastro-esophageal reflux disease without esophagitis: Secondary | ICD-10-CM

## 2017-07-09 DIAGNOSIS — K635 Polyp of colon: Secondary | ICD-10-CM | POA: Diagnosis not present

## 2017-07-09 DIAGNOSIS — Z7901 Long term (current) use of anticoagulants: Secondary | ICD-10-CM | POA: Diagnosis not present

## 2017-07-09 DIAGNOSIS — E785 Hyperlipidemia, unspecified: Secondary | ICD-10-CM | POA: Diagnosis not present

## 2017-07-09 DIAGNOSIS — G40109 Localization-related (focal) (partial) symptomatic epilepsy and epileptic syndromes with simple partial seizures, not intractable, without status epilepticus: Secondary | ICD-10-CM

## 2017-07-09 DIAGNOSIS — I10 Essential (primary) hypertension: Secondary | ICD-10-CM

## 2017-07-09 DIAGNOSIS — E119 Type 2 diabetes mellitus without complications: Secondary | ICD-10-CM

## 2017-07-09 LAB — POCT GLYCOSYLATED HEMOGLOBIN (HGB A1C): HEMOGLOBIN A1C: 6

## 2017-07-09 MED ORDER — FENOFIBRATE 160 MG PO TABS: 160.0000 mg | ORAL_TABLET | Freq: Every day | ORAL | 3 refills | Status: DC

## 2017-07-09 MED ORDER — METFORMIN HCL 850 MG PO TABS: 850.0000 mg | ORAL_TABLET | Freq: Two times a day (BID) | ORAL | 3 refills | Status: DC

## 2017-07-09 MED ORDER — FINASTERIDE 5 MG PO TABS: 5.0000 mg | ORAL_TABLET | Freq: Every day | ORAL | 3 refills | Status: DC

## 2017-07-09 MED ORDER — PANTOPRAZOLE SODIUM 40 MG PO TBEC: 40.0000 mg | DELAYED_RELEASE_TABLET | Freq: Every day | ORAL | 3 refills | Status: DC

## 2017-07-09 MED ORDER — GLIPIZIDE ER 5 MG PO TB24: ORAL_TABLET | ORAL | 3 refills | Status: DC

## 2017-07-09 MED ORDER — ATORVASTATIN CALCIUM 10 MG PO TABS: 10.0000 mg | ORAL_TABLET | Freq: Every day | ORAL | 0 refills | Status: DC

## 2017-07-09 MED ORDER — ASPIRIN 81 MG PO TABS: 81.0000 mg | ORAL_TABLET | Freq: Every day | ORAL | 3 refills | Status: DC

## 2017-07-09 MED ORDER — RIVAROXABAN 20 MG PO TABS
20.0000 mg | ORAL_TABLET | Freq: Every day | ORAL | 3 refills | Status: DC
Start: 2017-07-09 — End: 2017-10-10

## 2017-07-09 NOTE — Progress Notes (Signed)
HPI: Douglas Mercer is a 72 y.o. male  who presents to [facility] today, 07/09/17,  for Medicare Annual Wellness Exam  Patient presents for annual physical/Medicare wellness exam. No complaints today.   Type 2 diabetes mellitus without complication - O2V today shows good control.    Hyperlipidemia - compliant with diet/exercise and meds  Hx clots - antiphospholipid Ab syndrome, on Xarelto  HTN - BP a bit above goal on intake, recheck was improved  GERD - no concerns.   Simple partial onset seizures - following with neuro   Hx colon polyp - 12/2016 scope, need repeat in 2021.     Past medical, surgical, social and family history reviewed:  Patient Active Problem List   Diagnosis Date Noted  . Seasonal allergies 01/08/2017  . Polyp of colon 01/08/2017  . History of rosacea 01/08/2017  . Gastroesophageal reflux disease 01/08/2017  . Long term current use of anticoagulant therapy 01/08/2017  . Hypertension   . Simple partial onset seizures followed by impaired consciousness ([place]) 11/19/2015  . Type 2 diabetes mellitus without complication, without long-term current use of insulin ([place]) 07/14/2015  . Anti-phospholipid antibody syndrome ([facility])   . Personal history of colonic polyps - adenomas 05/12/2011  . Hyperlipidemia 03/08/2006  . Rosacea 03/07/2006  . BPH (benign prostatic hyperplasia) 03/07/2006    Past Surgical History:  Procedure Laterality Date  . ADENOIDECTOMY  1959  . CATARACT EXTRACTION, BILATERAL     L eye on 04/24/17 and R eye on 05/08/17.  Marland Kitchen COLONOSCOPY  05/07/2006  . colonoscopy with polypectomy  07/07/2002   2 small descending colon polyps  . colonoscopy with polypectomy  04/19/2001   73mm sessile mucosal polyp in distal sigmoid colon  . diverticulosis  07/07/2002,04/19/2001   mild sigmoid  . ESOPHAGOGASTRODUODENOSCOPY    . HERNIA REPAIR     1995 and 08/13/2009  . [place] /2011   right-sided, mesh  placed  . PROSTATE SURGERY     18 biopsies.  Benign.  . TONSILLECTOMY  1959  . TRANSURETHRAL RESECTION OF PROSTATE  08/13/09  . TRANSURETHRAL RESECTION OF PROSTATE  08/13/2009    Social History   Socioeconomic History  . Marital status: Married    Spouse name: Judson Roch  . Number of children: 0  . Years of education: 37  . Highest education level: Not on file  Occupational History  . Occupation: Physiological scientist)    Employer: OTHER    Comment: Retired  Scientific laboratory technician  . Financial resource strain: Not on file  . Food insecurity:    Worry: Not on file    Inability: Not on file  . Transportation needs:    Medical: Not on file    Non-medical: Not on file  Tobacco Use  . Smoking status: Never Smoker  . Smokeless tobacco: Never Used  Substance and Sexual Activity  . Alcohol use: Yes    Alcohol/week: 0.0 oz    Comment: socially  . Drug use: No  . Sexual activity: Not Currently  Lifestyle  . Physical activity:    Days per week: Not on file    Minutes per session: Not on file  . Stress: Not on file  Relationships  . Social connections:    Talks on phone: Not on file    Gets together: Not on file    Attends religious service: Not on file    Active member of club or organization: Not on file    Attends  meetings of clubs or organizations: Not on file    Relationship status: Not on file  . Intimate partner violence:    Fear of current or ex partner: Not on file    Emotionally abused: Not on file    Physically abused: Not on file    Forced sexual activity: Not on file  Other Topics Concern  . 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  Diabetes Mother   . Melanoma Mother   . COPD Mother   . COPD Father   . Heart attack Maternal Uncle   . Stroke Maternal Aunt        grandparents  . Colon cancer Paternal Uncle        x2  . Prostate cancer Neg Hx   . Seizures Neg Hx   . Esophageal cancer Neg Hx      Current medication list and allergy/intolerance information reviewed:    Outpatient Encounter Medications as of 07/09/2017  Medication Sig Note  . aspirin 81 MG tablet Take 81 mg by mouth daily.   Marland Kitchen atorvastatin (LIPITOR) 10 MG tablet Take 1 tablet (10 mg total) by mouth daily. Pt must keep appt on 07/09/17 for further refills.   Marland Kitchen Co-Enzyme Q-10 100 MG CAPS Take 100 mg by mouth daily.   . fenofibrate 160 MG tablet Take 1 tablet (160 mg total) by mouth daily.   . finasteride (PROSCAR) 5 MG tablet Take 5 mg by mouth daily.    . fish oil-omega-3 fatty acids 1000 MG capsule Take 2 g by mouth daily.     . fluticasone (FLONASE) 50 MCG/ACT nasal spray Place 2 sprays into the nose daily. 11/06/2016: On hand for allergy season  . glipiZIDE (GLUCOTROL XL) 5 MG 24 hr tablet TAKE 1 TABLET(5 MG) BY MOUTH DAILY WITH BREAKFAST   . latanoprost (XALATAN) 0.005 % ophthalmic solution INT 1 GTT INTO OU HS   . levETIRAcetam (KEPPRA) 750 MG tablet Take 1 tablet (750 mg total) by mouth 2 (two) times daily.   . metFORMIN (GLUCOPHAGE) 850 MG tablet TAKE 1 TABLET TWICE A DAY  WITH A MEAL   . Multiple Vitamin (MULTIVITAMIN) capsule Diabetic health Pack 6-7 pills a day   . pantoprazole (PROTONIX) 40 MG tablet TAKE 1 TABLET(40 MG) BY MOUTH DAILY BEFORE BREAKFAST   . PROLENSA 0.07 % SOLN Place 1 drop into the right eye daily. Continue until it's gone.   Marland Kitchen Propylene Glycol (SYSTANE BALANCE) 0.6 % SOLN Place 1 drop into both eyes 2 (two) times daily.   Alveda Reasons 20 MG TABS tablet TAKE 1 TABLET DAILY AT     12:00 NOON    No facility-administered encounter medications on file as of 07/09/2017.     No Known Allergies     Review of Systems: Review of  Systems - General ROS: negative   Medicare Wellness Questionnaire  Are there smokers in your home (other than you)? no  Depression Screen (Note: if answer to either of the following is "Yes", a more complete depression screening is indicated)   Q1: Over the  past two weeks, have you felt down, depressed or hopeless? no  Q2: Over the past two weeks, have you felt little interest or pleasure in doing things? no  Have you lost interest or pleasure in daily life? no  Do you often feel hopeless? no  Do you cry easily over simple problems? no  Activities of Daily Living In your present state of health, do you have any difficulty performing the following activities?:  Driving? no Managing money?  no Feeding yourself? no Getting from bed to chair? no Climbing a flight of stairs? no Preparing food and eating?: no Bathing or showering? no Getting dressed: no Getting to the toilet? no Using the toilet: no Moving around from place to place: no In the past year have you fallen or had a near fall?: no  Hearing Difficulties:  Do you often ask people to speak up or repeat themselves? no Do you experience ringing or noises in your ears? no  Do you have difficulty understanding soft or whispered voices? no  Memory Difficulties:  Do you feel that you have a problem with memory? no  Do you often misplace items? no  Do you feel safe at home?  yes  Sexual Health:   Are you sexually active?  Yes  Do you have more than one partner?  No  Advanced Directives:   Advanced directives discussed: has an advanced directive - a copy HAS NOT been provided.  Additional information provided: no  Risk Factors  Current exercise habits: daily walking  Dietary issues discussed:no concerns  Cardiac risk factors: Diabetes Mellitus, hypertension, hypercholesterolemia/hyperlipidemia, positive family history, antiphospholipid ab syndrome    Exam:  BP (!) 143/88 (BP Location: Right Arm, Patient Position:  Sitting, Cuff Size: Normal)   Pulse 84   Temp 98.1 F (36.7 C) (Oral)   Wt 157 lb 14.4 oz (71.6 kg)   BMI 23.32 kg/m  Vision by Snellen chart: right eye:see nurse notes, left eye:see nurse notes  Constitutional: VS see above. General Appearance: alert, well-developed, well-nourished, NAD  Ears, Nose, Mouth, Throat: MMM  Neck: No masses, trachea midline.   Respiratory: Normal respiratory effort. no wheeze, no rhonchi, no rales  Cardiovascular:No lower extremity edema.   Musculoskeletal: Gait normal. No clubbing/cyanosis of digits.   Neurological: Normal balance/coordination. No tremor. Recalls 3 objects and able to read face of watch with correct time.   Skin: warm, dry, intact. No rash/ulcer.   Psychiatric: Normal judgment/insight. Normal mood and affect. Oriented x3.     ASSESSMENT/PLAN:   Encounter for Medicare annual wellness exam  Type 2 diabetes mellitus without complication, without long-term current use of insulin ([place]) - Plan: POCT HgB A1C, COMPLETE METABOLIC PANEL WITH GFR  Hyperlipidemia, unspecified hyperlipidemia type - Plan: Lipid panel  Long term current use of anticoagulant therapy  Essential hypertension  Gastroesophageal reflux disease, esophagitis presence not specified  Simple partial onset seizures followed by impaired consciousness ([place])  Polyp of colon, unspecified part of colon, unspecified type  CANCER SCREENING  Lung - USPSTF: 55-80yo w/ 30 py hx unless quit w/in 11yr - does not need  Colon - does not need  Prostate - does not need  Breast - does not need  Cervical - does not need OTHER DISEASE SCREENING  Lipid - does not need  DM2 - does not need  AAA - male 4-75yo ever smoked - does not need  Osteoporosis - women 71yo+, men 70yo+ - needs INFECTIOUS DISEASE SCREENING  HIV - does not need  GC/CT - does not need  HepC -does not need  TB - does not need ADULT VACCINATION  Influenza - annual vaccine recommended  Td  - booster every 10 years - does not need  Zoster - option at 50, yes at 9+ - does not need - has had Zostavax, will consisider Shingrix   PCV13 - does not need  PPSV23 - does not need Immunization History  Administered Date(s) Administered  . Influenza Split 12/07/2011, 11/19/2013  . Influenza Whole 12/04/2008, 12/10/2009  . Influenza-Unspecified 11/20/2012, 12/08/2014, 11/17/2015, 12/06/2016  . Pneumococcal Conjugate-13 05/28/2014  . Pneumococcal Polysaccharide-23 08/01/2006, 06/23/2015  . Td 11/20/2005  . Tdap 01/10/2016  . Zoster 01/03/2011   OTHER  Fall - exercise and Vit D age 28+ - does not need  Advanced Directives -  Discussed as above   During the course of the visit the patient was educated and counseled about appropriate screening and preventive services as noted above.   Patient Instructions (the written plan) was given to the patient.  Medicare Attestation I have personally reviewed: The patient's medical and social history Their use of alcohol, tobacco or illicit drugs Their current medications and supplements The patient's functional ability including ADLs,fall risks, home safety risks, cognitive, and hearing and visual impairment Diet and physical activities Evidence for depression or mood disorders  The patient's weight, height, BMI, and visual acuity have been recorded in the chart.  I have made referrals, counseling, and provided education to the patient based on review of the above and I have provided the patient with a written personalized care plan for preventive services.     Emeterio Reeve, DO   07/09/17   Visit summary with medication list and pertinent instructions was printed for patient to review. All questions at time of visit were answered - patient instructed to contact office with any additional concerns. ER/RTC precautions were reviewed with the patient. Follow-up plan: No follow-ups on file.

## 2017-07-09 NOTE — Telephone Encounter (Signed)
-----   Message from Emeterio Reeve, DO sent at 07/09/2017  9:15 AM EDT ----- Regarding: shingrix  Shingrix list please and thanks!

## 2017-07-09 NOTE — Telephone Encounter (Signed)
Added

## 2017-07-10 LAB — LIPID PANEL
CHOL/HDL RATIO: 3 (calc) (ref <5.0)
Cholesterol: 130 mg/dL (ref <200)
HDL: 43 mg/dL (ref >40)
LDL Cholesterol (Calc): 70 mg/dL (calc)
NON-HDL CHOLESTEROL (CALC): 87 mg/dL (ref <130)
TRIGLYCERIDES: 90 mg/dL (ref <150)

## 2017-07-10 LAB — COMPLETE METABOLIC PANEL WITH GFR
AG Ratio: 1.6 (calc) (ref 1.0–2.5)
ALT: 20 U/L (ref 9–46)
AST: 23 U/L (ref 10–35)
Albumin: 4.4 g/dL (ref 3.6–5.1)
Alkaline phosphatase (APISO): 40 U/L (ref 40–115)
BUN/Creatinine Ratio: 15 (calc) (ref 6–22)
BUN: 19 mg/dL (ref 7–25)
CALCIUM: 10.1 mg/dL (ref 8.6–10.3)
CO2: 28 mmol/L (ref 20–32)
CREATININE: 1.26 mg/dL — AB (ref 0.70–1.18)
Chloride: 107 mmol/L (ref 98–110)
GFR, EST NON AFRICAN AMERICAN: 57 mL/min/{1.73_m2} — AB (ref >=60)
GFR, Est African American: 66 mL/min/{1.73_m2} (ref >=60)
GLOBULIN: 2.7 g/dL (ref 1.9–3.7)
Glucose, Bld: 81 mg/dL (ref 65–99)
Potassium: 4.5 mmol/L (ref 3.5–5.3)
SODIUM: 143 mmol/L (ref 135–146)
Total Bilirubin: 0.8 mg/dL (ref 0.2–1.2)
Total Protein: 7.1 g/dL (ref 6.1–8.1)

## 2017-07-16 ENCOUNTER — Other Ambulatory Visit: Payer: Self-pay | Admitting: Osteopathic Medicine

## 2017-08-28 ENCOUNTER — Telehealth: Payer: Self-pay

## 2017-08-28 NOTE — Telephone Encounter (Signed)
Pt was on wait list for Shingrix Vaccine.    Pt insurance will not cover for this to be given in office.   RX pended to send to pharmacy for vaccine.   Please route back so I can let pt know.    Thanks!

## 2017-08-29 ENCOUNTER — Other Ambulatory Visit: Payer: Self-pay | Admitting: Osteopathic Medicine

## 2017-08-29 MED ORDER — ZOSTER VAC RECOMB ADJUVANTED 50 MCG/0.5ML IM SUSR: 0.5000 mL | Freq: Once | INTRAMUSCULAR | 1 refills | Status: AC

## 2017-08-29 NOTE — Telephone Encounter (Signed)
Called and advised pt that RX is at pharmacy and he will need to go there for vaccine

## 2017-08-29 NOTE — Telephone Encounter (Signed)
Sent to pharmacy on file 

## 2017-09-28 ENCOUNTER — Other Ambulatory Visit: Payer: Self-pay | Admitting: Osteopathic Medicine

## 2017-10-08 ENCOUNTER — Telehealth: Payer: Self-pay | Admitting: Neurology

## 2017-10-08 NOTE — Telephone Encounter (Addendum)
Per Dr. Jaynee Eagles, will have pt follow up with NP in office. Spoke with pt's wife Judson Roch. Scheduled him with [facility] on Wed 8/21 @ 1:30 arrival 1:00. Advised her that pt is not to drive now since he had a seizure. She verbalized understanding. Stated this seizure was medium and not as severe as in past. He is doing fine. She requested keppra level checks at his appt. She also stated that every time he has seized, it has been when he hasn't eaten. She wondered if his glucose had any correlation. Last CBG by EMS after seizure was 120. Advised to go to ED for any further concerning or severe seizures. She verbalized appreciation and understanding.

## 2017-10-08 NOTE — Telephone Encounter (Signed)
Pt wife on DPR-Dieguez,Sarah [phone] has called to inform that on yesterday the pt had a seizure(1st in a very ling time) wife states pt did not go to ED but was checked out by EMT's and was suggested to f/u with Neurologist.  Dr Jaynee Eagles doesn't have anything until October.  Pt moving in 2 weeks.  Wife is asking for a call for suggestions from RN

## 2017-10-10 ENCOUNTER — Ambulatory Visit (INDEPENDENT_AMBULATORY_CARE_PROVIDER_SITE_OTHER): Payer: Medicare Other | Admitting: Adult Health

## 2017-10-10 ENCOUNTER — Encounter: Payer: Self-pay | Admitting: Adult Health

## 2017-10-10 DIAGNOSIS — R569 Unspecified convulsions: Secondary | ICD-10-CM

## 2017-10-10 MED ORDER — LEVETIRACETAM 1000 MG PO TABS: 1000.0000 mg | ORAL_TABLET | Freq: Two times a day (BID) | ORAL | 3 refills | Status: DC

## 2017-10-10 MED ORDER — LEVETIRACETAM 1000 MG PO TABS: 1000.0000 mg | ORAL_TABLET | Freq: Two times a day (BID) | ORAL | 0 refills | Status: DC

## 2017-10-10 NOTE — Progress Notes (Signed)
PATIENT: Douglas Mercer DOB: November 12, 1945  REASON FOR VISIT: follow up HISTORY FROM: patient  HISTORY OF PRESENT ILLNESS: Today 10/10/17:  Douglas Mercer is a 72 year old male with a history of seizures. He returns today after having a breakthrough seizure. He reports that it occurred 09/27/17. Reports that he had not eaten that morning. He meet his wife for lunch and had a seizure at lunch. Wife reports that he slumped over and was convulsing in the upper extremities. No loss of bowels or bladder.  He reports that he did not miss any medication.  Denies sleep deprivation.  He is not operating a motor vehicle since his seizure.  He returns today for evaluation.  HISTORY He is stable. Ask Dr. Sheppard Coil to refill Keppra since so stable. Discussed seizures, recommend staying on medication for life. He has no side effects.   Interval history: Douglas Mercer. No seizures. No side effects to the medication. He is driving now again. No side effects from the medication. We discussed patient's situation and he should likely stay on Keppra for a minimum of several years if not indefinitely. Also discussed seizure precautions.  Interval history: Patient is doing Mercer. Wife is driving. He can drive in December if no events. No events. No side effects of the medication. Discussed the EEG below. Discussed seizures. Patient doing Mercer. Discussed EEG. Discussed Keppra and seizure management.   CONCLUSION: This is a mild abnormal awake and asleep EEG. There is no electrodiagnostic evidence of epileptiform discharge, there is evidence of intermittent left frontal slowing, suggestive of focal irritability.  CBJ:SEGBT Douglas Mercer a 72 y.o.malehere as a referral from Dr. Revonda Humphrey syncopal episode. Past medical history diabetes and high cholesterol. He has had 2 episodes, first one on mother's day he had an episode, second event this past Sunday when he fell backwards and hit his head on he concrete floor. Per  patient he does not remember anything, symptoms and all of a sudden he woke up on the floor. Wife is here and provides information: Both events happened at church. Per his wife, his left side started jerking, his eyes rolled back, he didn't respond for 30 seconds, she called the EMS on the first episode. No one saw the second episode start but found him slumped over. The second time, his eyes were rolled into his head, scared her to death, looked dead, could see the whites of his eyes. He had a tonic posture. Confusion afterwards. There were 2 nurses in attendance who both said looked like a seizure, duration less than a minute, he had hit his head on the concrete floor, no fracture. He was hospitalized at [facility]. No urination, no tongue biting. No previous history of seizures, he sometimes has uncontrollable shrugs and jerks. No personal history or family history of seizures. He has a history of meningitis. No inciting events or head trauma. No known triggering events. Nothing makes the symptoms worse or better. No other associated symptoms. No other focal neurologic deficits or complaints per patient and wife.  Reviewed notes, labs and imaging from outside physicians, which showed:  Patient has antiphospholipid syndrome, BPH, hyperlipidemia, diabetes and was seen in outpatient for history of syncope. Patient stated that on Mother's Day he was at church and became unresponsive, he has some chronic activity of his left arm and became unresponsive, lasting briefly, he was evaluated by EMS and was not taken in for evaluation. On 625 he was at church, he says he was feeling fine, no  prior palpitations or chest pain, he fell directly back hitting the back of his head, his eyes rolled back and he was unresponsive for approximately 2 minutes, no tongue injury incontinence, postictal confusion for a few minutes. He was transported to the local hospital and was evaluated. Review of records shows no other  syncopal episodes reported in the past.  Ultrasonic carotid arteries with color Doppler showed mild right carotid bulb plaque. Bilateral stenosis of less than 50%. Date 08/16/2015.  MRI of the brain without contrast showed no acute intracranial pathology. No extra-axial fluid collection. No major territorial vascular infarctions. Ventricles, sulci and other CSF spaces demonstrates moderate cerebral and cerebellar parenchymal volume loss. Mild scattered. Ventricular and subcortical white matter T2 FLAIR hyperintensities are nonspecific findings likely related to sequelae of chronic small vessel ischemic changes. No evidence of mass, mass effect or midline shift. Fourth ventricle is midline. Major intracranial vascular flow voids appear preserved. Orbits are grossly intact. Performed 08/16/2015.  Echocardiogram showed grossly normal left ventricular size and systolic function, ejection fraction 50-60%. Impaired relaxation of diastolic filling pattern, trace amount of aortic regurgitation, trace mitral regurgitation, right ventricular systolic pressure as measured by Dopplers 21.54 mmHg.  Personally reviewed EKG tracing which showed normal sinus rhythm, normal axis, no ST or T-wave changes, first degree AV block.  CBC normal, CMP unremarkable with normal creatinine 1.06, elevated glucose 132.  REVIEW OF SYSTEMS: Out of a complete 14 system review of symptoms, the patient complains only of the following symptoms, and all other reviewed systems are negative.  ALLERGIES: No Known Allergies  HOME MEDICATIONS: Outpatient Medications Prior to Visit  Medication Sig Dispense Refill  . aspirin 81 MG tablet Take 1 tablet (81 mg total) by mouth daily. 90 tablet 3  . atorvastatin (LIPITOR) 10 MG tablet Take 1 tablet (10 mg total) by mouth daily at 6 PM. 90 tablet 0  . Co-Enzyme Q-10 100 MG CAPS Take 100 mg by mouth daily.    . fenofibrate 160 MG tablet Take 1 tablet (160 mg total) by mouth daily. 90  tablet 3  . finasteride (PROSCAR) 5 MG tablet Take 1 tablet (5 mg total) by mouth daily. 90 tablet 3  . fish oil-omega-3 fatty acids 1000 MG capsule Take 2 g by mouth daily.      . fluticasone (FLONASE) 50 MCG/ACT nasal spray Place 2 sprays into the nose daily. 48 g 0  . glipiZIDE (GLUCOTROL XL) 5 MG 24 hr tablet TAKE 1 TABLET(5 MG) BY MOUTH DAILY WITH BREAKFAST 90 tablet 3  . latanoprost (XALATAN) 0.005 % ophthalmic solution INT 1 GTT INTO OU HS  2  . levETIRAcetam (KEPPRA) 750 MG tablet Take 1 tablet (750 mg total) by mouth 2 (two) times daily. 180 tablet 4  . metFORMIN (GLUCOPHAGE) 850 MG tablet TAKE 1 TABLET TWICE A DAY  WITH A MEAL 180 tablet 1  . Multiple Vitamin (MULTIVITAMIN) capsule Diabetic health Pack 6-7 pills a day    . pantoprazole (PROTONIX) 40 MG tablet Take 1 tablet (40 mg total) by mouth daily. 90 tablet 3  . Propylene Glycol (SYSTANE BALANCE) 0.6 % SOLN Place 1 drop into both eyes 2 (two) times daily.    Alveda Reasons 20 MG TABS tablet TAKE 1 TABLET DAILY AT     12:00 NOON 90 tablet 0  . atorvastatin (LIPITOR) 10 MG tablet Take 1 tablet (10 mg total) by mouth daily. Pt must keep appt on 07/09/17 for further refills. 90 tablet 0  . pantoprazole (PROTONIX)  40 MG tablet TAKE 1 TABLET DAILY BEFORE BREAKFAST 90 tablet 3  . rivaroxaban (XARELTO) 20 MG TABS tablet Take 1 tablet (20 mg total) by mouth daily. 90 tablet 3   No facility-administered medications prior to visit.     PAST MEDICAL HISTORY: Past Medical History:  Diagnosis Date  . Allergic rhinitis   . Anti-phospholipid antibody syndrome (HCC)   . BPH (benign prostatic hyperplasia)   . BPH (benign prostatic hypertrophy)   . Deficiency of clotting factor ([place])   . Diabetes mellitus    type 2  . DVT (deep venous thrombosis) ([place]) 02/2009   large left lower  . ED (erectile dysfunction)   . Epilepsy ([place])   . GERD (gastroesophageal reflux disease)   . Glaucoma   . Hyperlipidemia   . Hypertension   . Internal hemorrhoids    . Low HDL (under 40)   . Pulmonary embolism ([place]) 1992   bilateral w/ PE reportedly (+) lupus anticoagulant  . Rosacea   . Tubular adenoma of colon   . Type 2 diabetes mellitus without complication, without long-term current use of insulin ([place]) 07/14/2015    PAST SURGICAL HISTORY: Past Surgical History:  Procedure Laterality Date  . ADENOIDECTOMY  1959  . CATARACT EXTRACTION, BILATERAL     L eye on 04/24/17 and R eye on 05/08/17.  Marland Kitchen COLONOSCOPY  05/07/2006  . colonoscopy with polypectomy  07/07/2002   2 small descending colon polyps  . colonoscopy with polypectomy  04/19/2001   32mm sessile mucosal polyp in distal sigmoid colon  . diverticulosis  07/07/2002,04/19/2001   mild sigmoid  . ESOPHAGOGASTRODUODENOSCOPY    . HERNIA REPAIR     1995 and 08/13/2009  . [place] /2011   right-sided, mesh placed  . PROSTATE SURGERY     18 biopsies.  Benign.  . TONSILLECTOMY  1959  . TRANSURETHRAL RESECTION OF PROSTATE  08/13/09  . TRANSURETHRAL RESECTION OF PROSTATE  08/13/2009    FAMILY HISTORY: Family History  Problem Relation Age of Onset  . Diabetes Mother   . Melanoma Mother   . COPD Mother   . COPD Father   . Heart attack Maternal Uncle   . Stroke Maternal Aunt        grandparents  . Colon cancer Paternal Uncle        x2  . Prostate cancer Neg Hx   . Seizures Neg Hx   . Esophageal cancer Neg Hx     SOCIAL HISTORY: Social History   Socioeconomic History  . Marital status: Married    Spouse name: Judson Roch  . Number of children: 0  . Years of education: 34  . Highest education level: Not on file  Occupational History  . Occupation: Physiological scientist)    Employer: OTHER    Comment: Retired  Scientific laboratory technician  . Financial resource strain: Not on file  . Food insecurity:    Worry: Not on file    Inability: Not on file  . Transportation needs:    Medical: Not on file    Non-medical: Not on file  Tobacco Use  . Smoking status: Never Smoker    . Smokeless tobacco: Never Used  Substance and Sexual Activity  . Alcohol use: Yes    Alcohol/week: 0.0 standard drinks    Comment: socially  . Drug use: No  . Sexual activity: Not Currently  Lifestyle  . Physical activity:    Days per week: Not on file    Minutes  per session: Not on file  . Stress: Not on file  Relationships  . Social connections:    Talks on phone: Not on file    Gets together: Not on file    Attends religious service: Not on file    Active member of club or organization: Not on file    Attends meetings of clubs or organizations: Not on file    Relationship status: Not on file  . Intimate partner violence:    Fear of current or ex partner: Not on file    Emotionally abused: Not on file    Physically abused: Not on file    Forced sexual activity: Not on file  Other Topics Concern  . 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  Generalized: Mercer developed, in no acute distress   Neurological examination  Mentation: Alert oriented to time, place, history taking. Follows all commands speech and language fluent Cranial nerve II-XII: Pupils were equal round reactive to light. Extraocular movements were full, visual field were full on confrontational test. Facial sensation and strength were normal. Uvula tongue midline. Head turning and shoulder shrug  were normal and symmetric. Motor: The motor testing reveals 5 over 5 strength of all 4 extremities. Good symmetric motor tone is noted throughout.   Sensory: Sensory testing is intact to soft touch on all 4 extremities. No evidence of extinction is noted.  Coordination: Cerebellar testing reveals good finger-nose-finger and heel-to-shin bilaterally.  Gait and station: Gait is normal. Tandem gait is normal. Romberg is negative. No drift is seen.  Reflexes: Deep tendon reflexes are symmetric and normal bilaterally.   DIAGNOSTIC DATA (LABS, IMAGING, TESTING) - I reviewed patient records, labs, notes, testing and imaging myself where available.  Lab Results  Component Value Date   WBC 5.5 01/08/2017   HGB 15.4 01/08/2017   HCT 45.4 01/08/2017   MCV 89.2 01/08/2017   PLT 176 01/08/2017      Component Value Date/Time   NA 143 07/09/2017 0931   K 4.5 07/09/2017 0931   CL 107 07/09/2017 0931   CO2 28 07/09/2017 0931   GLUCOSE 81 07/09/2017 0931   BUN 19 07/09/2017 0931   CREATININE 1.26 (H) 07/09/2017 0931   CALCIUM 10.1 07/09/2017 0931   PROT 7.1 07/09/2017 0931   ALBUMIN 4.0 01/07/2017 1142   AST 23 07/09/2017 0931   ALT 20 07/09/2017 0931   ALKPHOS 39 01/07/2017 1142   BILITOT 0.8 07/09/2017 0931   GFRNONAA 57 (L) 07/09/2017 0931   GFRAA 66 07/09/2017 0931   Lab Results  Component Value Date   CHOL 130 07/09/2017   HDL 43 07/09/2017   LDLCALC 70 07/09/2017   LDLDIRECT 85.0 07/14/2015   TRIG 90 07/09/2017   CHOLHDL 3.0 07/09/2017   Lab Results  Component Value Date   HGBA1C 6.0 07/09/2017   No results found for:  [id number] Lab Results  Component Value Date   TSH 2.90 05/30/2012      ASSESSMENT AND PLAN 72 y.o. year old male  has a past medical history of Allergic rhinitis, Anti-phospholipid antibody syndrome ([place]), BPH (benign prostatic hyperplasia), BPH (benign prostatic hypertrophy), Deficiency of clotting factor ([place]), Diabetes mellitus, DVT (deep venous thrombosis) ([place]) (02/2009), ED (erectile dysfunction), Epilepsy ([place]), GERD (gastroesophageal reflux disease), Glaucoma, Hyperlipidemia, Hypertension,  Internal hemorrhoids, Low HDL (under 40), Pulmonary embolism ([place]) (1992), Rosacea, Tubular adenoma of colon, and Type 2 diabetes mellitus without complication, without long-term current use of insulin ([place]) (07/14/2015). here with:  1. Seizures  Patient had a breakthrough seizure perhaps due to low blood sugar? We will increase Keppra to 1000 mg twice a day. Patient is unable to drive, operate heavy machinery, perform activities at heights or participate in water activities until 6 months seizure free. Patient is planning a move to [facility] and will be seeking out a neurologist there. In the meantime they were encouraged to call if they have any additional seizures.      Ward Givens, MSN, NP-C 10/10/2017, 2:04 PM [facility] [address] [place], [place] [address] [phone]

## 2017-10-10 NOTE — Patient Instructions (Addendum)
Your Plan:  Increase Keppra 1000 mg twice a day If your symptoms worsen or you develop new symptoms please let us know.   Thank you for coming to see [place] at [facility]. I hope we have been able to provide you high quality care today.  You may receive a patient satisfaction survey over the next few weeks. We would appreciate your feedback and comments so that we may continue to improve ourselves and the health of our patients.

## 2017-10-11 ENCOUNTER — Encounter: Payer: Self-pay | Admitting: Adult Health

## 2017-10-14 IMAGING — US US EXTREM LOW VENOUS BILAT
1 series · 13 of 24 positions shown · non-contrast
Comparison: None

CLINICAL DATA: Fever x1 day. Bilateral leg swelling. History of DVT
and PE. Diabetes.

EXAM:
BILATERAL LOWER EXTREMITY VENOUS DOPPLER ULTRASOUND
TECHNIQUE: Gray-scale sonography with compression, as well as color and duplex
ultrasound, were performed to evaluate the deep venous system from
the level of the common femoral vein through the popliteal and
proximal calf veins.

[Series 1: us extrem low venous bilat · 0.08mm/px · 13 of 67 slices shown]
[im 1/67]
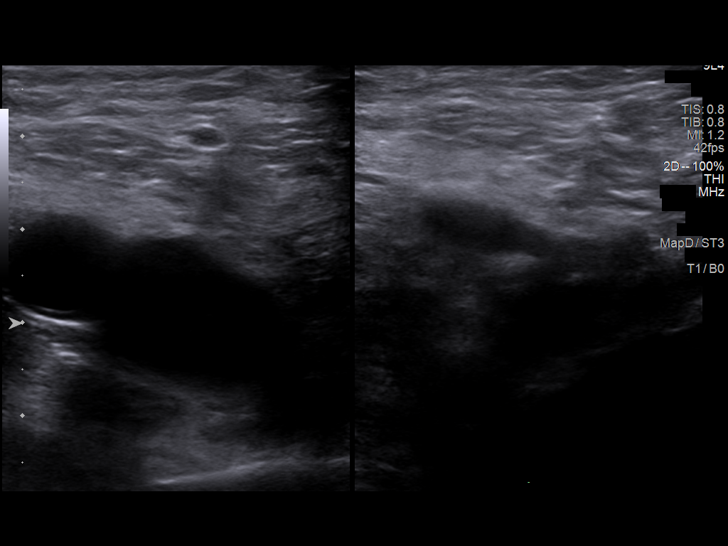
[im 6/67]
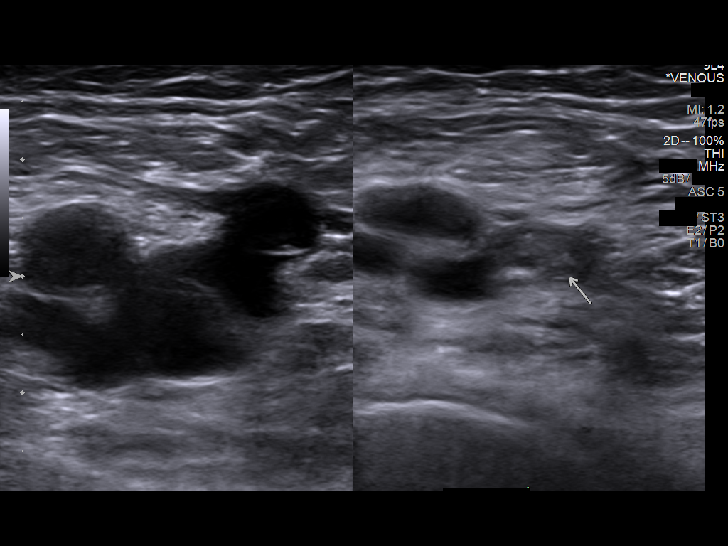
[im 12/67]
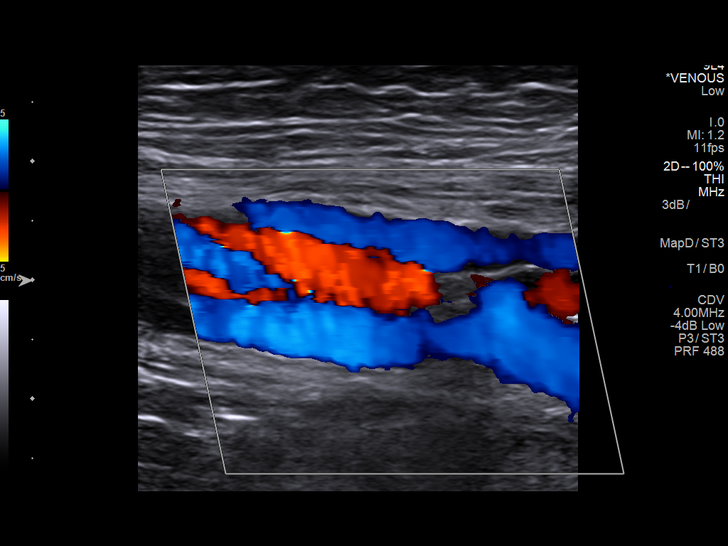
[im 18/67]
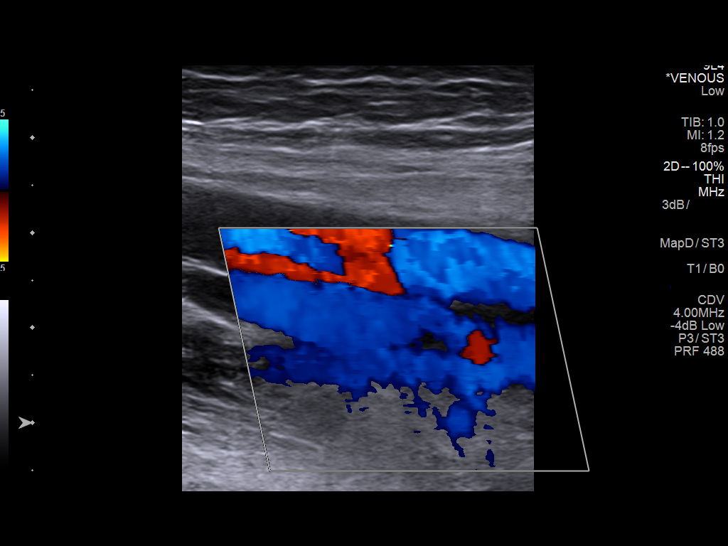
[im 23/67]
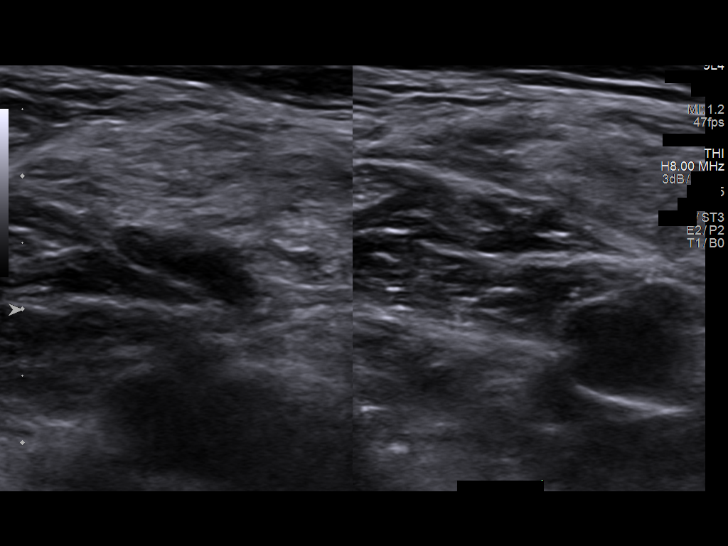
[im 29/67]
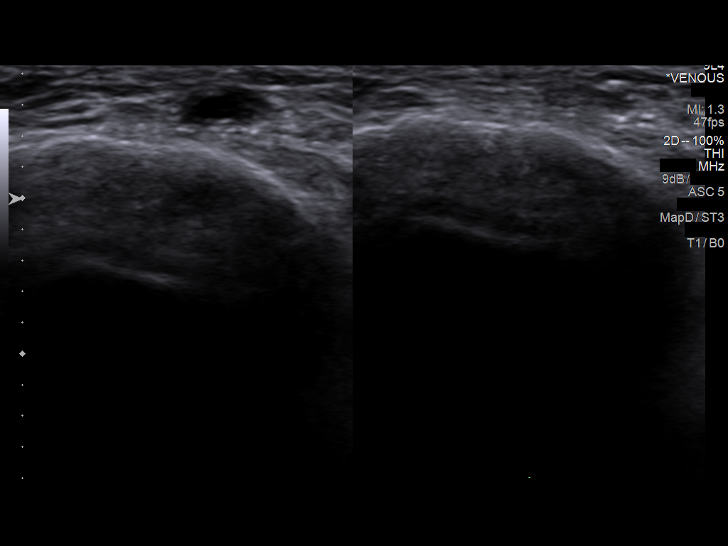
[im 35/67]
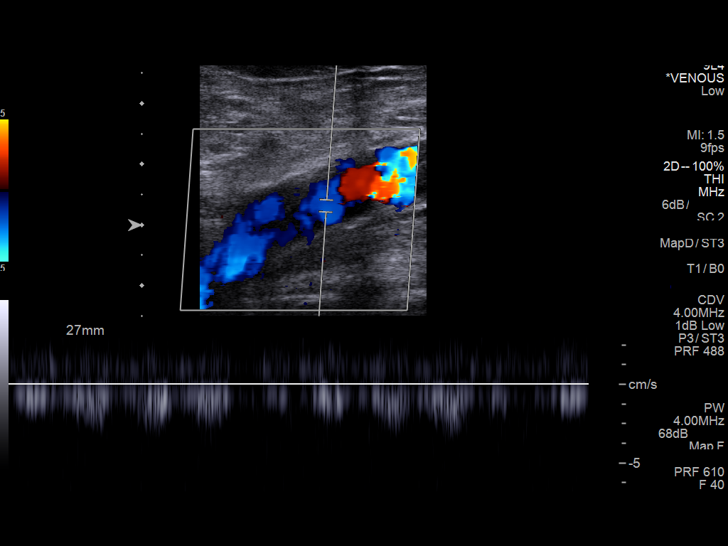
[im 38/67]
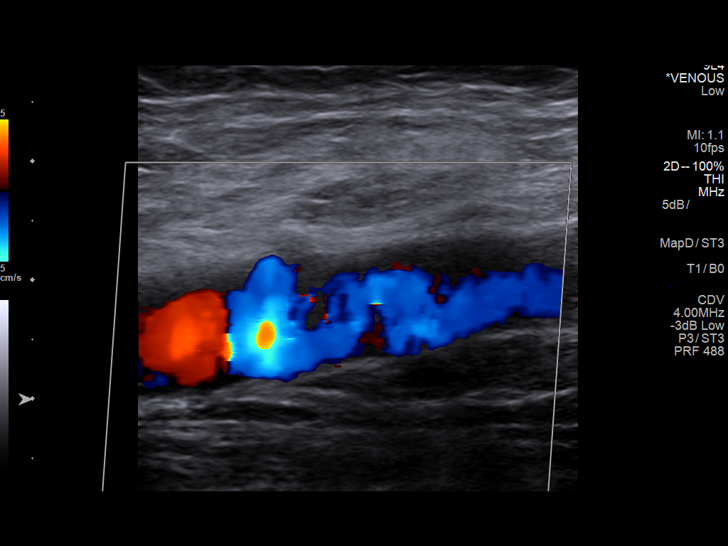
[im 44/67]
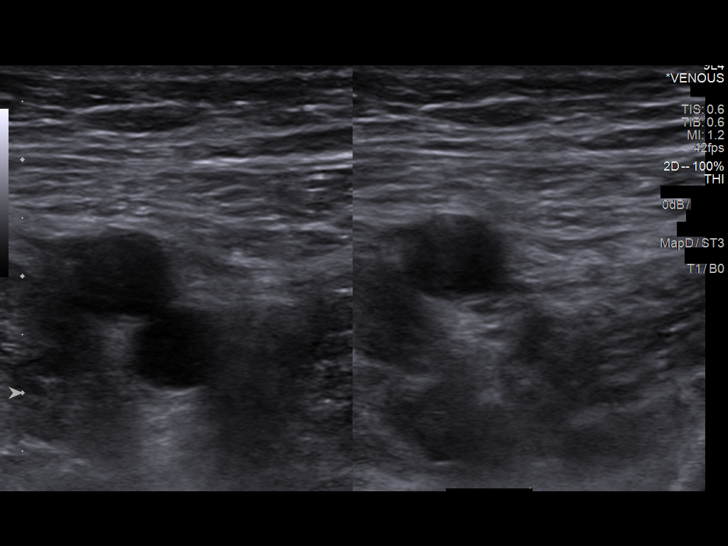
[im 49/67]
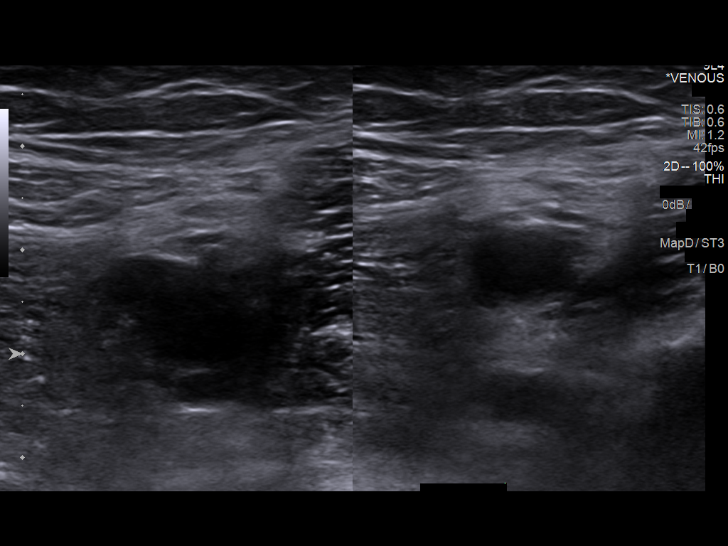
[im 55/67]
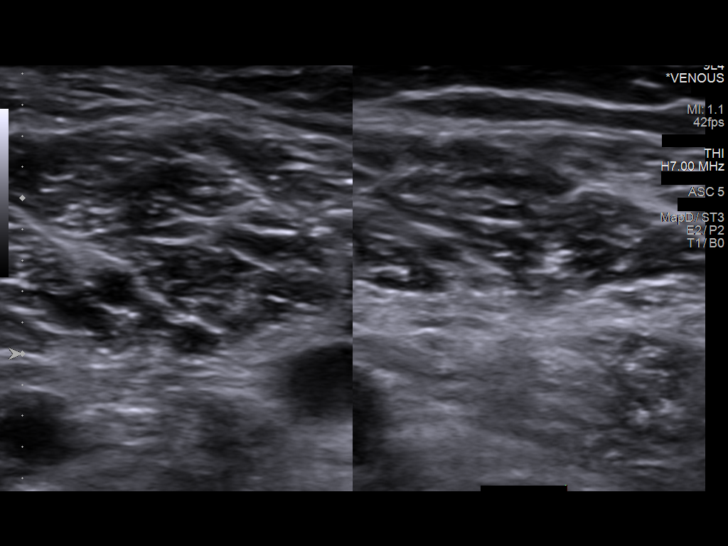
[im 61/67]
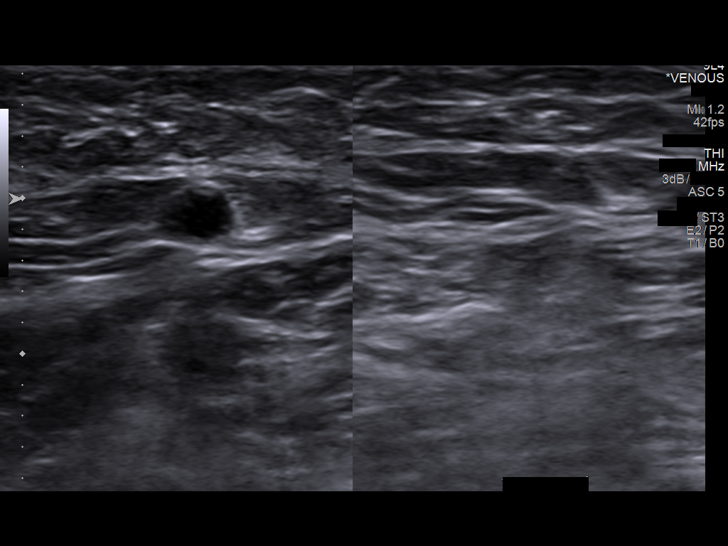
[im 67/67]
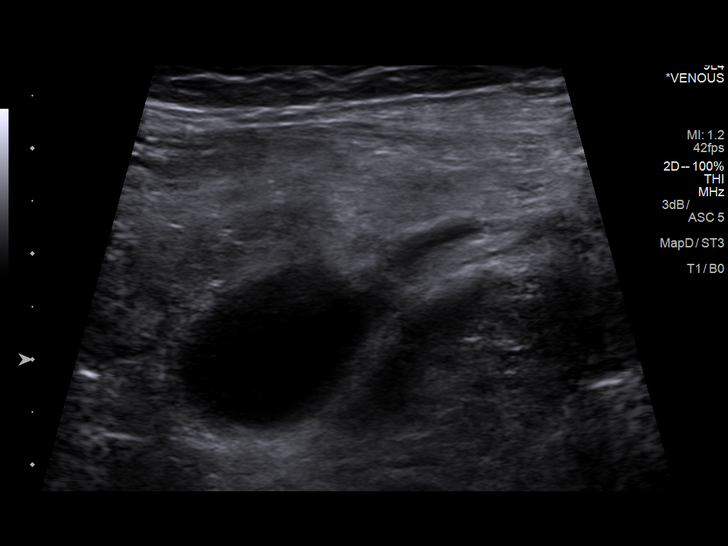

[13 of 24 positions shown; findings below may reference images not displayed]

FINDINGS: On the right, linear nonocclusive filling defects in the common
femoral vein. This results in incomplete compressibility although
there is good color flow signal around this lesion. Deep femoral
vein is normal. Linear filling defects in the distal femoral vein
resulting in incomplete compressibility. Popliteal vein and calf
veins are normal. Visualized segments of the saphenous venous system
normal in caliber and compressibility.

On the left, incomplete possibility of ectopic common femoral vein
with some eccentric mural thrombus, incompletely occlusive.
Eccentric mural thrombus is also seen in the deep femoral vein,
incompletely occlusive. Femoral vein is unremarkable. Linear
eccentric thrombus in the popliteal vein, incompletely occlusive,
resulting in incomplete compressibility. Calf veins are
unremarkable.
IMPRESSION: 1. Nonocclusive, linear, possibly chronic DVT in the right common
femoral and femoral veins, and on the left in the common femoral,
deep femoral and popliteal veins.

## 2017-10-15 ENCOUNTER — Other Ambulatory Visit: Payer: Self-pay | Admitting: *Deleted

## 2017-10-15 DIAGNOSIS — R569 Unspecified convulsions: Secondary | ICD-10-CM

## 2017-10-16 ENCOUNTER — Telehealth: Payer: Self-pay | Admitting: Neurology

## 2017-10-16 NOTE — Telephone Encounter (Signed)
Transfer of Care has been sent . Neurologist in [place], [place] . Telephone - [phone]- fax [phone]. Patient and his wife are aware.

## 2017-10-17 NOTE — Progress Notes (Signed)
Referral sent spoke to Patient and his wife .

## 2017-11-05 ENCOUNTER — Other Ambulatory Visit: Payer: Self-pay

## 2017-11-05 MED ORDER — GLIPIZIDE ER 5 MG PO TB24: ORAL_TABLET | ORAL | 3 refills | Status: DC

## 2018-01-07 ENCOUNTER — Other Ambulatory Visit: Payer: Self-pay

## 2018-01-09 ENCOUNTER — Ambulatory Visit: Payer: Medicare Other | Admitting: Osteopathic Medicine

## 2018-03-14 ENCOUNTER — Telehealth: Payer: Self-pay

## 2018-03-14 NOTE — Telephone Encounter (Signed)
Pt now living in [place] and has new provider.   I have removed Dr Sheppard Coil from PCP.  FYI

## 2018-06-22 ENCOUNTER — Other Ambulatory Visit: Payer: Self-pay | Admitting: Osteopathic Medicine

## 2020-09-22 DIAGNOSIS — E119 Type 2 diabetes mellitus without complications: Secondary | ICD-10-CM | POA: Diagnosis not present

## 2020-09-22 DIAGNOSIS — D6861 Antiphospholipid syndrome: Secondary | ICD-10-CM | POA: Diagnosis not present

## 2020-09-22 DIAGNOSIS — I10 Essential (primary) hypertension: Secondary | ICD-10-CM | POA: Diagnosis not present

## 2020-09-22 DIAGNOSIS — J9811 Atelectasis: Secondary | ICD-10-CM | POA: Diagnosis not present

## 2020-09-22 DIAGNOSIS — I517 Cardiomegaly: Secondary | ICD-10-CM | POA: Diagnosis not present

## 2020-09-22 DIAGNOSIS — R911 Solitary pulmonary nodule: Secondary | ICD-10-CM | POA: Diagnosis not present

## 2020-09-28 ENCOUNTER — Other Ambulatory Visit: Payer: Self-pay | Admitting: Sports Medicine

## 2020-09-28 ENCOUNTER — Other Ambulatory Visit (HOSPITAL_COMMUNITY): Payer: Self-pay | Admitting: Sports Medicine

## 2020-09-28 DIAGNOSIS — R29898 Other symptoms and signs involving the musculoskeletal system: Secondary | ICD-10-CM

## 2020-09-28 DIAGNOSIS — M5030 Other cervical disc degeneration, unspecified cervical region: Secondary | ICD-10-CM

## 2020-09-28 DIAGNOSIS — M4802 Spinal stenosis, cervical region: Secondary | ICD-10-CM | POA: Diagnosis not present

## 2020-09-29 DIAGNOSIS — R509 Fever, unspecified: Secondary | ICD-10-CM | POA: Diagnosis not present

## 2020-09-29 DIAGNOSIS — J069 Acute upper respiratory infection, unspecified: Secondary | ICD-10-CM | POA: Diagnosis not present

## 2020-09-29 DIAGNOSIS — R051 Acute cough: Secondary | ICD-10-CM | POA: Diagnosis not present

## 2020-09-30 ENCOUNTER — Other Ambulatory Visit: Payer: Self-pay | Admitting: Infectious Diseases

## 2020-09-30 DIAGNOSIS — R29898 Other symptoms and signs involving the musculoskeletal system: Secondary | ICD-10-CM

## 2020-09-30 DIAGNOSIS — R911 Solitary pulmonary nodule: Secondary | ICD-10-CM

## 2020-10-01 ENCOUNTER — Ambulatory Visit
Admission: RE | Admit: 2020-10-01 | Discharge: 2020-10-01 | Disposition: A | Discharge status: Home / Self Care | Payer: Medicare Other | Source: Ambulatory Visit | Attending: Sports Medicine | Admitting: Sports Medicine

## 2020-10-01 ENCOUNTER — Other Ambulatory Visit: Payer: Self-pay

## 2020-10-01 DIAGNOSIS — R29898 Other symptoms and signs involving the musculoskeletal system: Secondary | ICD-10-CM | POA: Insufficient documentation

## 2020-10-01 DIAGNOSIS — M4802 Spinal stenosis, cervical region: Secondary | ICD-10-CM | POA: Diagnosis not present

## 2020-10-01 DIAGNOSIS — M5030 Other cervical disc degeneration, unspecified cervical region: Secondary | ICD-10-CM | POA: Insufficient documentation

## 2020-10-01 DIAGNOSIS — S22009A Unspecified fracture of unspecified thoracic vertebra, initial encounter for closed fracture: Secondary | ICD-10-CM | POA: Diagnosis not present

## 2020-10-01 DIAGNOSIS — M47812 Spondylosis without myelopathy or radiculopathy, cervical region: Secondary | ICD-10-CM | POA: Diagnosis not present

## 2020-10-01 DIAGNOSIS — G9589 Other specified diseases of spinal cord: Secondary | ICD-10-CM | POA: Diagnosis not present

## 2020-10-01 IMAGING — MR MR CERVICAL SPINE W/O CM
5 series · 37 of 48 positions shown · non-contrast
Comparison: None available.

CLINICAL DATA: Initial evaluation for limited range of motion in
left elbow for 2 months.

EXAM:
MRI CERVICAL SPINE WITHOUT CONTRAST
TECHNIQUE: Multiplanar, multisequence MR imaging of the cervical spine was
performed. No intravenous contrast was administered.

[Series 2: T2 · sagittal · 3.0mm · 0.86mm/px · 6 of 13 slices shown (1 of 2)]
[im 1/13]
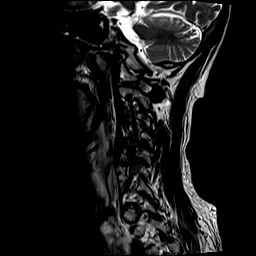
[im 3/13]
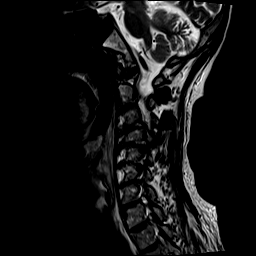
[im 5/13]
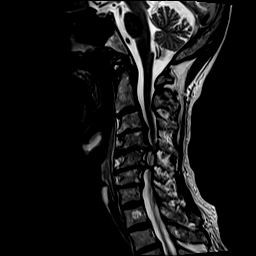
[im 8/13]
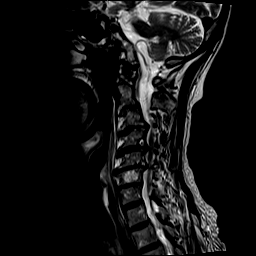
[im 10/13]
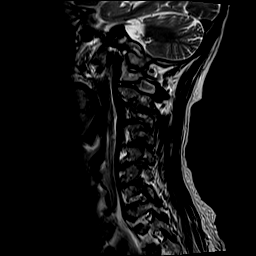
[im 13/13]
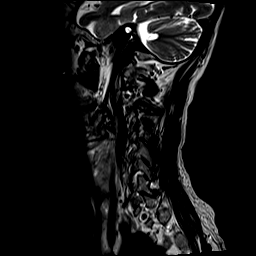

[Series 3: T1 · sagittal · 3.0mm · 0.86mm/px · 7 of 13 slices shown]
[im 1/13]
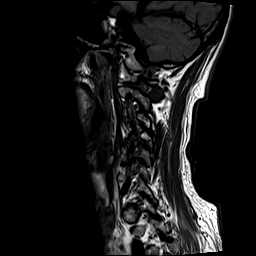
[im 3/13]
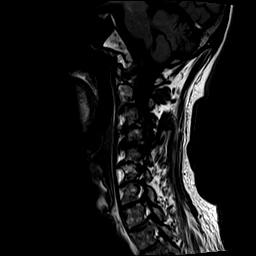
[im 5/13]
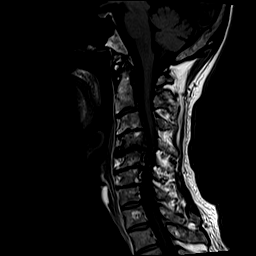
[im 7/13]
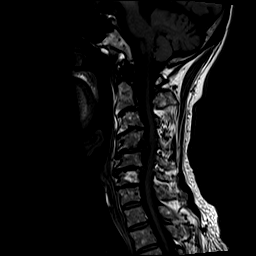
[im 9/13]
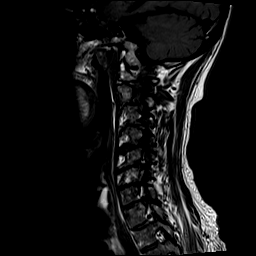
[im 11/13]
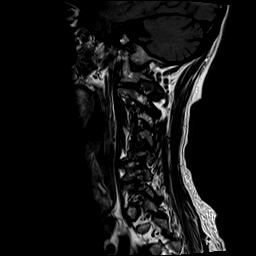
[im 13/13]
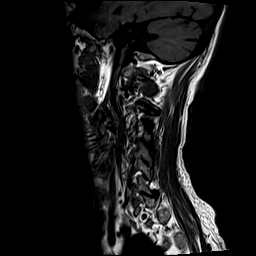

[Series 4: T2 · axial · 3.0mm · 0.78mm/px · z∈[-87,+9]mm · 9 of 27 slices shown (2 of 2)]
[im 1/27]
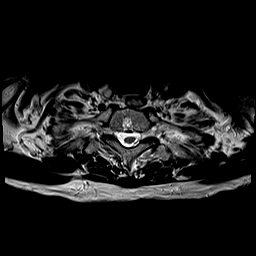
[im 3/27]
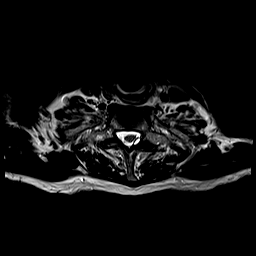
[im 5/27]
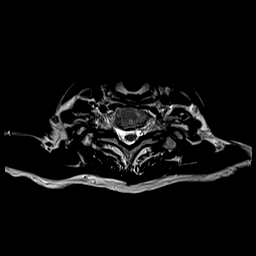
[im 9/27]
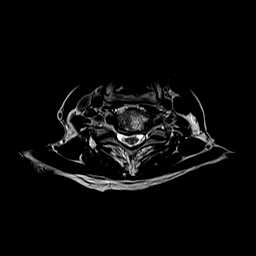
[im 13/27]
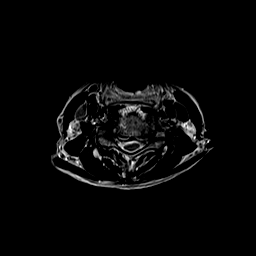
[im 15/27]
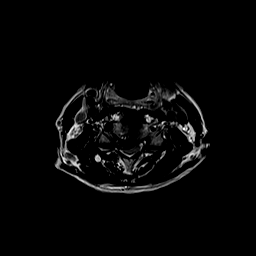
[im 19/27]
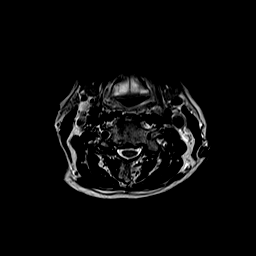
[im 23/27]
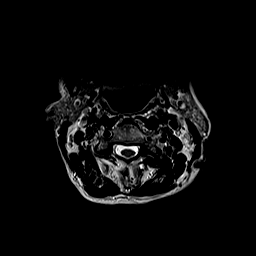
[im 27/27]
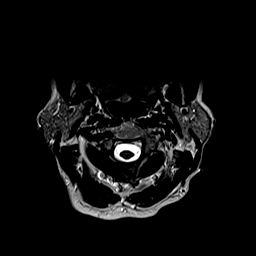

[Series 5: STIR · sagittal · 3.0mm · 0.86mm/px · 7 of 13 slices shown]
[im 1/13]
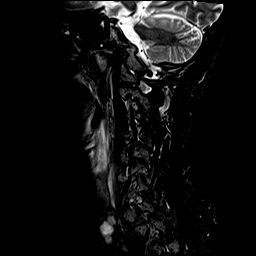
[im 3/13]
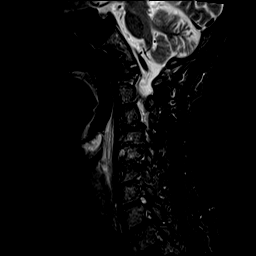
[im 5/13]
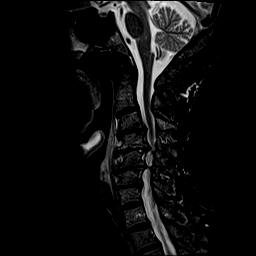
[im 7/13]
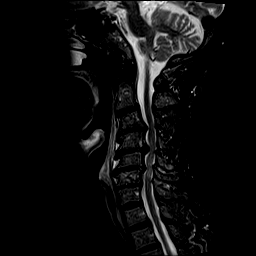
[im 9/13]
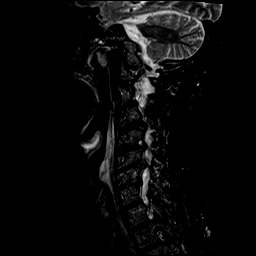
[im 11/13]
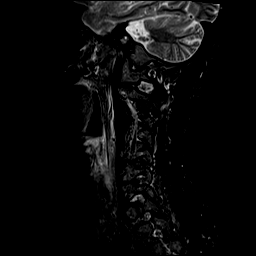
[im 13/13]
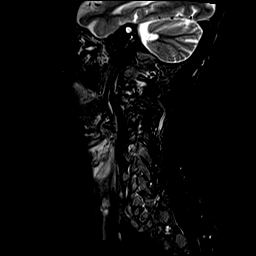

[Series 6: mpgr ax · axial · 3.0mm · 0.35mm/px · z∈[-84,+12]mm · 8 of 27 slices shown]
[im 1/27]
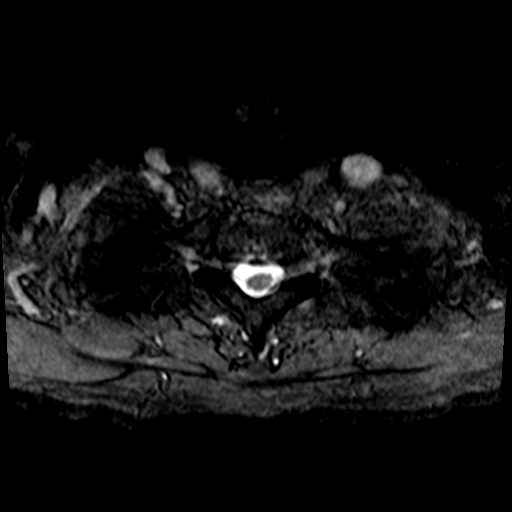
[im 5/27]
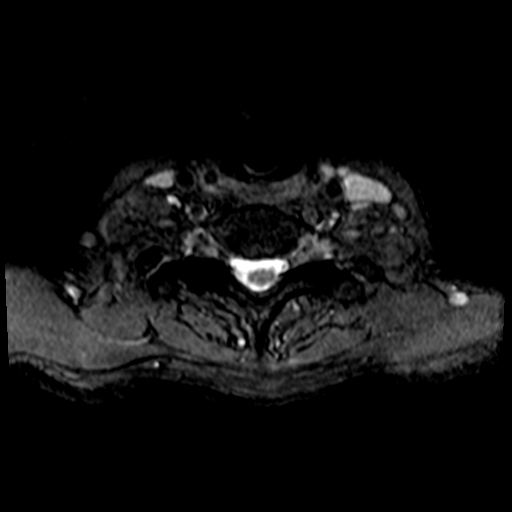
[im 9/27]
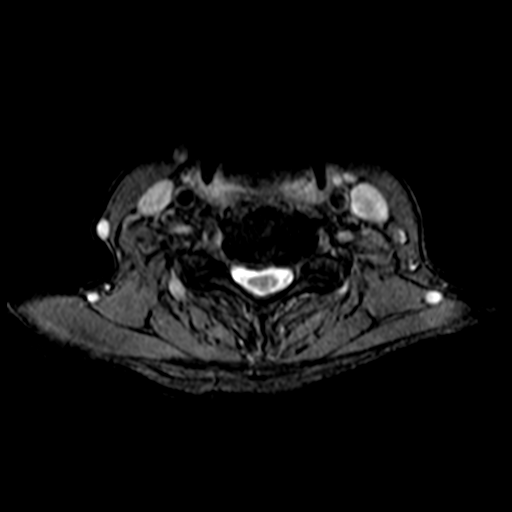
[im 13/27]
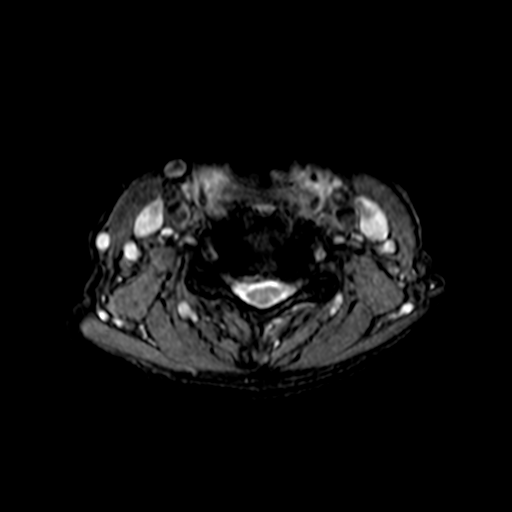
[im 15/27]
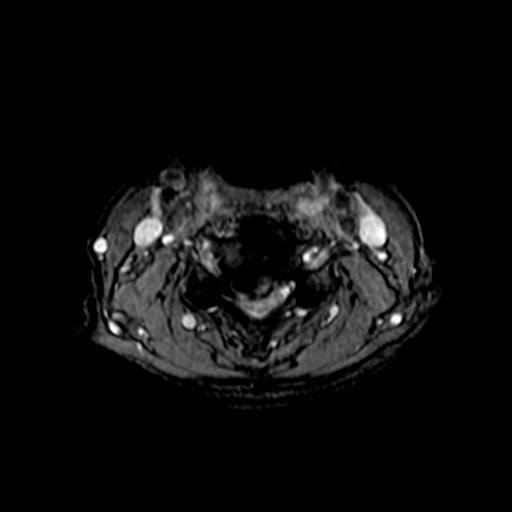
[im 19/27]
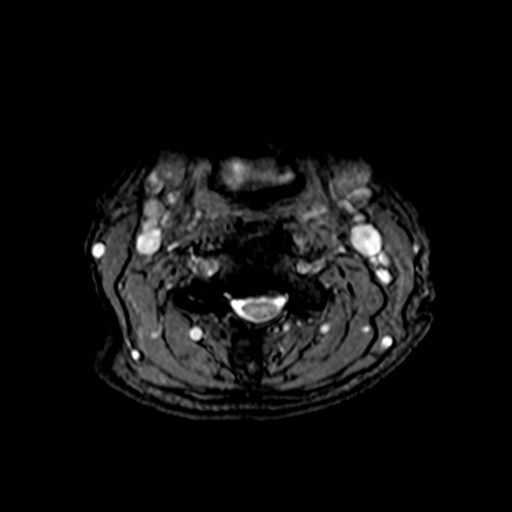
[im 23/27]
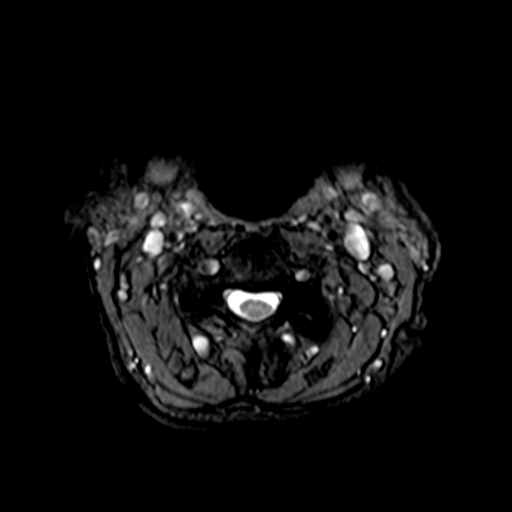
[im 27/27]
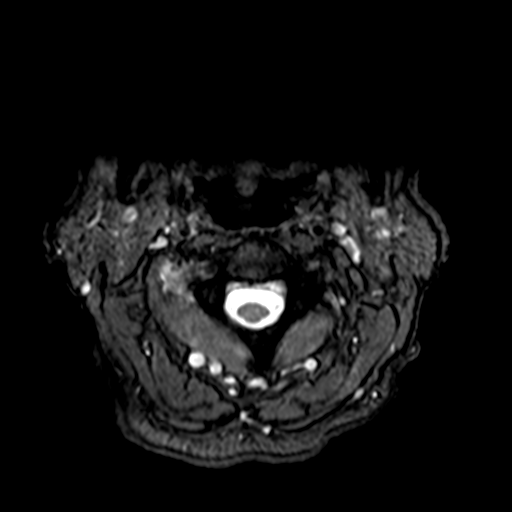

[37 of 48 positions shown; findings below may reference images not displayed]

FINDINGS: Alignment: Reversal of the normal cervical lordosis. 3 mm
anterolisthesis of C2 on C3, with trace retrolisthesis of C4 on C5
and C5 on C6. Findings chronic and degenerative.

Vertebrae: Vertebral body height maintained without acute or chronic
fracture. Bone marrow signal intensity diffusely heterogeneous with
a few scattered benign hemangiomata, most prominent of which
measures 1.3 cm within the C6 vertebral body. No worrisome osseous
lesions. No abnormal marrow edema.

Cord: Patchy signal abnormality within the cervical cord at the
level of C5-6 consistent with myelomalacia (series 4, image 19).

Posterior Fossa, vertebral arteries, paraspinal tissues: Probable
chronic microvascular ischemic disease noted within the partially
visualized brainstem. Visualized brain and posterior fossa otherwise
unremarkable. Craniocervical junction within normal limits.
Paraspinous and prevertebral soft tissues are normal. Normal flow
voids seen within the vertebral arteries bilaterally. 1 cm right
thyroid nodule partially visualize, of doubtful significance given
size and patient age, no follow-up imaging recommended (ref: [HOSPITAL]. [DATE]): 143-50).

Disc levels:

C2-C3: 3 mm anterolisthesis. Mild disc bulge with uncovertebral
hypertrophy. Moderate left with mild right facet degeneration. No
spinal stenosis. Moderate left C3 foraminal narrowing. Right neural
foramina remains patent.

C3-C4: Advanced degenerative intervertebral disc space narrowing
with diffuse disc osteophyte complex. Posterior component flattens
and effaces the ventral thecal sac with resultant moderate spinal
stenosis. Mild cord flattening without cord signal changes. Severe
bilateral C4 foraminal stenosis.

C4-C5: Advanced degenerative intervertebral disc space narrowing
with diffuse disc osteophyte complex. Large right
paracentral/subarticular component indents the right ventral thecal
sac (series 4, image 13). This impinges upon the right hemi cord
with secondary cord flattening, but no definite cord signal changes
at this level. Severe spinal stenosis, greatest on the right.
Associated severe bilateral C5 foraminal stenosis, also worse on the
right.

C5-C6: Degenerative intervertebral disc space narrowing with diffuse
disc osteophyte complex. Broad posterior component flattens and
effaces the ventral thecal sac, impinging upon the cervical spinal
cord. Secondary cord flattening with associated cord signal changes,
consistent with myelomalacia. Severe spinal stenosis with the thecal
sac measuring 5 mm in AP diameter at its most narrow point. Severe
bilateral C6 foraminal narrowing.

C6-C7: Degenerative intervertebral disc space narrowing with diffuse
disc osteophyte. Right paracentral component indents the right
ventral thecal sac (series 4, image 20). Flattening of the right
hemi cord with moderate spinal stenosis. Severe left with moderate
right C7 foraminal stenosis.

C7-T1: Negative interspace. Bilateral facet hypertrophy. 7 mm cystic
lesion at the left ligamentum flavum could reflect cystic
ligamentous degeneration versus a small synovial cyst. No
significant spinal stenosis. Foramina remain patent.

Visualized upper thoracic spine demonstrates mild noncompressive
disc bulging at T1-2 and T2-3 without significant stenosis.
IMPRESSION: 1. Advanced multilevel cervical spondylosis with resultant moderate
to severe spinal stenosis at C3-4 through C6-7, severe at the C4-5
and C5-6 levels.
2. Patchy cord signal abnormality at the level of C5-6, consistent
with myelomalacia.
3. Multifactorial degenerative changes with resultant multilevel
foraminal narrowing as above. Notable findings include moderate left
C3 foraminal stenosis, severe bilateral C4, C5, and C6 foraminal
narrowing, with severe left and moderate right C7 foraminal
stenosis.

## 2020-10-13 ENCOUNTER — Other Ambulatory Visit (HOSPITAL_COMMUNITY): Payer: Self-pay | Admitting: Neurosurgery

## 2020-10-13 ENCOUNTER — Other Ambulatory Visit: Payer: Self-pay | Admitting: Neurosurgery

## 2020-10-13 DIAGNOSIS — R531 Weakness: Secondary | ICD-10-CM

## 2020-10-14 ENCOUNTER — Ambulatory Visit
Admission: RE | Admit: 2020-10-14 | Discharge: 2020-10-14 | Disposition: A | Discharge status: Home / Self Care | Payer: Medicare Other | Source: Ambulatory Visit | Attending: Infectious Diseases | Admitting: Infectious Diseases

## 2020-10-14 ENCOUNTER — Other Ambulatory Visit: Payer: Self-pay

## 2020-10-14 DIAGNOSIS — R911 Solitary pulmonary nodule: Secondary | ICD-10-CM | POA: Insufficient documentation

## 2020-10-14 DIAGNOSIS — R29898 Other symptoms and signs involving the musculoskeletal system: Secondary | ICD-10-CM | POA: Diagnosis present

## 2020-10-14 IMAGING — CT CT CHEST W/O CM
2 of 4 series · 15 of 36 positions shown, 18 images · non-contrast
Comparison: Chest CTA [DATE]

CLINICAL DATA: Follow up pulmonary nodules. No history of
malignancy or prior relevant surgery.

EXAM:
CT CHEST WITHOUT CONTRAST
TECHNIQUE: Multidetector CT imaging of the chest was performed following the
standard protocol without IV contrast.

[Series 2: chest 2.00 · axial · 0.65mm/px · z∈[-1236,-934]mm · 12 of 179 slices shown, 15 images]
[im 14/179  mediastinal]
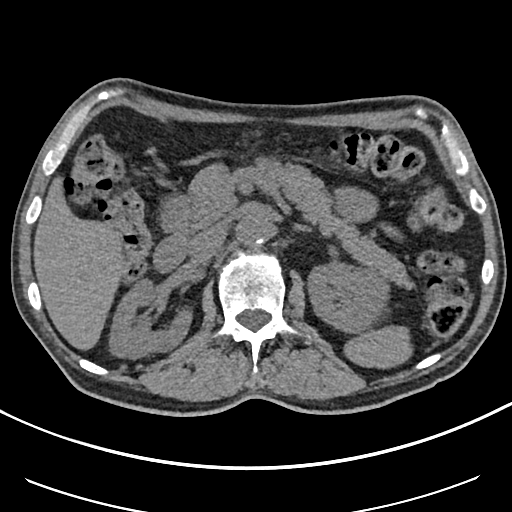
[im 14/179  lung]
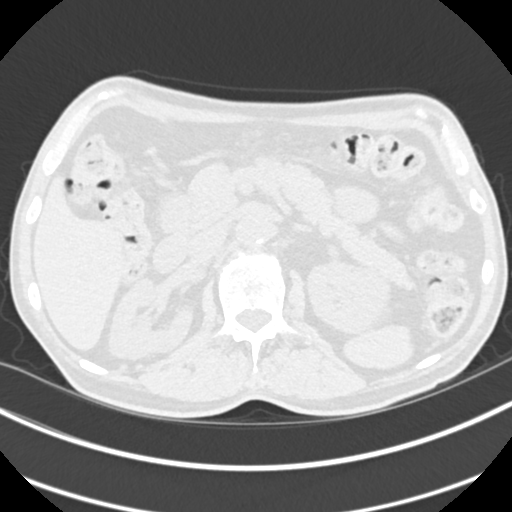
[im 28/179  lung]
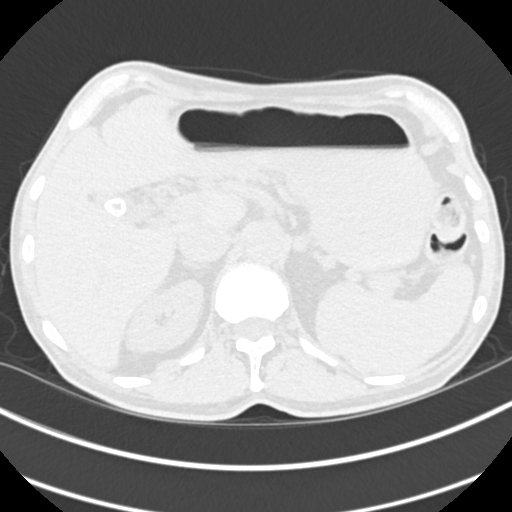
[im 42/179  lung]
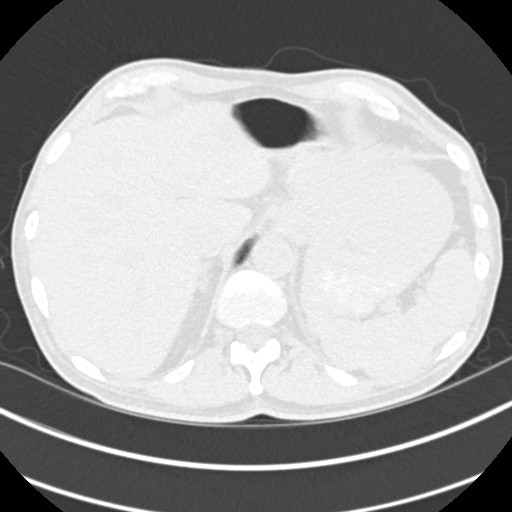
[im 55/179  lung]
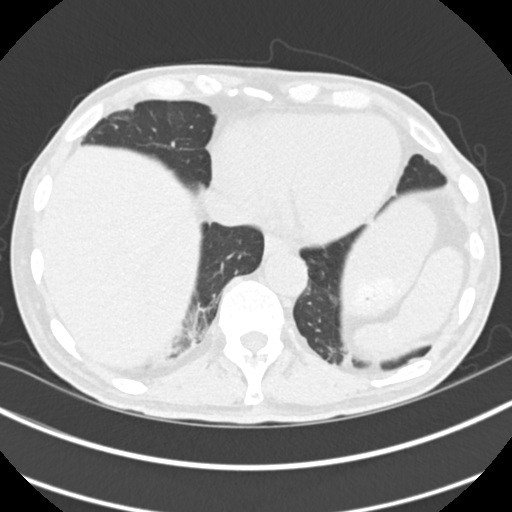
[im 69/179  mediastinal]
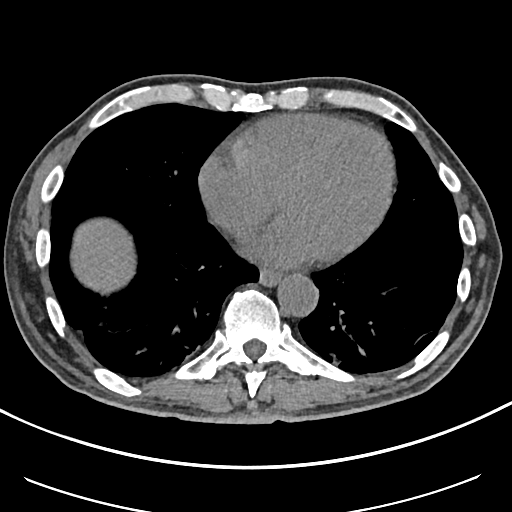
[im 69/179  lung]
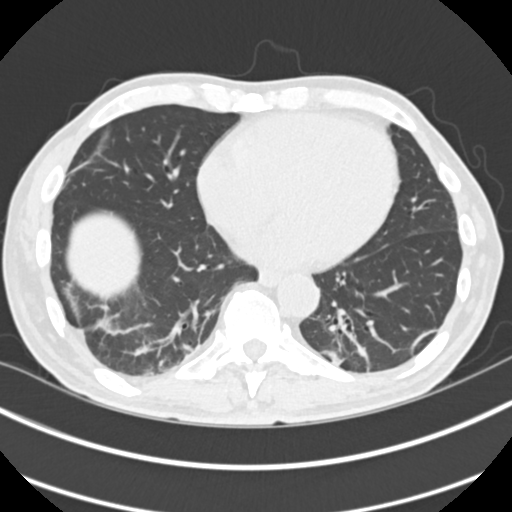
[im 83/179  lung]
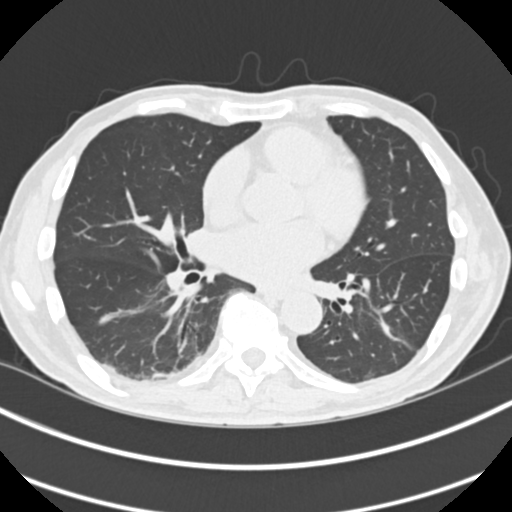
[im 96/179  lung]
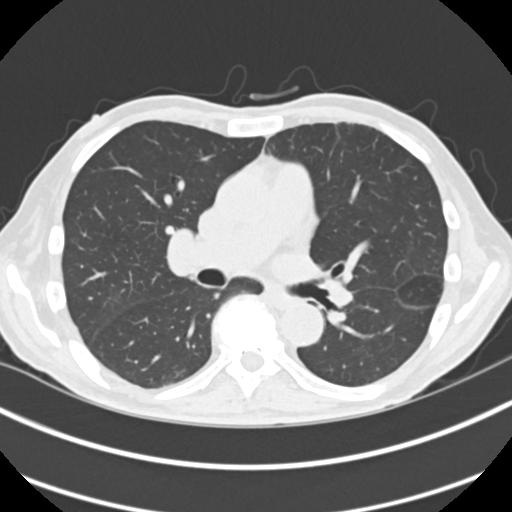
[im 110/179  lung]
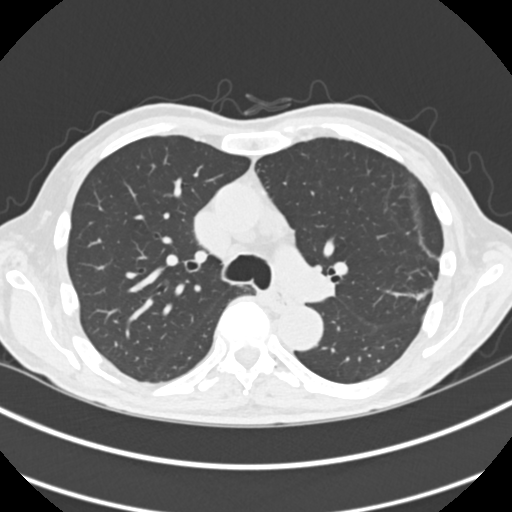
[im 124/179  mediastinal]
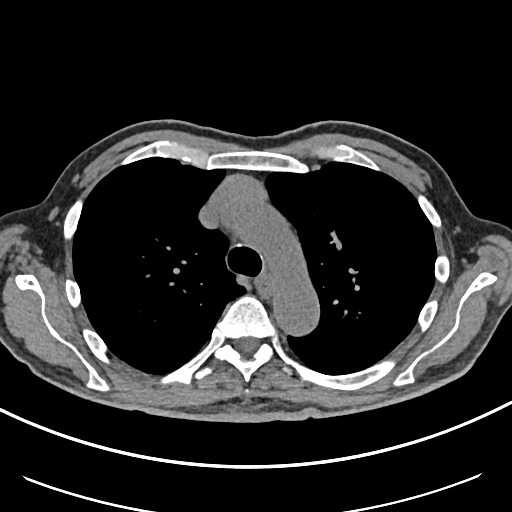
[im 124/179  lung]
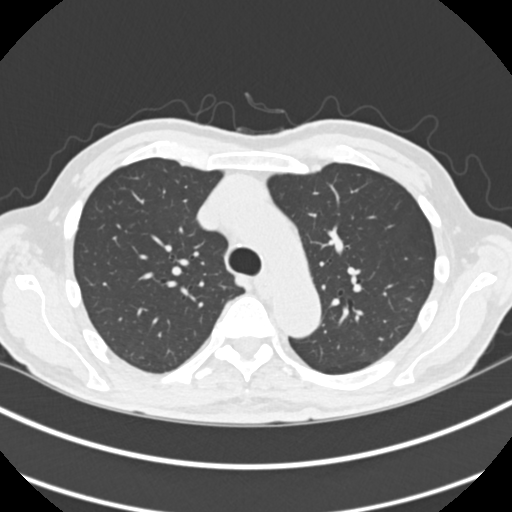
[im 137/179  lung]
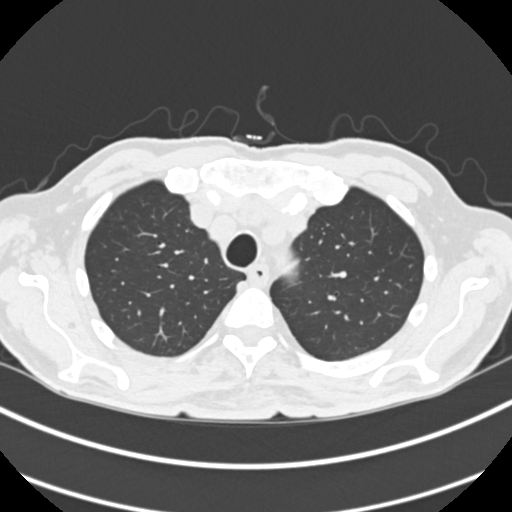
[im 151/179  lung]
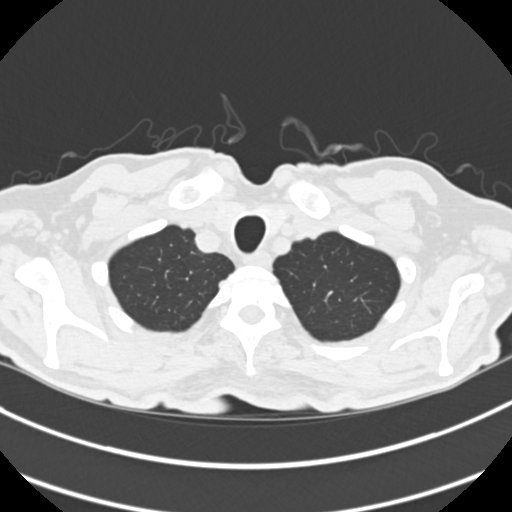
[im 165/179  lung]
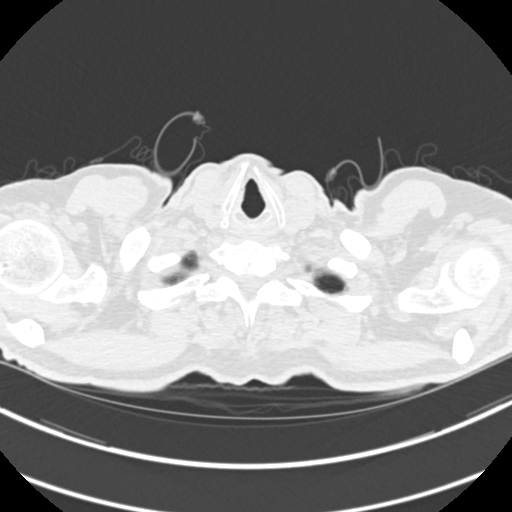

[Series 5: coronals chest 2.00 cor · coronal · 0.65mm/px · 3 of 122 slices shown]
[im 25/122  lung]
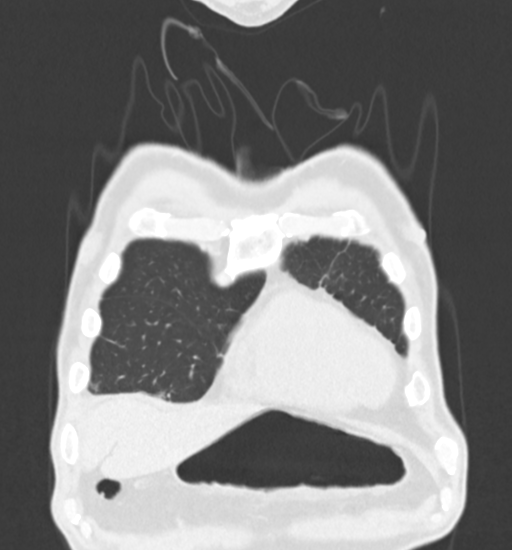
[im 49/122  lung]
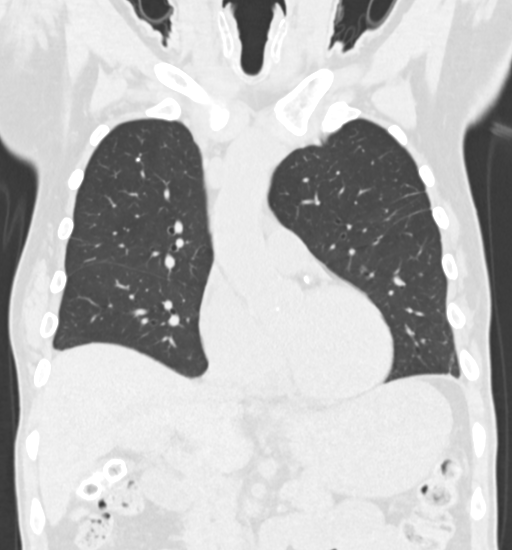
[im 73/122  lung]
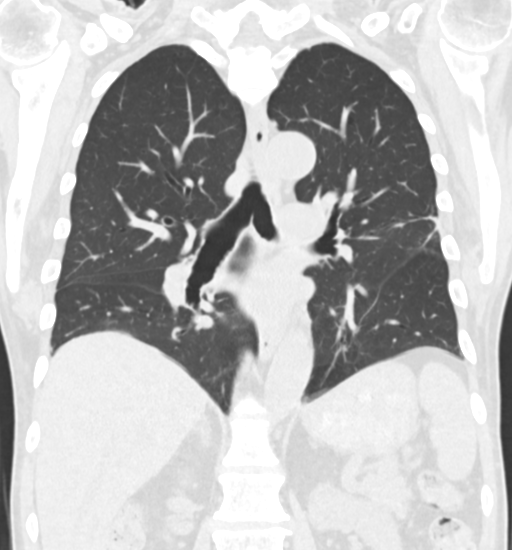

[15 of 36 positions shown; findings below may reference images not displayed]

FINDINGS: Cardiovascular: Minimal aortic and coronary artery atherosclerosis.
No acute vascular findings on noncontrast imaging. The heart size is
normal. There is no pericardial effusion.

Mediastinum/Nodes: There are no enlarged mediastinal, hilar or
axillary lymph nodes.Hilar assessment is limited by the lack of
intravenous contrast, although the hilar contours appear unchanged.
The thyroid gland, trachea and esophagus demonstrate no significant
findings.

Lungs/Pleura: There is no pleural effusion. The overall aeration of
both lung bases has improved with residual underlying bibasilar and
peripheral left upper lobe scarring. 5 mm right lower lobe nodule on
image 113/3 is stable. No new or enlarging pulmonary nodules.

Upper abdomen: Multiple calcified gallstones are again noted. There
is a small cyst in the interpolar region of the left kidney.

Musculoskeletal/Chest wall: There is no chest wall mass or
suspicious osseous finding. Stable degenerative changes in the
spine.
IMPRESSION: 1. Stable 5 mm right lower lobe pulmonary nodule consistent with a
benign finding. Per consensus guidelines, no additional follow-up
necessary. This recommendation follows the consensus statement:
Guidelines for Management of Small Pulmonary Nodules Detected on CT
Images: From the [HOSPITAL] [0G]; Radiology [0G];
[DATE].
2. Interval improved aeration of both lung bases with scattered
pulmonary scarring bilaterally. No new or enlarging pulmonary
nodules.
3. No acute chest findings.
4. Cholelithiasis.

## 2020-10-21 ENCOUNTER — Other Ambulatory Visit: Payer: Self-pay

## 2020-10-21 ENCOUNTER — Ambulatory Visit
Admission: RE | Admit: 2020-10-21 | Discharge: 2020-10-21 | Disposition: A | Discharge status: Home / Self Care | Payer: Medicare Other | Source: Ambulatory Visit | Attending: Neurosurgery | Admitting: Neurosurgery

## 2020-10-21 DIAGNOSIS — R531 Weakness: Secondary | ICD-10-CM | POA: Diagnosis not present

## 2020-10-21 IMAGING — MR MR HEAD W/O CM
10 series · 48 of 48 positions shown · non-contrast
Comparison: Brain MRI [DATE].

CLINICAL DATA: Weakness [ME] ([ME]-CM). Additional history
provided by scanning technologist: Patient reports generalized
weakness in left arm, unable to lift anything with left arm.

EXAM:
MRI HEAD WITHOUT CONTRAST
TECHNIQUE: Multiplanar, multiecho pulse sequences of the brain and surrounding
structures were obtained without intravenous contrast.

[Series 2: DWI · axial · 3.0mm · 1.20mm/px · z∈[-38,+123]mm · 9 of 108 slices shown (1 of 4)]
[im 1/108]
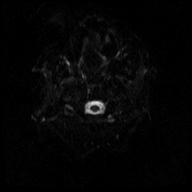
[im 14/108]
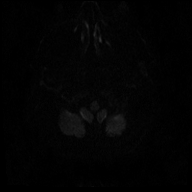
[im 27/108]
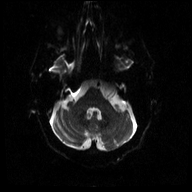
[im 41/108]
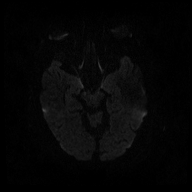
[im 54/108]
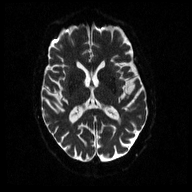
[im 67/108]
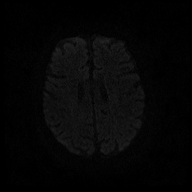
[im 81/108]
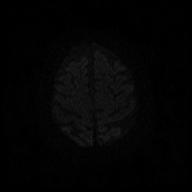
[im 94/108]
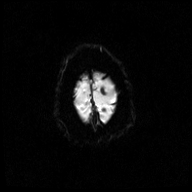
[im 108/108]
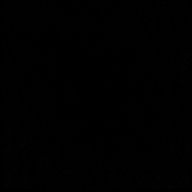

[Series 3: DWI · axial · 3.0mm · 1.20mm/px · z∈[-38,+123]mm · 4 of 54 slices shown (2 of 4)]
[im 1/54]
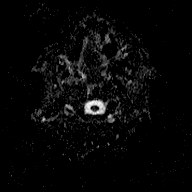
[im 18/54]
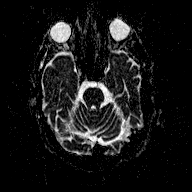
[im 36/54]
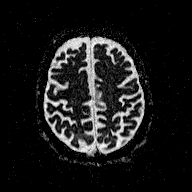
[im 54/54]
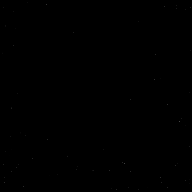

[Series 4: DWI · coronal · 3.0mm · 1.15mm/px · 7 of 94 slices shown (3 of 4)]
[im 1/94]
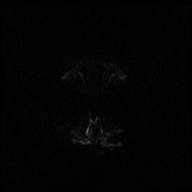
[im 16/94]
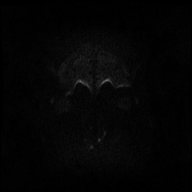
[im 32/94]
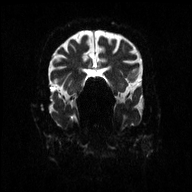
[im 47/94]
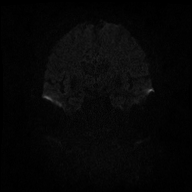
[im 63/94]
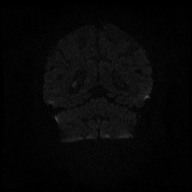
[im 78/94]
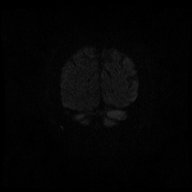
[im 94/94]
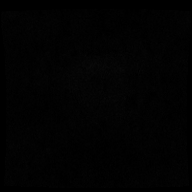

[Series 5: DWI · coronal · 3.0mm · 1.15mm/px · 4 of 48 slices shown (4 of 4)]
[im 1/48]
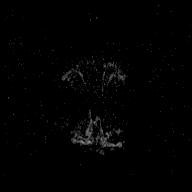
[im 16/48]
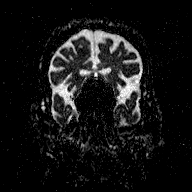
[im 32/48]
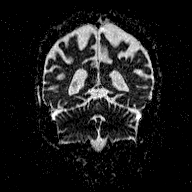
[im 48/48]
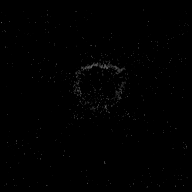

[Series 6: T1 · sagittal · 5.0mm · 0.45mm/px · 2 of 23 slices shown (1 of 2)]
[im 1/23]
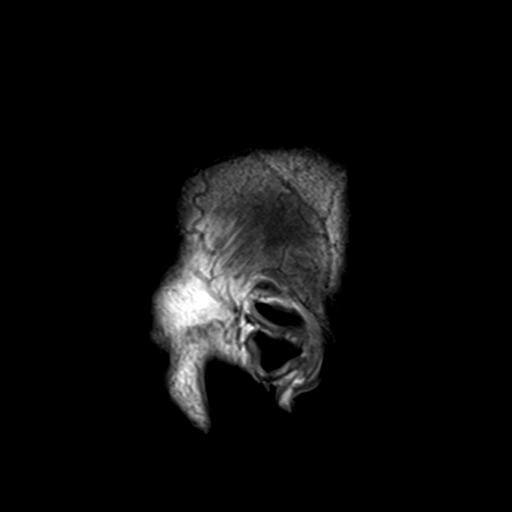
[im 23/23]
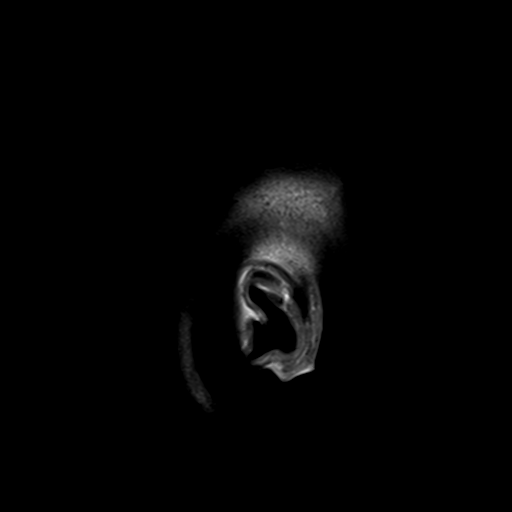

[Series 7: T2 · axial · 5.0mm · 0.72mm/px · z∈[-41,+126]mm · 2 of 25 slices shown (1 of 3)]
[im 1/25]
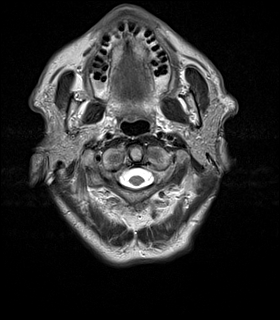
[im 25/25]
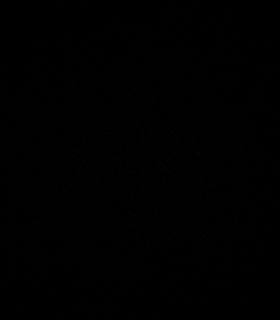

[Series 8: FLAIR · axial · 3.0mm · 0.45mm/px · z∈[-38,+123]mm · 4 of 55 slices shown]
[im 1/55]
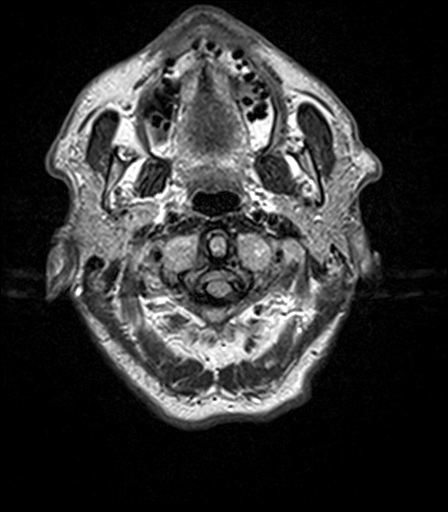
[im 19/55]
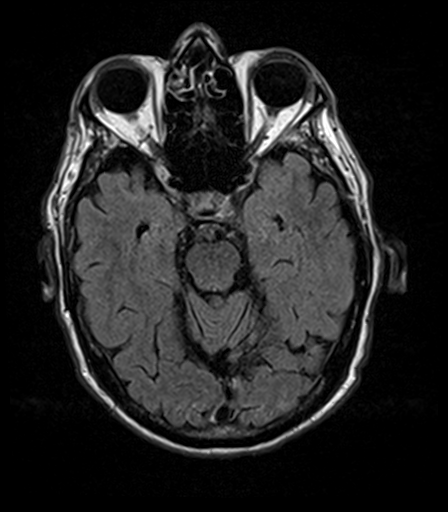
[im 37/55]
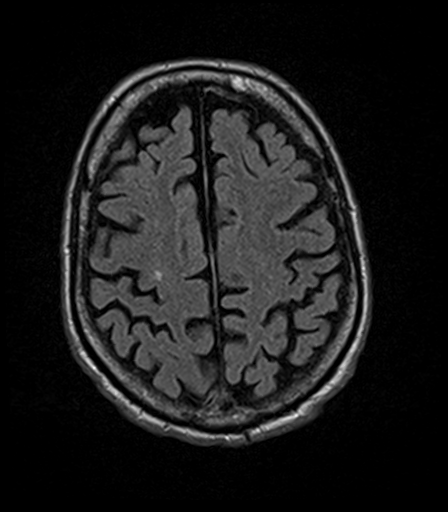
[im 55/55]
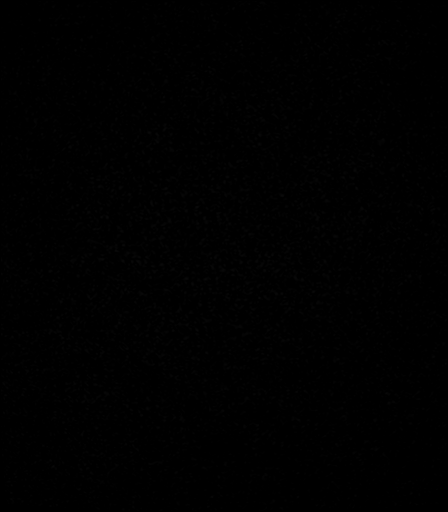

[Series 9: T2 · axial · 5.0mm · 0.72mm/px · z∈[-41,+126]mm · 2 of 25 slices shown (2 of 3)]
[im 1/25]
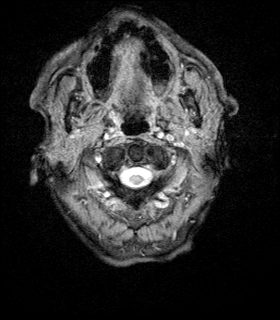
[im 25/25]
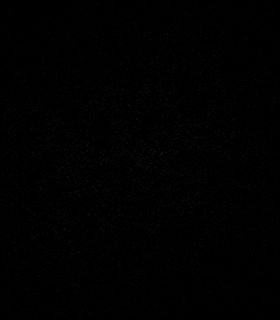

[Series 10: T1 · axial · 1.0mm · 0.94mm/px · z∈[-37,+121]mm · 12 of 160 slices shown (2 of 2)]
[im 1/160]
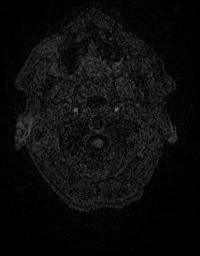
[im 15/160]
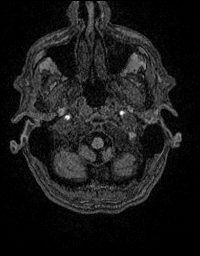
[im 29/160]
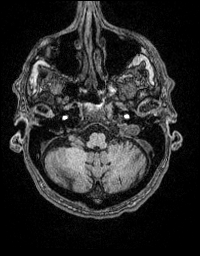
[im 44/160]
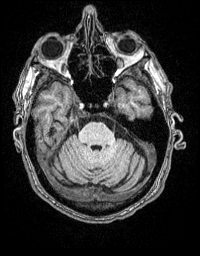
[im 58/160]
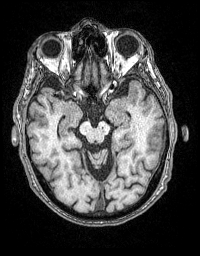
[im 73/160]
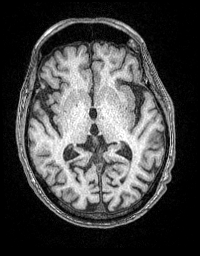
[im 87/160]
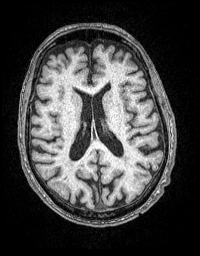
[im 102/160]
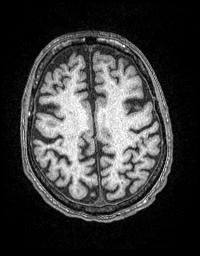
[im 116/160]
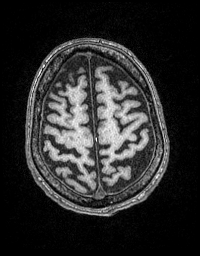
[im 131/160]
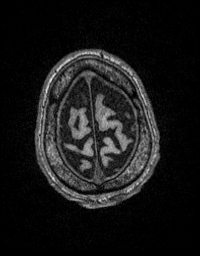
[im 145/160]
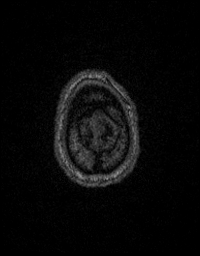
[im 160/160]
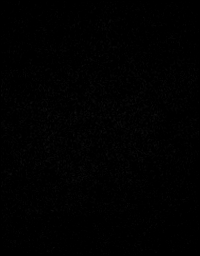

[Series 11: T2 · coronal · 5.0mm · 0.43mm/px · 2 of 31 slices shown (3 of 3)]
[im 1/31]
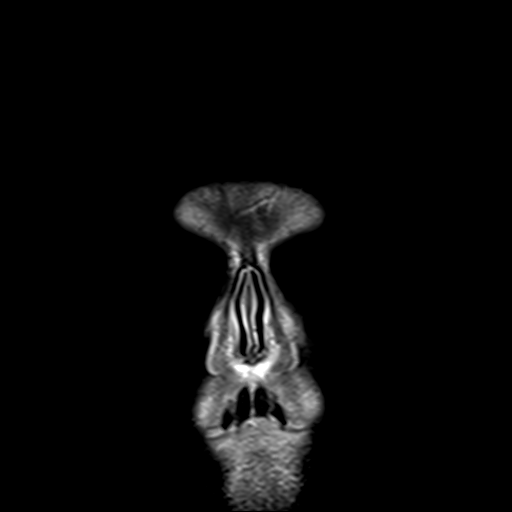
[im 31/31]
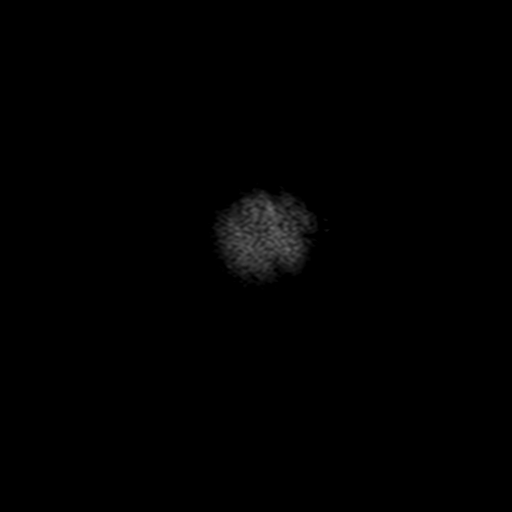

[48 of 48 positions shown; findings below may reference images not displayed]

FINDINGS: Brain:

Mild generalized cerebral and cerebellar atrophy.

Mild multifocal T2/FLAIR hyperintensity within the cerebral white
matter, nonspecific but compatible with chronic small vessel
ischemic disease.

The 2-3 mm suspected microadenoma within the left aspect of the
pituitary gland on the prior brain MRI of [DATE] is poorly
reassessed on this noncontrast examination.

Small chronic infarct within the left cerebellar hemisphere, a new
finding as compared to the prior brain MRI.

There is no acute infarct.

No chronic intracranial blood products.

No extra-axial fluid collection.

No midline shift.

Vascular: Maintained flow voids within the proximal large arterial
vessels.

Skull and upper cervical spine: No focal suspicious marrow lesion.
Incompletely assessed cervical spondylosis, more completely
evaluated on the recent prior cervical spine MRI of [DATE].

Sinuses/Orbits: Visualized orbits show no acute finding. Bilateral
lens replacements. Trace bilateral ethmoid sinus mucosal thickening.
2.3 cm left maxillary sinus mucous retention cyst.
IMPRESSION: No evidence of acute intracranial abnormality.

Mild chronic small-vessel ischemic changes within the cerebral white
matter, progressed from the brain MRI of [DATE].

Small chronic infarct within the left cerebellar hemisphere, new
from the prior MRI.

The suspected 2-3 mm microadenoma within the left aspect of the
pituitary gland on the prior MRI is poorly reassessed on this
non-contrast examination.

Mild generalized parenchymal atrophy, progressed.

Incompletely assessed cervical spondylosis. Please refer to the
recent prior cervical spine MRI of [DATE] for further
description.

Paranasal sinus disease, as described.

## 2020-12-08 ENCOUNTER — Other Ambulatory Visit: Payer: Self-pay | Admitting: Neurosurgery

## 2020-12-10 ENCOUNTER — Other Ambulatory Visit: Payer: Self-pay | Admitting: Neurosurgery

## 2020-12-10 DIAGNOSIS — G959 Disease of spinal cord, unspecified: Secondary | ICD-10-CM

## 2020-12-15 ENCOUNTER — Ambulatory Visit: Payer: Medicare Other

## 2020-12-16 ENCOUNTER — Ambulatory Visit
Admission: RE | Admit: 2020-12-16 | Discharge: 2020-12-16 | Disposition: A | Discharge status: Home / Self Care | Payer: Medicare Other | Source: Ambulatory Visit | Attending: Neurosurgery | Admitting: Neurosurgery

## 2020-12-16 ENCOUNTER — Other Ambulatory Visit: Payer: Self-pay

## 2020-12-16 DIAGNOSIS — G959 Disease of spinal cord, unspecified: Secondary | ICD-10-CM | POA: Diagnosis not present

## 2020-12-16 IMAGING — CT CT CERVICAL SPINE W/O CM
2 series · 10 of 14 positions shown, 12 images · non-contrast
Comparison: MRI cervical spine [DATE]

CLINICAL DATA: Left arm weakness with decreased use 3 months. Preop
surgery.

EXAM:
CT CERVICAL SPINE WITHOUT CONTRAST
TECHNIQUE: Multidetector CT imaging of the cervical spine was performed without
intravenous contrast. Multiplanar CT image reconstructions were also
generated.

[Series 5: c spine soft · axial · 0.31mm/px · z∈[-304,-168]mm · 5 of 104 slices shown]
[im 18/104  soft-tissue]
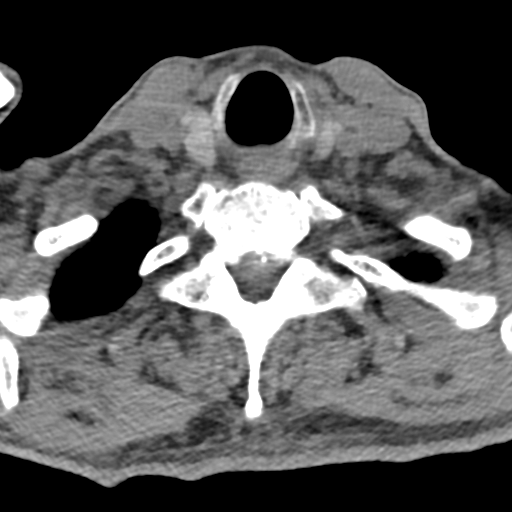
[im 35/104  soft-tissue]
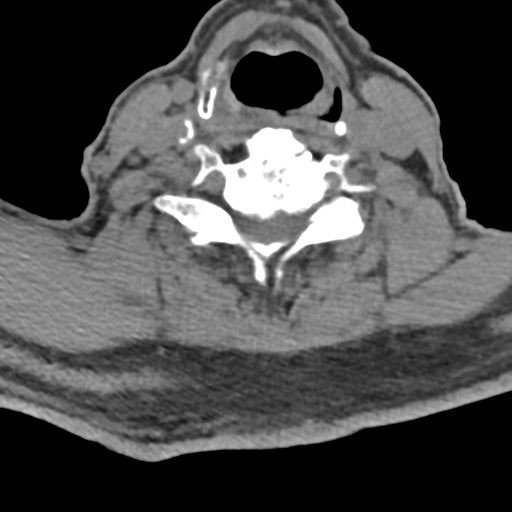
[im 52/104  soft-tissue]
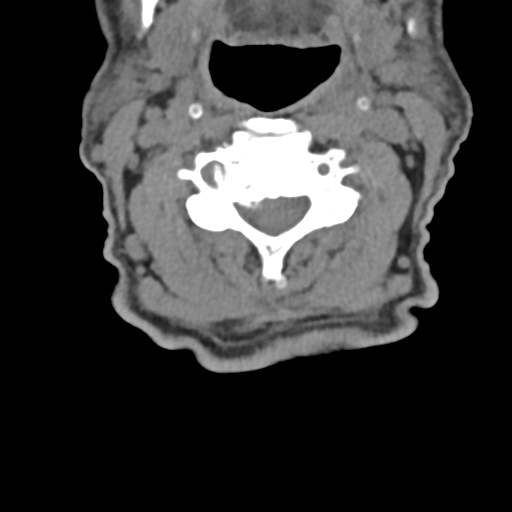
[im 69/104  soft-tissue]
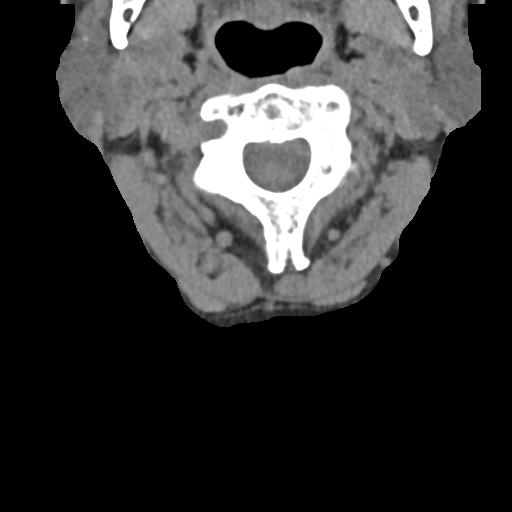
[im 86/104  soft-tissue]
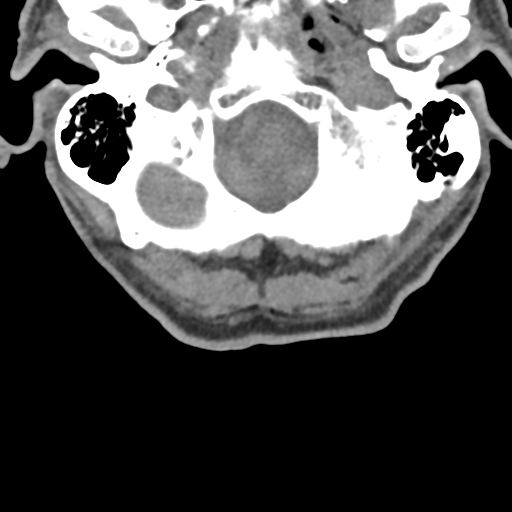

[Series 6: orthogonal bone · axial · 0.25mm/px · z∈[-342,-194]mm · 5 of 118 slices shown, 7 images]
[im 20/118  soft-tissue]
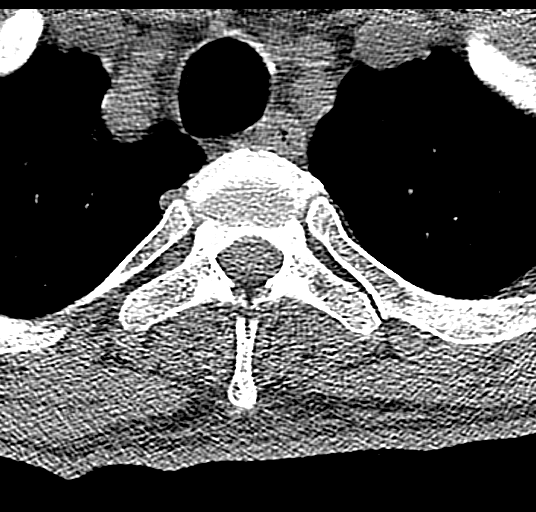
[im 20/118  bone]
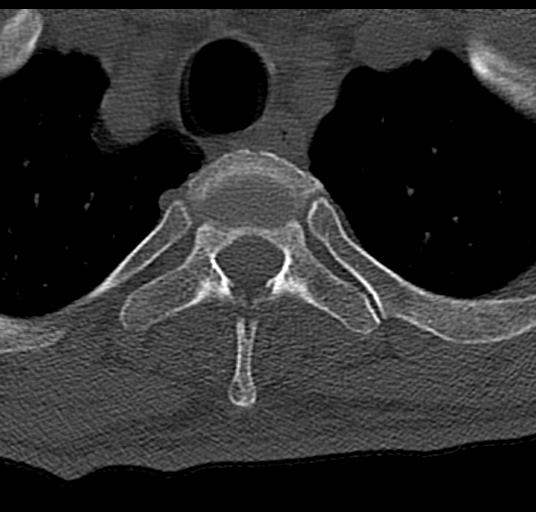
[im 40/118  bone]
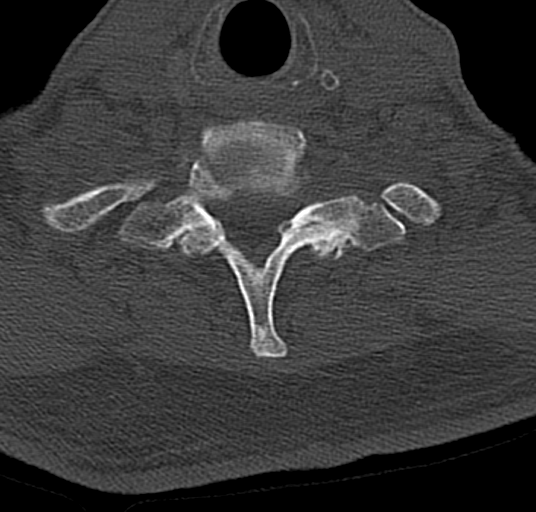
[im 59/118  bone]
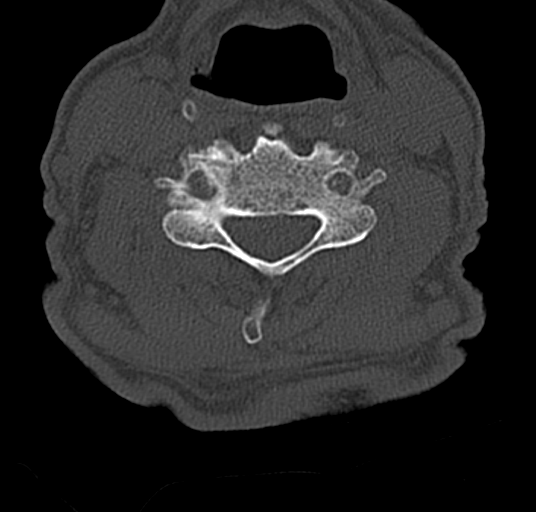
[im 79/118  bone]
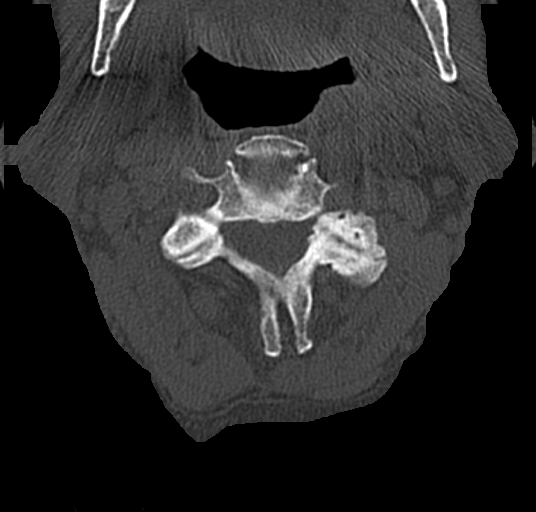
[im 98/118  soft-tissue]
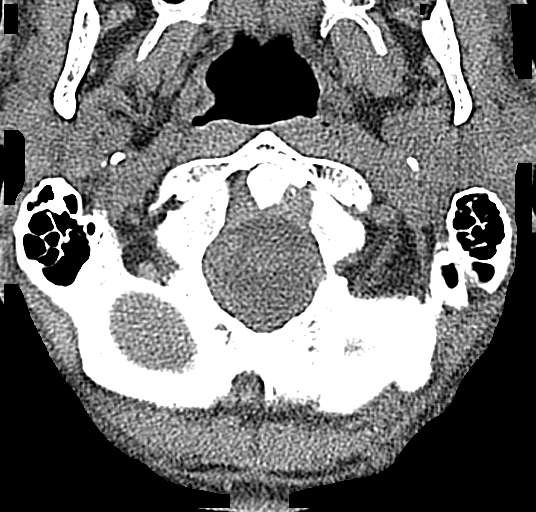
[im 98/118  bone]
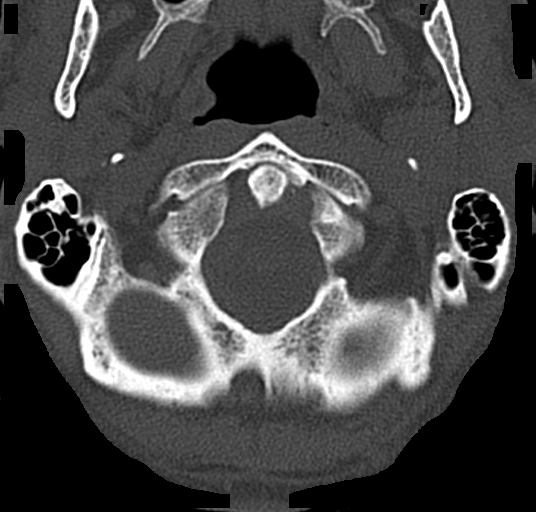

[10 of 14 positions shown; findings below may reference images not displayed]

FINDINGS: Alignment: Vertebral body alignment is within normal.

Skull base and vertebrae: Vertebral body heights are maintained.
There is moderate spondylosis throughout the cervical spine. There
is moderate uncovertebral joint spurring and facet arthropathy.
Minimal left-sided neural foraminal narrowing at the C2-3 level with
significant bilateral neural foraminal narrowing from the C3-4 level
to the C6-7 level.

Soft tissues and spinal canal: Minimal decreased AP diameter of the
right-side of the spinal canal at the C4-5 level with mild to
moderate canal stenosis at the C5-6 and C6-7 levels. Prevertebral
soft tissues are normal.

Disc levels: Moderate disc space narrowing from the C3-4 level to
the C6-7 level.

Upper chest: No acute findings.

Other: None.
IMPRESSION: 1. No acute findings.
2. Moderate spondylosis throughout the cervical spine with moderate
multilevel disc disease and significant bilateral neural foraminal
narrowing as described from the C3-4 level to the C6-7 level. Mild
to moderate canal stenosis at the C5-6 and C6-7 levels.

## 2020-12-20 ENCOUNTER — Encounter
Admission: RE | Admit: 2020-12-20 | Discharge: 2020-12-20 | Disposition: A | Discharge status: Home / Self Care | Payer: Medicare Other | Source: Ambulatory Visit | Attending: Neurosurgery | Admitting: Neurosurgery

## 2020-12-20 ENCOUNTER — Other Ambulatory Visit: Payer: Self-pay

## 2020-12-20 DIAGNOSIS — Z01818 Encounter for other preprocedural examination: Secondary | ICD-10-CM | POA: Insufficient documentation

## 2020-12-20 DIAGNOSIS — I10 Essential (primary) hypertension: Secondary | ICD-10-CM | POA: Diagnosis not present

## 2020-12-20 DIAGNOSIS — E119 Type 2 diabetes mellitus without complications: Secondary | ICD-10-CM | POA: Insufficient documentation

## 2020-12-20 DIAGNOSIS — Z01810 Encounter for preprocedural cardiovascular examination: Secondary | ICD-10-CM | POA: Diagnosis not present

## 2020-12-20 HISTORY — DX: Pneumonia, unspecified organism: J18.9

## 2020-12-20 LAB — URINALYSIS, ROUTINE W REFLEX MICROSCOPIC
Bilirubin Urine: NEGATIVE
Glucose, UA: NEGATIVE mg/dL
Hgb urine dipstick: NEGATIVE
Ketones, ur: NEGATIVE mg/dL
Leukocytes,Ua: NEGATIVE
Nitrite: NEGATIVE
Protein, ur: NEGATIVE mg/dL
Specific Gravity, Urine: 1.009 (ref 1.005–1.030)
pH: 5 (ref 5.0–8.0)

## 2020-12-20 LAB — TYPE AND SCREEN
ABO/RH(D): O POS
Antibody Screen: NEGATIVE

## 2020-12-20 LAB — CBC
HCT: 39.7 % (ref 39.0–52.0)
Hemoglobin: 13.2 g/dL (ref 13.0–17.0)
MCH: 31 pg (ref 26.0–34.0)
MCHC: 33.2 g/dL (ref 30.0–36.0)
MCV: 93.2 fL (ref 80.0–100.0)
Platelets: 227 10*3/uL (ref 150–400)
RBC: 4.26 MIL/uL (ref 4.22–5.81)
RDW: 13.7 % (ref 11.5–15.5)
WBC: 7.3 10*3/uL (ref 4.0–10.5)
nRBC: 0 % (ref 0.0–0.2)

## 2020-12-20 LAB — BASIC METABOLIC PANEL
Anion gap: 7 (ref 5–15)
BUN: 21 mg/dL (ref 8–23)
CO2: 26 mmol/L (ref 22–32)
Calcium: 9.3 mg/dL (ref 8.9–10.3)
Chloride: 107 mmol/L (ref 98–111)
Creatinine, Ser: 1.01 mg/dL (ref 0.61–1.24)
GFR, Estimated: >60 mL/min (ref >60)
Glucose, Bld: 93 mg/dL (ref 70–99)
Potassium: 4.6 mmol/L (ref 3.5–5.1)
Sodium: 140 mmol/L (ref 135–145)

## 2020-12-20 LAB — SURGICAL PCR SCREEN
MRSA, PCR: NEGATIVE
Staphylococcus aureus: NEGATIVE

## 2020-12-20 LAB — PROTIME-INR
INR: 2.7 — ABNORMAL HIGH (ref 0.8–1.2)
Prothrombin Time: 28.5 seconds — ABNORMAL HIGH (ref 11.4–15.2)

## 2020-12-20 LAB — APTT: aPTT: 94 seconds — ABNORMAL HIGH (ref 24–36)

## 2020-12-20 NOTE — Patient Instructions (Addendum)
Your procedure is scheduled on: Monday, November 7 Report to the Registration Desk on the 1st floor of the [facility]. To find out your arrival time, please call [phone] between 1PM - 3PM on: Friday, November 4  REMEMBER: Instructions that are not followed completely may result in serious medical risk, up to and including death; or upon the discretion of your surgeon and anesthesiologist your surgery may need to be rescheduled.  Do not eat food after midnight the night before surgery.  No gum chewing, lozengers or hard candies.  You may however, drink water up to 2 hours before you are scheduled to arrive for your surgery. Do not drink anything within 2 hours of your scheduled arrival time.  TAKE THESE MEDICATIONS THE MORNING OF SURGERY WITH A SIP OF WATER:  Levetiracetam (Keppra) Pantoprazole (Protonix) - (take one the night before and one on the morning of surgery - helps to prevent nausea after surgery.)  Stop Metformin 2 days prior to surgery. Last day to take Metformin is Friday, November 4. Resume AFTER surgery.  Follow recommendations from Cardiologist, Pulmonologist or PCP regarding stopping Aspirin and Xarelto. Stop aspirin 1 week prior to surgery. Last day to take aspirin is October 30. Resume 1 week after. May start aspirin again on November 15. Stop Xarelto 3 days prior to surgery. Last day to take Xarelto is Thursday, November 3.  Begin taking Lovenox on Friday, November 4. Take Lovenox Saturday, November 5 and Sunday, November 6. Do not take on the day of surgery. Blood thinners will be resumed AFTER surgery per surgeon instruction.  One week prior to surgery: starting October 31 Stop Anti-inflammatories (NSAIDS) such as Advil, Aleve, Ibuprofen, Motrin, Naproxen, Naprosyn and Aspirin based products such as Excedrin, Goodys Powder, BC Powder. Stop ANY OVER THE COUNTER supplements until after surgery. Stop Fish oil, CoQ-10, acidopilus. You may however, continue to  take Tylenol if needed for pain up until the day of surgery.  No Alcohol for 24 hours before or after surgery.  On the morning of surgery brush your teeth with toothpaste and water, you may rinse your mouth with mouthwash if you wish. Do not swallow any toothpaste or mouthwash.  Use CHG Soap as directed on instruction sheet.  Do not wear jewelry  Do not wear lotions, powders, or perfumes.   Do not shave body from the neck down 48 hours prior to surgery just in case you cut yourself which could leave a site for infection.  Also, freshly shaved skin may become irritated if using the CHG soap.  Do not bring valuables to the hospital. [facility] is not responsible for any missing/lost belongings or valuables.   Notify your doctor if there is any change in your medical condition (cold, fever, infection).  Wear comfortable clothing (specific to your surgery type) to the hospital.  After surgery, you can help prevent lung complications by doing breathing exercises.  Take deep breaths and cough every 1-2 hours. Your doctor may order a device called an Incentive Spirometer to help you take deep breaths.  If you are being admitted to the hospital overnight, leave your suitcase in the car. After surgery it may be brought to your room.  Please call the [place] [facility] at [phone] if you have any questions about these instructions.  Surgery Visitation Policy:  Patients undergoing a surgery or procedure may have one family member or support person with them as long as that person is not COVID-19 positive or experiencing its  symptoms.  That person may remain in the waiting area during the procedure and may rotate out with other people.  Inpatient Visitation:    Visiting hours are 7 a.m. to 8 p.m. Up to two visitors ages 16+ are allowed at one time in a patient room. The visitors may rotate out with other people during the day. Visitors must check out when they leave, or  other visitors will not be allowed. One designated support person may remain overnight. The visitor must pass COVID-19 screenings, use hand sanitizer when entering and exiting the patient's room and wear a mask at all times, including in the patient's room. Patients must also wear a mask when staff or their visitor are in the room. Masking is required regardless of vaccination status.

## 2020-12-24 ENCOUNTER — Other Ambulatory Visit: Payer: Self-pay

## 2020-12-24 ENCOUNTER — Other Ambulatory Visit
Admission: RE | Admit: 2020-12-24 | Discharge: 2020-12-24 | Disposition: A | Discharge status: Home / Self Care | Payer: Medicare Other | Source: Ambulatory Visit | Attending: Neurosurgery | Admitting: Neurosurgery

## 2020-12-24 DIAGNOSIS — Z20822 Contact with and (suspected) exposure to covid-19: Secondary | ICD-10-CM | POA: Insufficient documentation

## 2020-12-24 DIAGNOSIS — Z01812 Encounter for preprocedural laboratory examination: Secondary | ICD-10-CM | POA: Insufficient documentation

## 2020-12-24 LAB — SARS CORONAVIRUS 2 (TAT 6-24 HRS): SARS Coronavirus 2: NEGATIVE

## 2020-12-27 ENCOUNTER — Ambulatory Visit: Payer: Medicare Other

## 2020-12-27 ENCOUNTER — Encounter
Admission: RE | Disposition: A | Discharge status: Home / Self Care | Payer: Self-pay | Source: Home / Self Care | Attending: Neurosurgery

## 2020-12-27 ENCOUNTER — Encounter: Payer: Self-pay | Admitting: Neurosurgery

## 2020-12-27 ENCOUNTER — Observation Stay
Admission: RE | Admit: 2020-12-27 | Discharge: 2020-12-28 | Disposition: A | Discharge status: Home / Self Care | Payer: Medicare Other | Attending: Neurosurgery | Admitting: Neurosurgery

## 2020-12-27 ENCOUNTER — Other Ambulatory Visit: Payer: Self-pay

## 2020-12-27 DIAGNOSIS — Z7901 Long term (current) use of anticoagulants: Secondary | ICD-10-CM | POA: Diagnosis not present

## 2020-12-27 DIAGNOSIS — Z7984 Long term (current) use of oral hypoglycemic drugs: Secondary | ICD-10-CM | POA: Diagnosis not present

## 2020-12-27 DIAGNOSIS — Z86711 Personal history of pulmonary embolism: Secondary | ICD-10-CM | POA: Diagnosis not present

## 2020-12-27 DIAGNOSIS — I10 Essential (primary) hypertension: Secondary | ICD-10-CM | POA: Diagnosis not present

## 2020-12-27 DIAGNOSIS — M4802 Spinal stenosis, cervical region: Principal | ICD-10-CM | POA: Insufficient documentation

## 2020-12-27 DIAGNOSIS — Z86718 Personal history of other venous thrombosis and embolism: Secondary | ICD-10-CM | POA: Diagnosis not present

## 2020-12-27 DIAGNOSIS — M50021 Cervical disc disorder at C4-C5 level with myelopathy: Secondary | ICD-10-CM | POA: Insufficient documentation

## 2020-12-27 DIAGNOSIS — Z79899 Other long term (current) drug therapy: Secondary | ICD-10-CM | POA: Insufficient documentation

## 2020-12-27 DIAGNOSIS — E119 Type 2 diabetes mellitus without complications: Secondary | ICD-10-CM | POA: Insufficient documentation

## 2020-12-27 DIAGNOSIS — Z8679 Personal history of other diseases of the circulatory system: Secondary | ICD-10-CM | POA: Insufficient documentation

## 2020-12-27 DIAGNOSIS — M50022 Cervical disc disorder at C5-C6 level with myelopathy: Secondary | ICD-10-CM | POA: Insufficient documentation

## 2020-12-27 DIAGNOSIS — Z419 Encounter for procedure for purposes other than remedying health state, unspecified: Secondary | ICD-10-CM

## 2020-12-27 DIAGNOSIS — M5412 Radiculopathy, cervical region: Secondary | ICD-10-CM | POA: Diagnosis present

## 2020-12-27 DIAGNOSIS — M5001 Cervical disc disorder with myelopathy,  high cervical region: Secondary | ICD-10-CM | POA: Insufficient documentation

## 2020-12-27 HISTORY — DX: Type 2 diabetes mellitus without complications: E11.9

## 2020-12-27 HISTORY — PX: ANTERIOR CERVICAL DECOMP/DISCECTOMY FUSION: SHX1161

## 2020-12-27 LAB — GLUCOSE, CAPILLARY
Glucose-Capillary: 119 mg/dL — ABNORMAL HIGH (ref 70–99)
Glucose-Capillary: 147 mg/dL — ABNORMAL HIGH (ref 70–99)
Glucose-Capillary: 169 mg/dL — ABNORMAL HIGH (ref 70–99)
Glucose-Capillary: 213 mg/dL — ABNORMAL HIGH (ref 70–99)

## 2020-12-27 IMAGING — RF DG C-ARM 1-60 MIN
1 series · 2 of 2 positions shown · non-contrast
Comparison: None.

CLINICAL DATA: surgery

EXAM:
DG C-ARM 1-60 MIN
FLUOROSCOPY TIME:  Fluoroscopy Time:  8 seconds
Radiation Exposure Index (if provided by the fluoroscopic device):
0.1904 mGy
Number of Acquired Spot Images: 2

[Series 1: dg x-ray · 0.20mm/px · 2 of 2 slices shown]
[im 1/2]
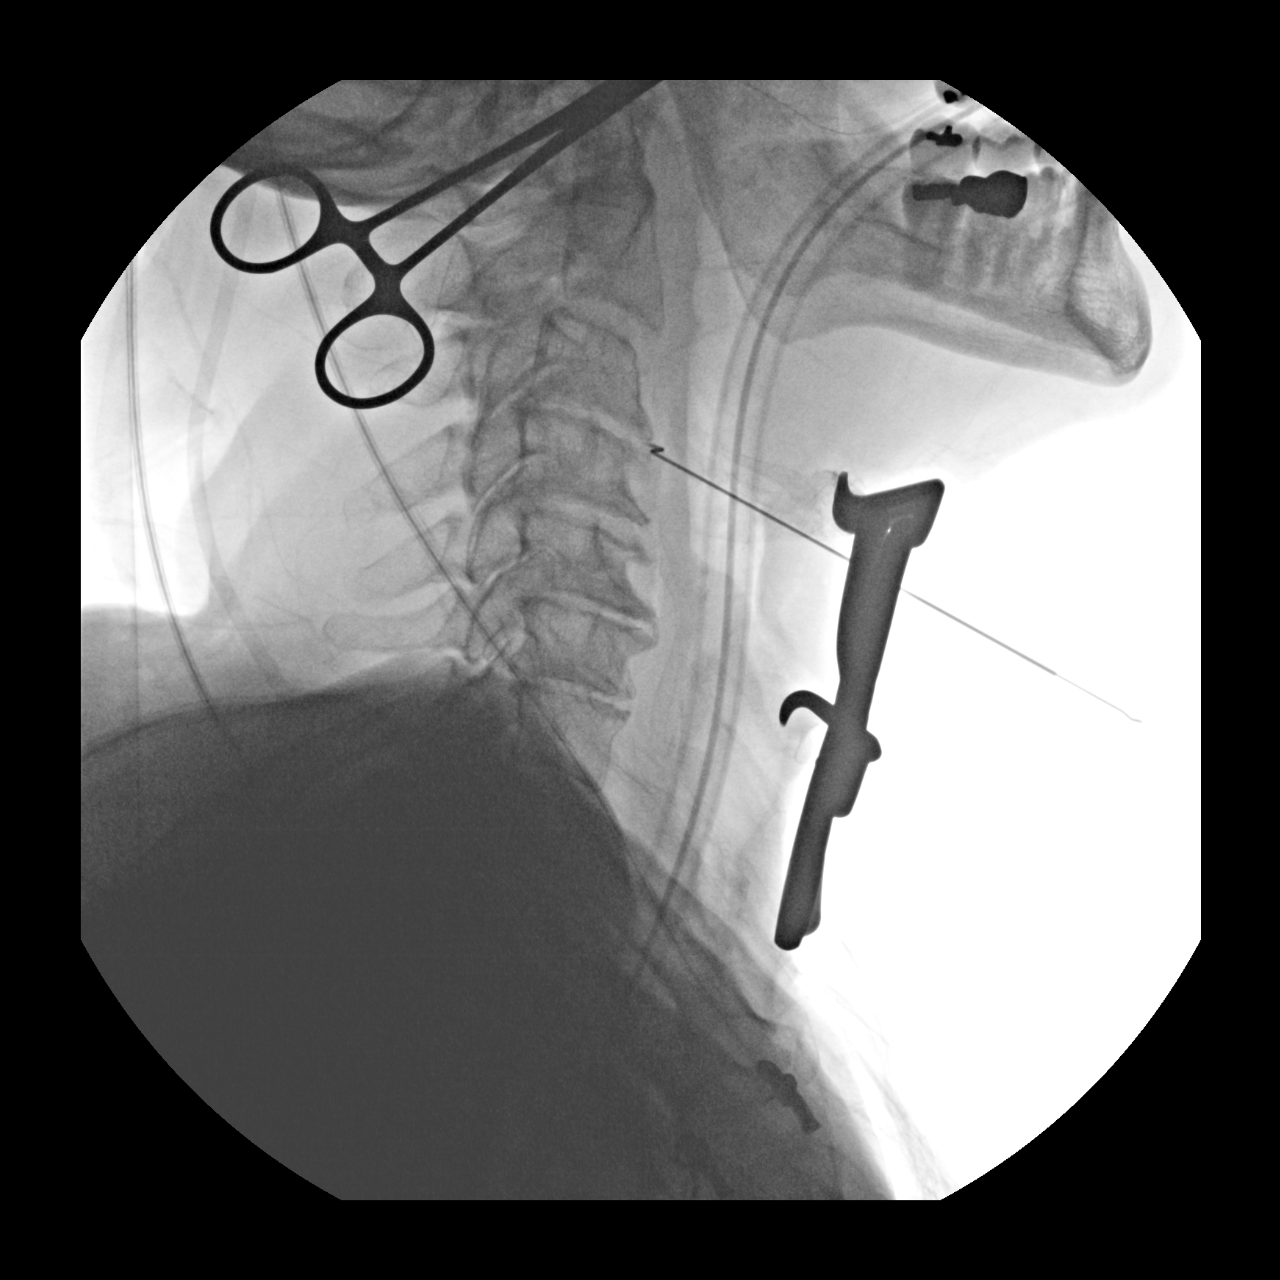
[im 2/2]
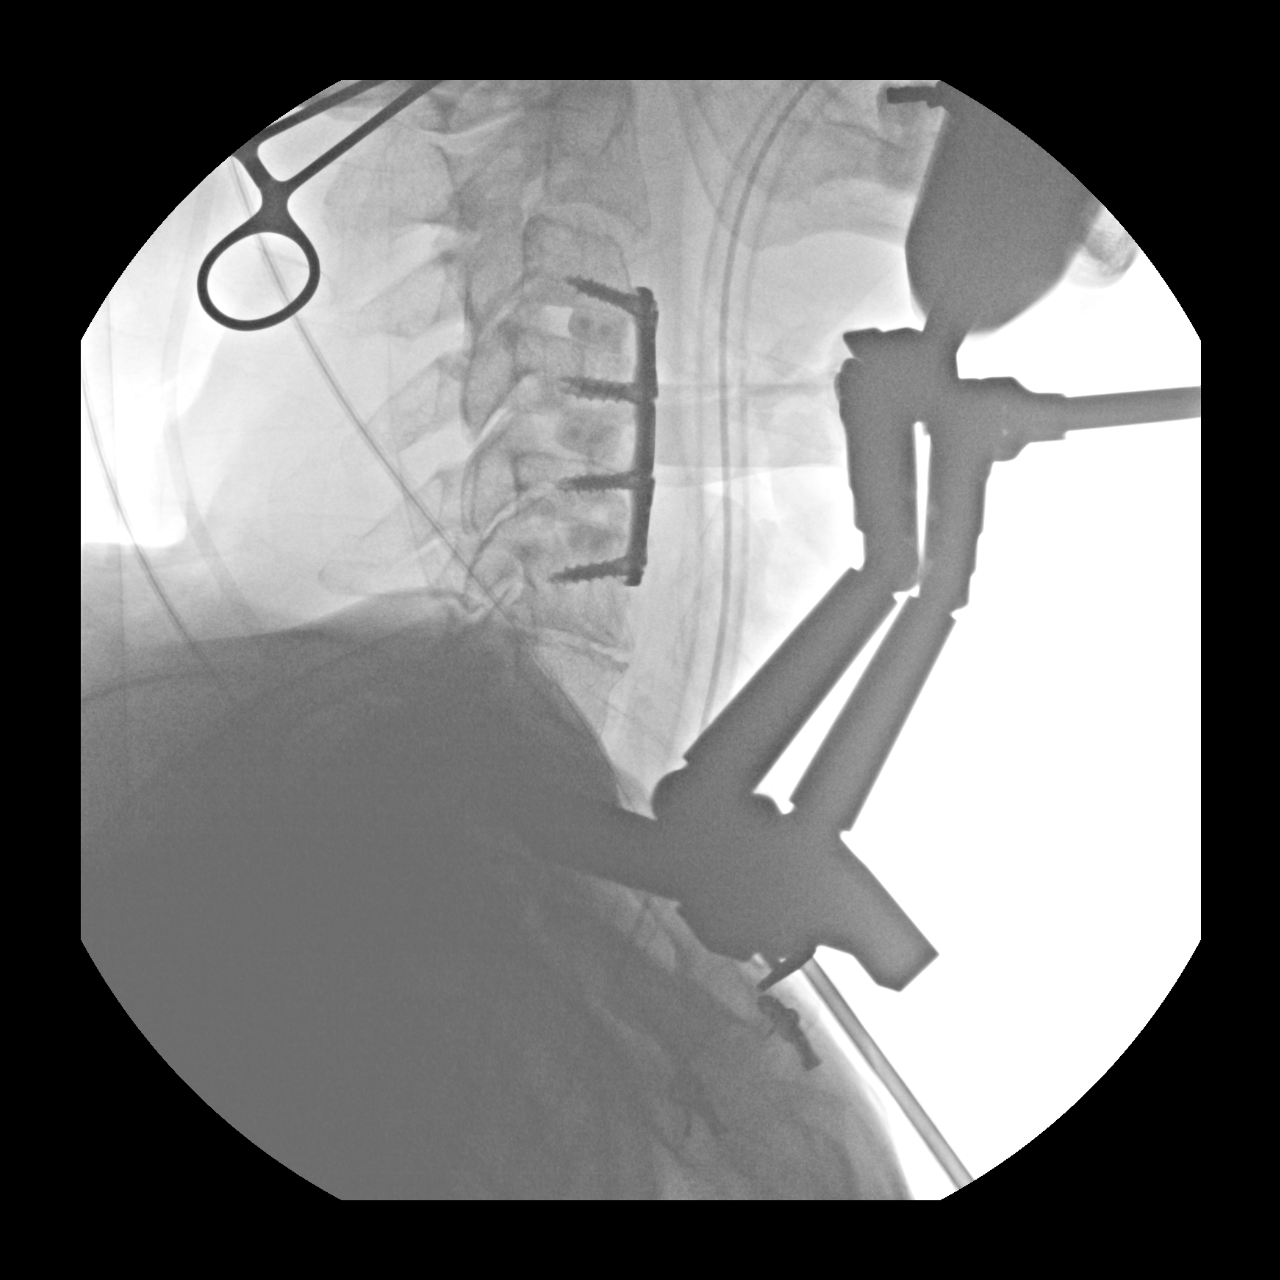

[2 of 2 positions shown; findings below may reference images not displayed]

FINDINGS: Two C-arm fluoroscopic images were obtained intraoperatively and
submitted for post operative interpretation. The first image
demonstrates surgical needle with the tip projecting at the anterior
C3-C4 interspace. Second image demonstrates postsurgical changes of
ACDF spanning C3-C6 with intervening spacers. Similar
anterolisthesis of C2 on C3. Please see the performing provider's
procedural report for further detail.
IMPRESSION: Intraoperative fluoroscopy, as detailed above.

## 2020-12-27 IMAGING — RF DG C-ARM 1-60 MIN
1 series · 2 of 2 positions shown · non-contrast
Comparison: None.

CLINICAL DATA: surgery

EXAM:
DG C-ARM 1-60 MIN
FLUOROSCOPY TIME:  Fluoroscopy Time:  8 seconds
Radiation Exposure Index (if provided by the fluoroscopic device):
0.1904 mGy
Number of Acquired Spot Images: 2

[Series 1: dg x-ray · 0.20mm/px · 2 of 2 slices shown]
[im 1/2]
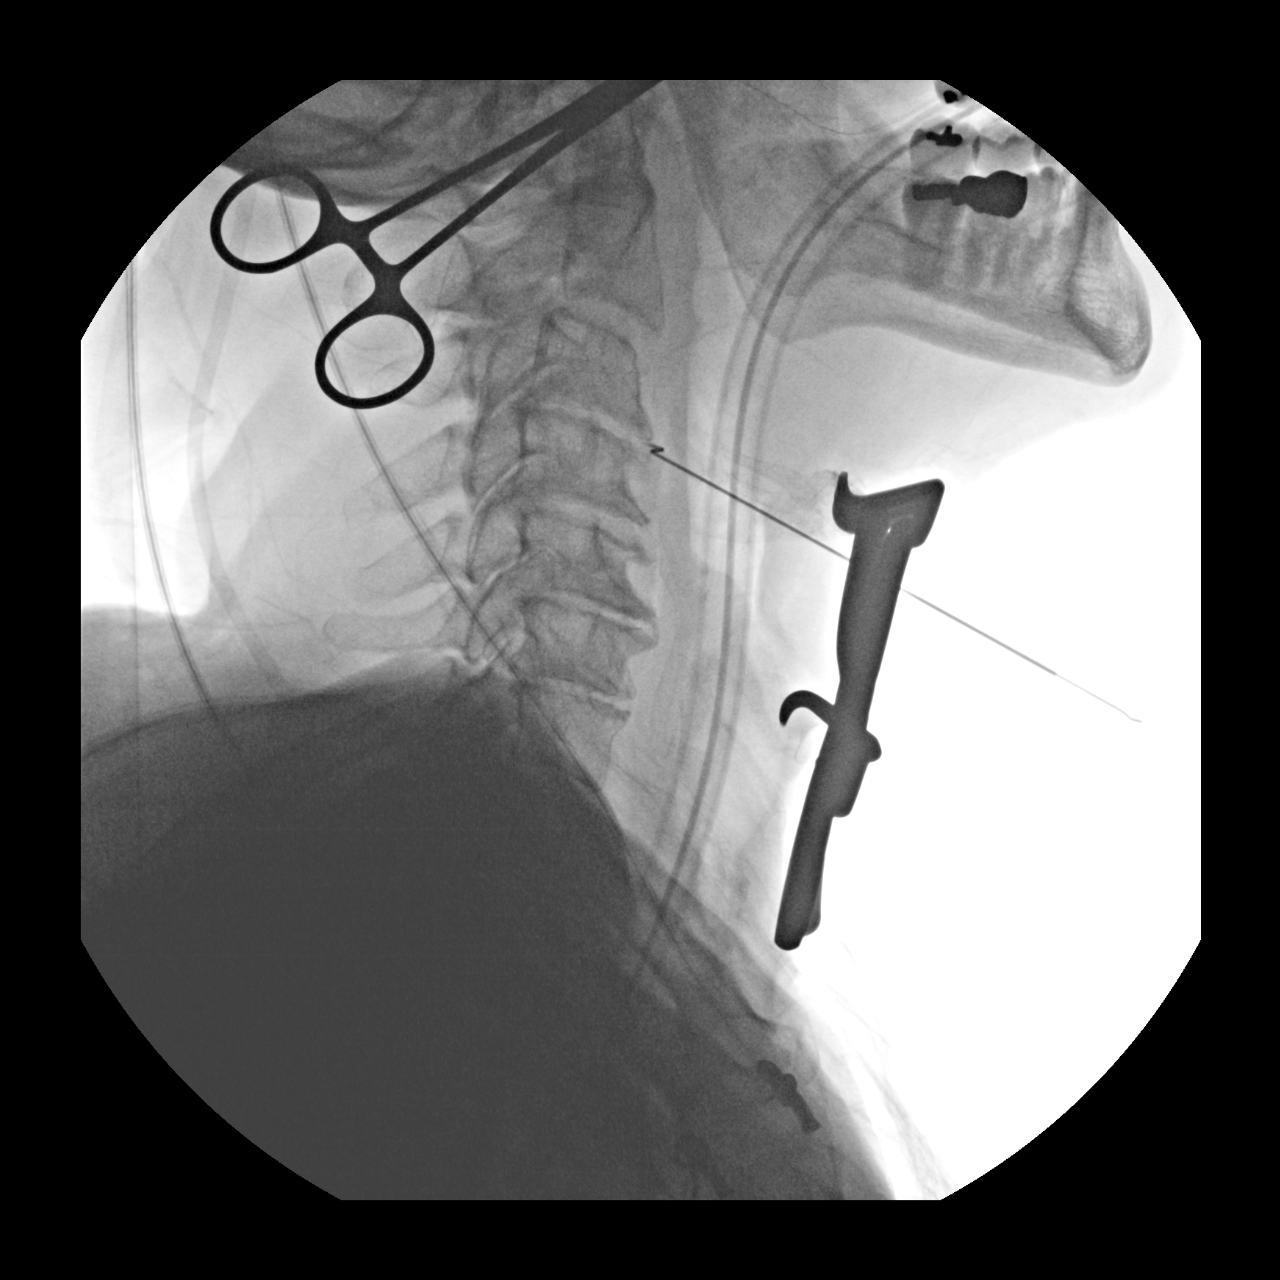
[im 2/2]
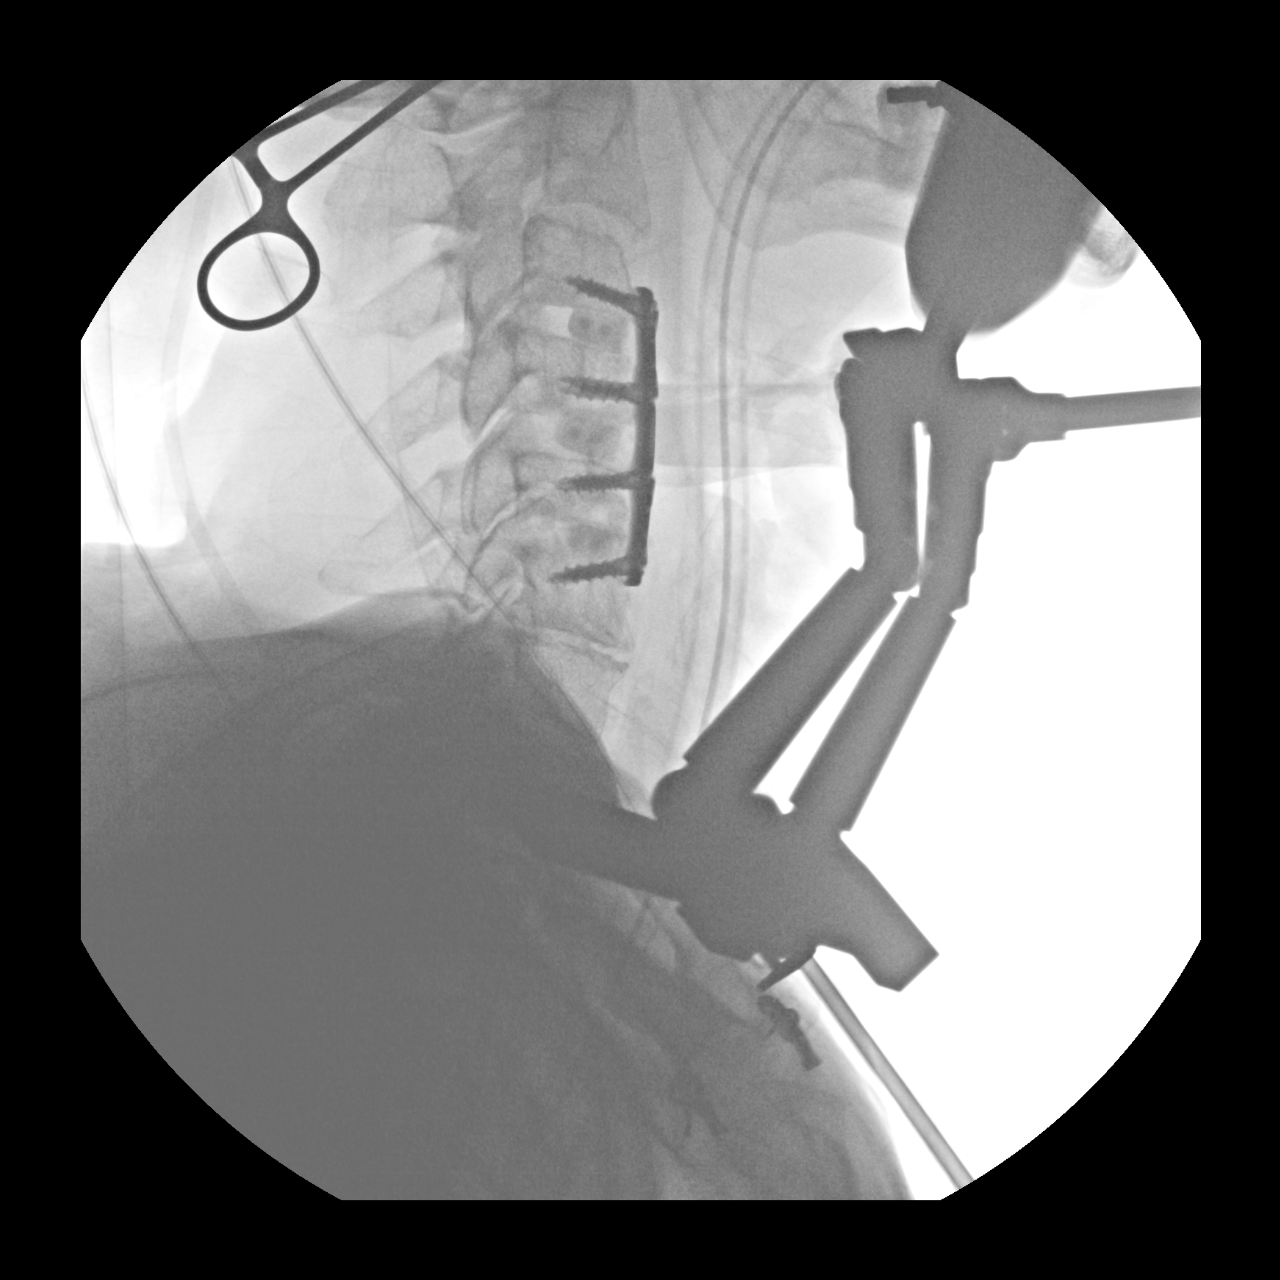

[2 of 2 positions shown; findings below may reference images not displayed]

FINDINGS: Two C-arm fluoroscopic images were obtained intraoperatively and
submitted for post operative interpretation. The first image
demonstrates surgical needle with the tip projecting at the anterior
C3-C4 interspace. Second image demonstrates postsurgical changes of
ACDF spanning C3-C6 with intervening spacers. Similar
anterolisthesis of C2 on C3. Please see the performing provider's
procedural report for further detail.
IMPRESSION: Intraoperative fluoroscopy, as detailed above.

## 2020-12-27 IMAGING — RF DG C-ARM 1-60 MIN
1 series · 2 of 2 positions shown · non-contrast
Comparison: None.

CLINICAL DATA: surgery

EXAM:
DG C-ARM 1-60 MIN
FLUOROSCOPY TIME:  Fluoroscopy Time:  8 seconds
Radiation Exposure Index (if provided by the fluoroscopic device):
0.1904 mGy
Number of Acquired Spot Images: 2

[Series 1: dg x-ray · 0.20mm/px · 2 of 2 slices shown]
[im 1/2]
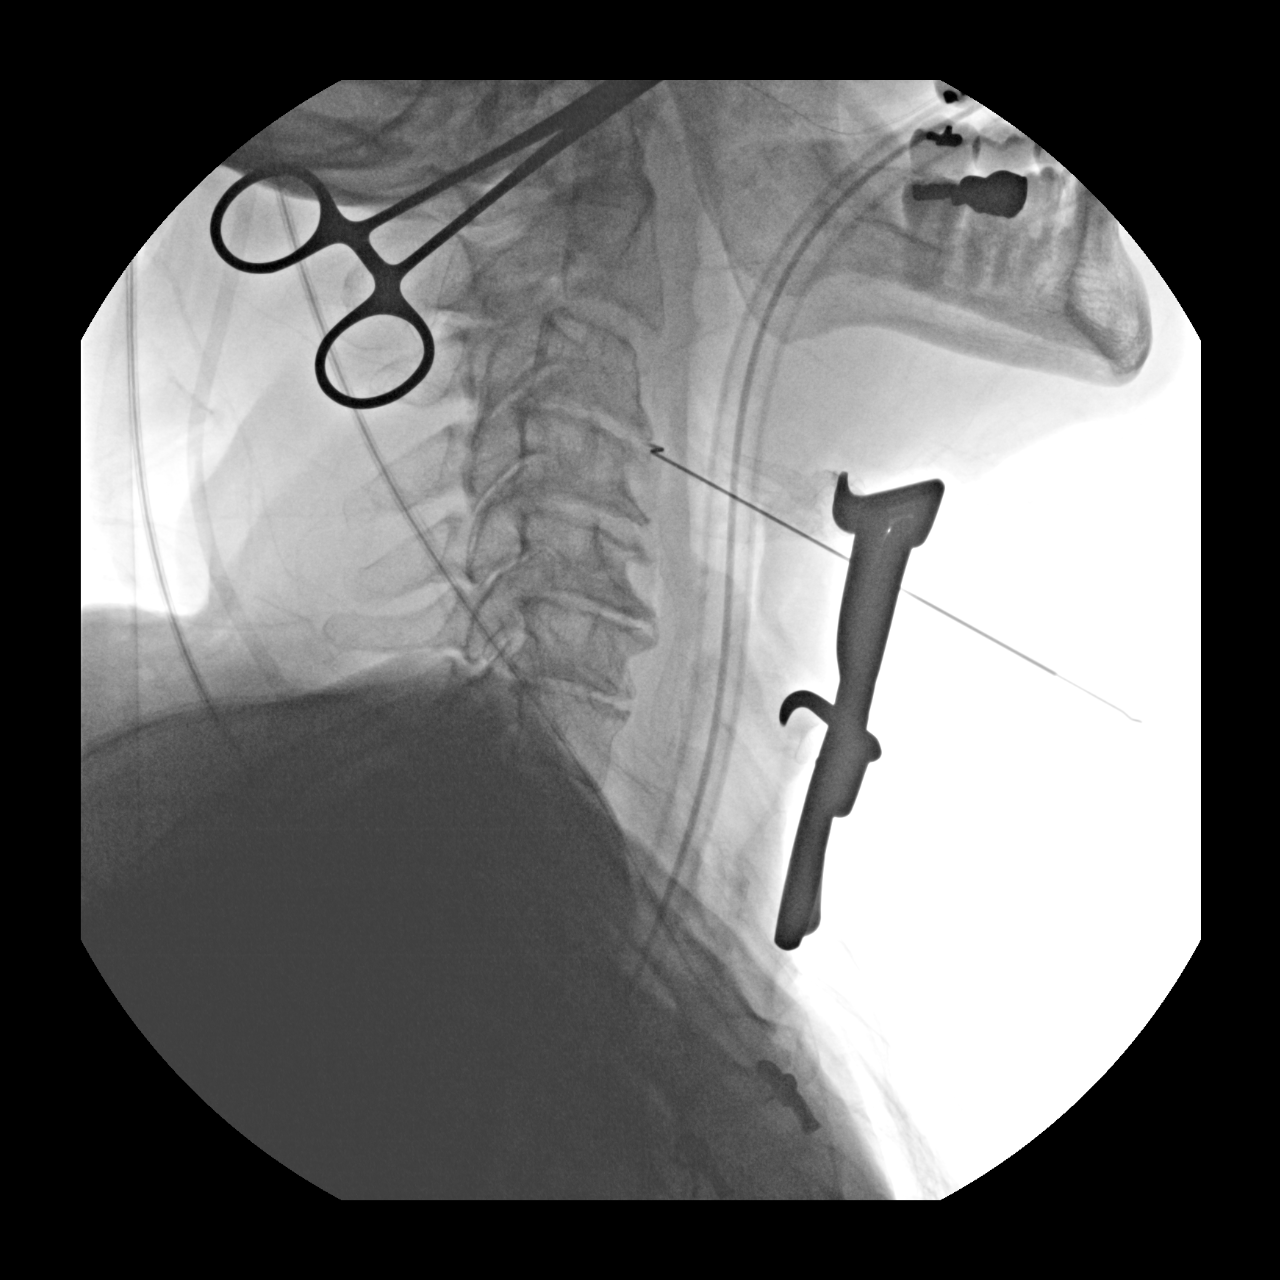
[im 2/2]
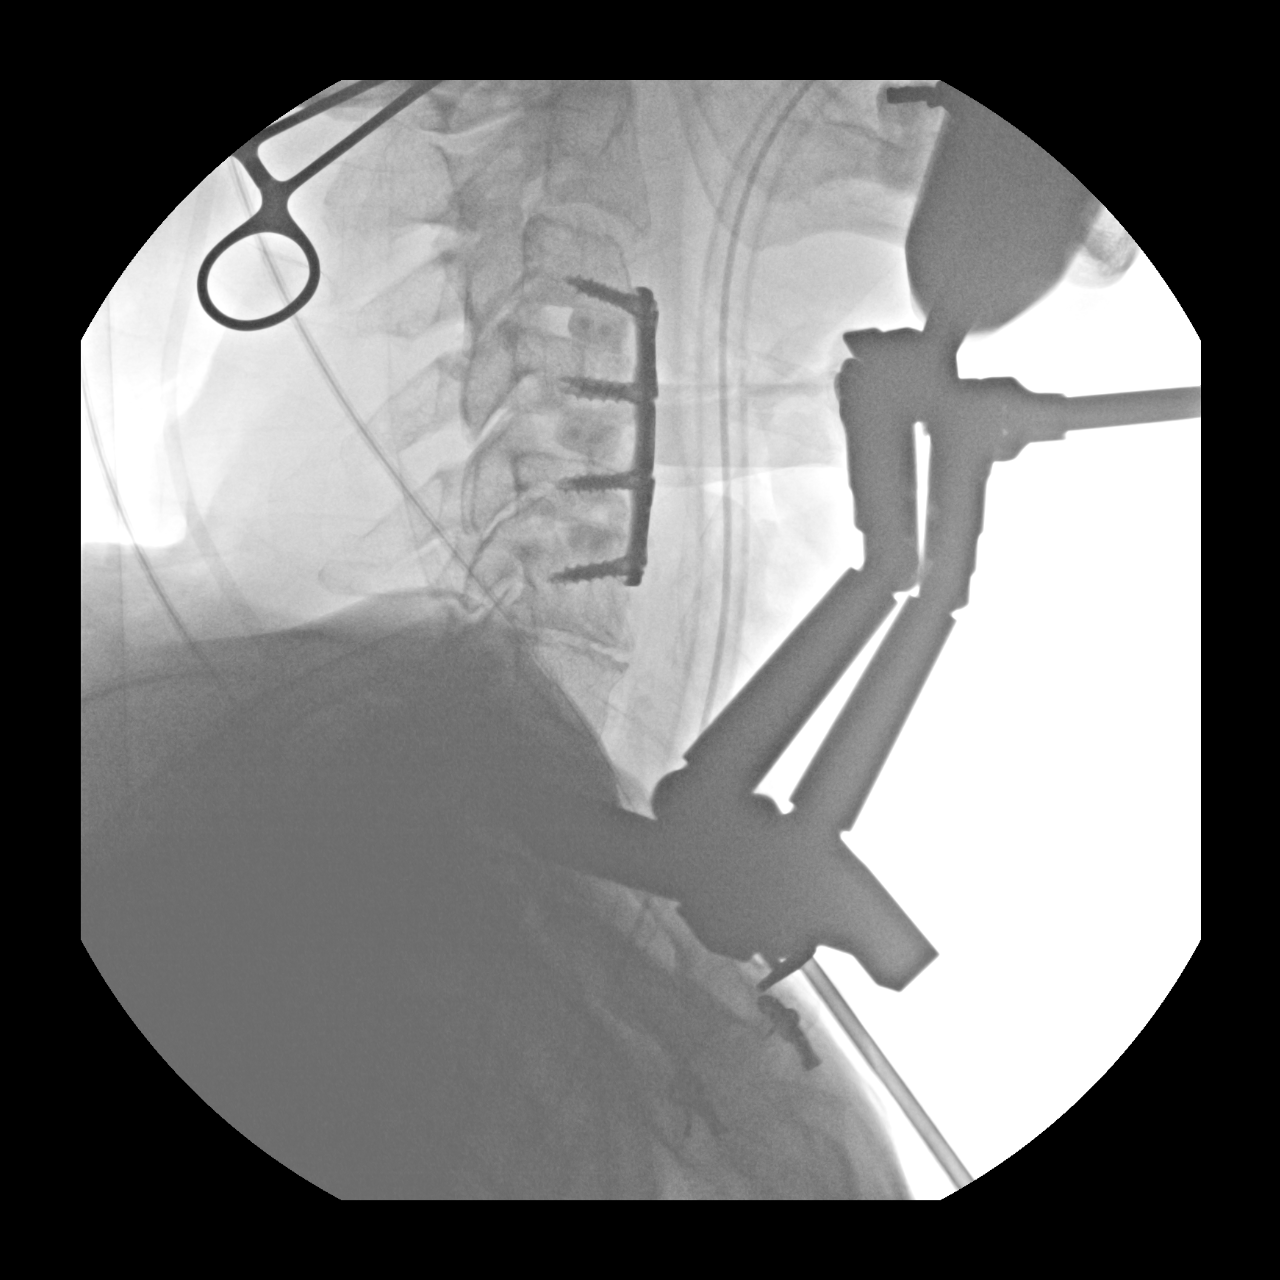

[2 of 2 positions shown; findings below may reference images not displayed]

FINDINGS: Two C-arm fluoroscopic images were obtained intraoperatively and
submitted for post operative interpretation. The first image
demonstrates surgical needle with the tip projecting at the anterior
C3-C4 interspace. Second image demonstrates postsurgical changes of
ACDF spanning C3-C6 with intervening spacers. Similar
anterolisthesis of C2 on C3. Please see the performing provider's
procedural report for further detail.
IMPRESSION: Intraoperative fluoroscopy, as detailed above.

## 2020-12-27 IMAGING — RF DG CERVICAL SPINE 1V
1 series · 2 of 2 positions shown · non-contrast
Comparison: None.

CLINICAL DATA: Neck pain

EXAM:
DG CERVICAL SPINE - 1 VIEW

[Series 1: dg x-ray · 0.20mm/px · 2 of 2 slices shown]
[im 1/2]
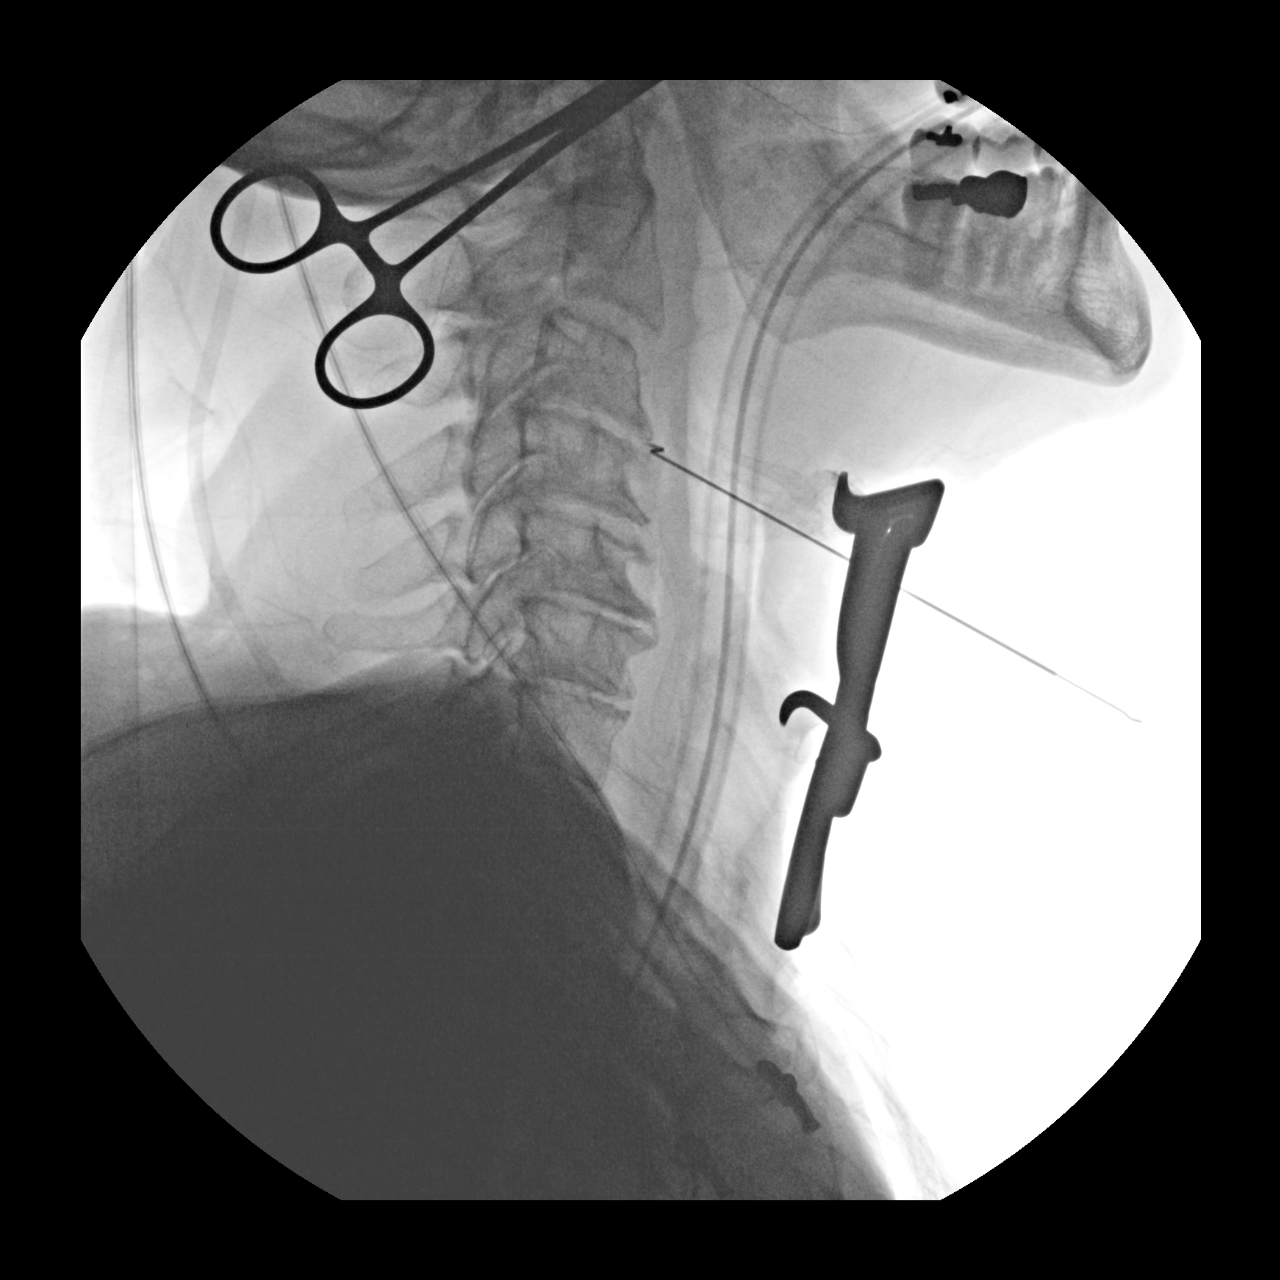
[im 2/2]
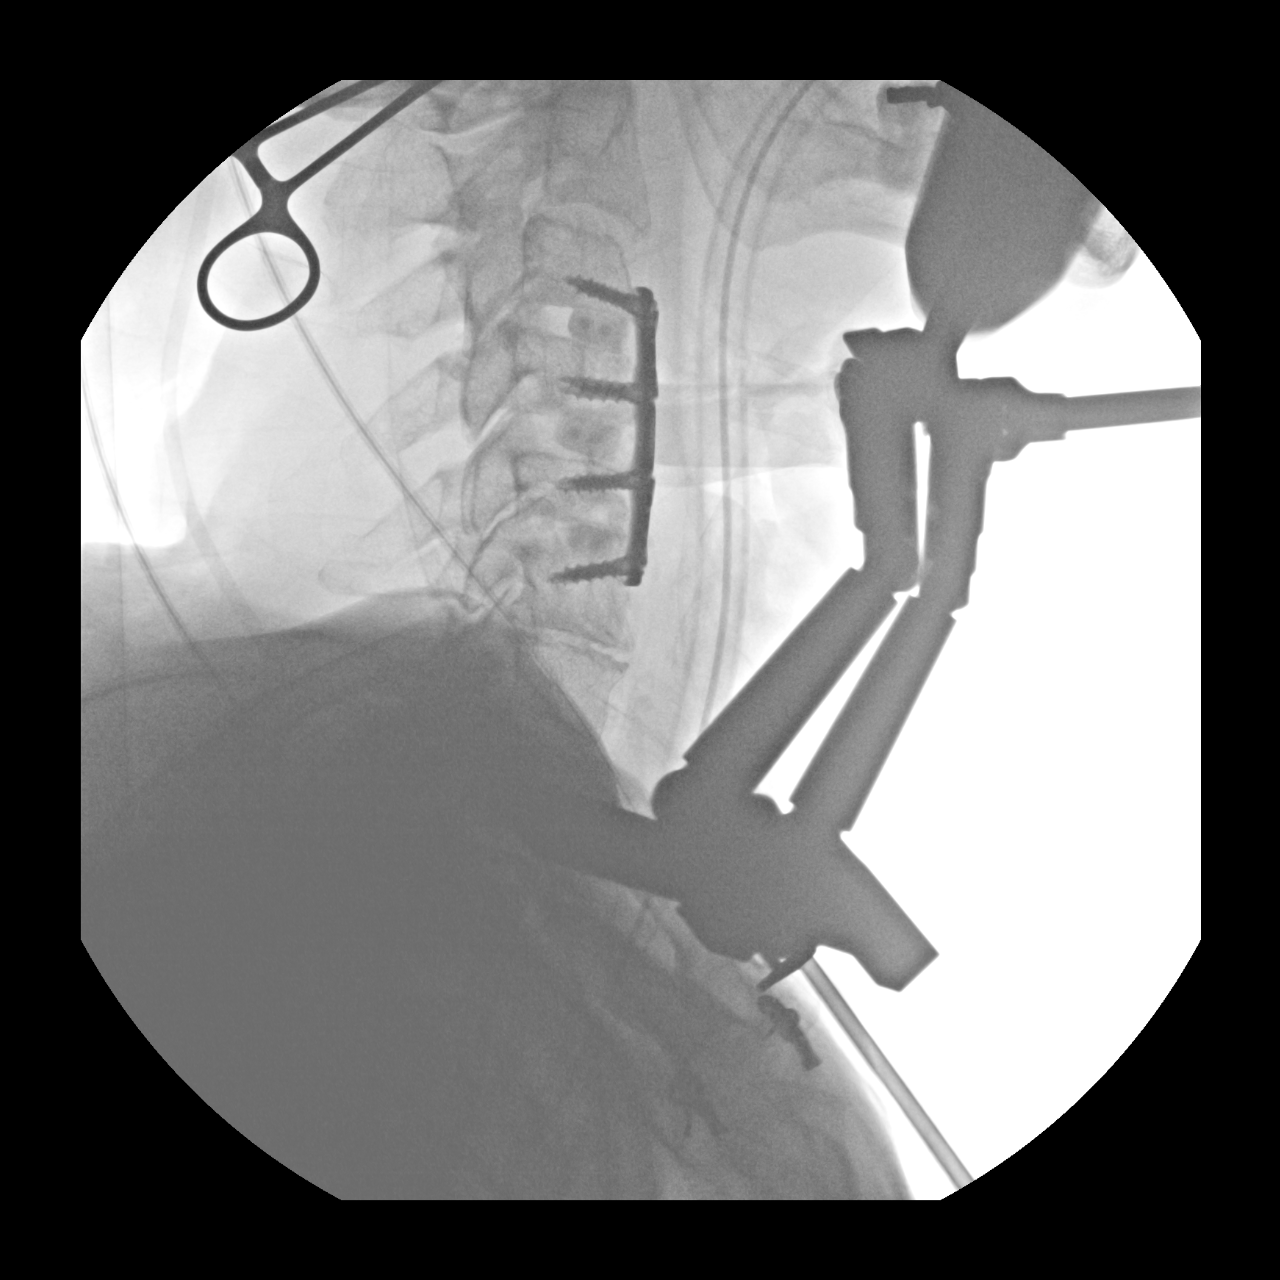

[2 of 2 positions shown; findings below may reference images not displayed]

FINDINGS: Fluoroscopic images show anterior surgical fusion from C3-C6 levels.
Intervertebral disc spaces seen. Disc space narrowing and bony spurs
are noted at C6-C7 level.
IMPRESSION: Fluoroscopic assistance was provided for anterior surgical fusion
from C3-C6 levels.

## 2020-12-27 IMAGING — RF DG C-ARM 1-60 MIN
1 series · 2 of 2 positions shown · non-contrast
Comparison: None.

CLINICAL DATA: surgery

EXAM:
DG C-ARM 1-60 MIN
FLUOROSCOPY TIME:  Fluoroscopy Time:  8 seconds
Radiation Exposure Index (if provided by the fluoroscopic device):
0.1904 mGy
Number of Acquired Spot Images: 2

[Series 1: dg x-ray · 0.20mm/px · 2 of 2 slices shown]
[im 1/2]
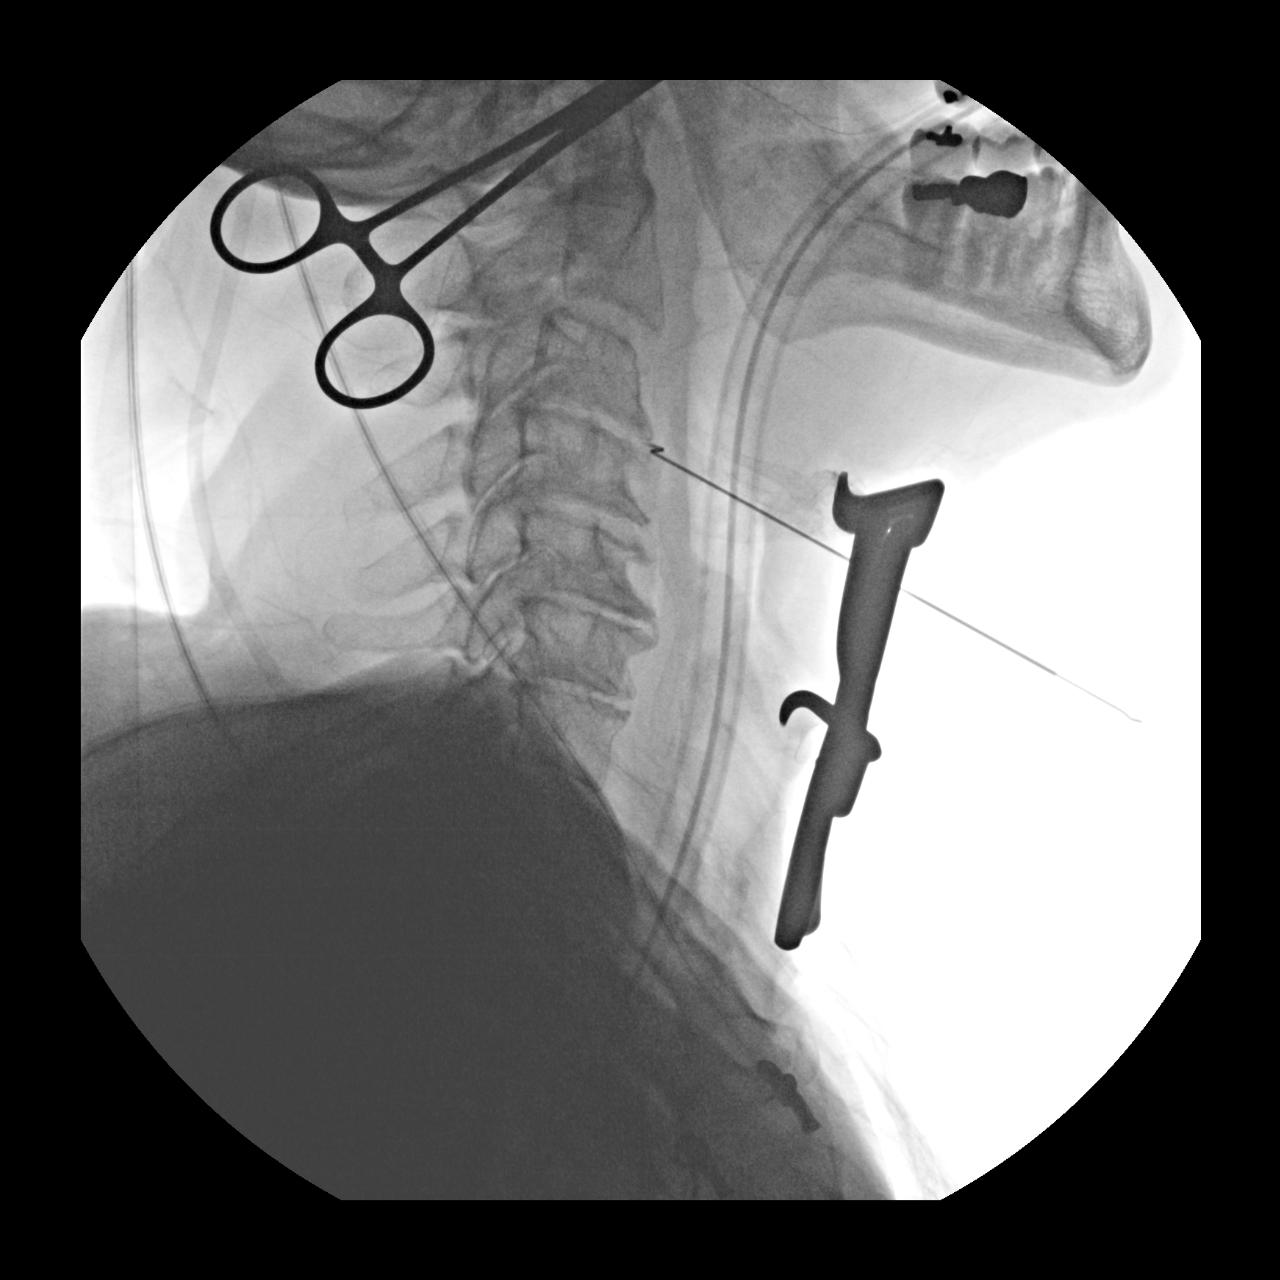
[im 2/2]
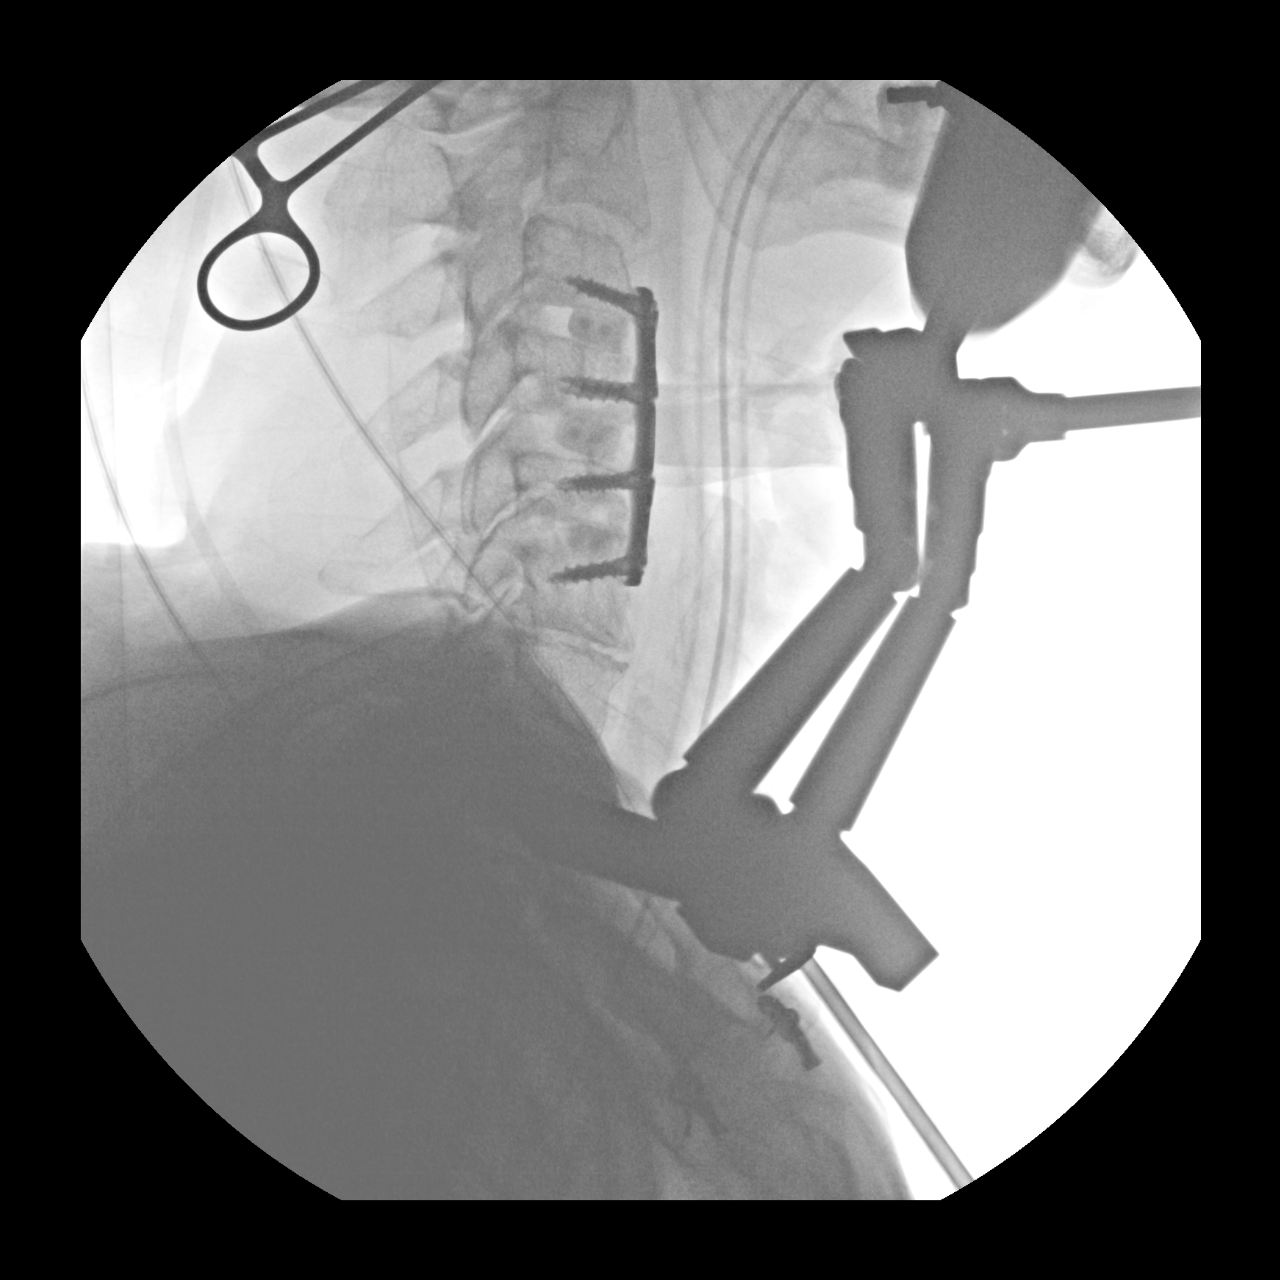

[2 of 2 positions shown; findings below may reference images not displayed]

FINDINGS: Two C-arm fluoroscopic images were obtained intraoperatively and
submitted for post operative interpretation. The first image
demonstrates surgical needle with the tip projecting at the anterior
C3-C4 interspace. Second image demonstrates postsurgical changes of
ACDF spanning C3-C6 with intervening spacers. Similar
anterolisthesis of C2 on C3. Please see the performing provider's
procedural report for further detail.
IMPRESSION: Intraoperative fluoroscopy, as detailed above.

## 2020-12-27 SURGERY — ANTERIOR CERVICAL DECOMPRESSION/DISCECTOMY FUSION 3 LEVELS
Anesthesia: General

## 2020-12-27 MED ORDER — CHLORHEXIDINE GLUCONATE 0.12 % MT SOLN
OROMUCOSAL | Status: AC
  Administered 2020-12-27: 15 mL via OROMUCOSAL
  Filled 2020-12-27: qty 15

## 2020-12-27 MED ORDER — PROPOFOL 10 MG/ML IV BOLUS
INTRAVENOUS | Status: AC
  Filled 2020-12-27: qty 40

## 2020-12-27 MED ORDER — MENTHOL 3 MG MT LOZG
1.0000 | LOZENGE | OROMUCOSAL | Status: DC | PRN
  Filled 2020-12-27: qty 9

## 2020-12-27 MED ORDER — SODIUM CHLORIDE 0.9 % IV SOLN: 250.0000 mL | INTRAVENOUS | Status: DC

## 2020-12-27 MED ORDER — PHENYLEPHRINE HCL (PRESSORS) 10 MG/ML IV SOLN
INTRAVENOUS | Status: DC | PRN
  Administered 2020-12-27: 80 ug via INTRAVENOUS
  Administered 2020-12-27 (×2): 40 ug via INTRAVENOUS
  Administered 2020-12-27 (×3): 80 ug via INTRAVENOUS

## 2020-12-27 MED ORDER — METFORMIN HCL 850 MG PO TABS
850.0000 mg | ORAL_TABLET | Freq: Two times a day (BID) | ORAL | Status: DC
  Administered 2020-12-27 – 2020-12-28 (×2): 850 mg via ORAL
  Filled 2020-12-27 (×4): qty 1

## 2020-12-27 MED ORDER — ACETAMINOPHEN 500 MG PO TABS
ORAL_TABLET | ORAL | Status: AC
  Administered 2020-12-27: 1000 mg via ORAL
  Filled 2020-12-27: qty 1

## 2020-12-27 MED ORDER — LACTATED RINGERS IV SOLN: INTRAVENOUS | Status: DC | PRN

## 2020-12-27 MED ORDER — PROPOFOL 1000 MG/100ML IV EMUL
INTRAVENOUS | Status: AC
  Filled 2020-12-27: qty 100

## 2020-12-27 MED ORDER — LIDOCAINE-EPINEPHRINE 1 %-1:100000 IJ SOLN
INTRAMUSCULAR | Status: DC | PRN
  Administered 2020-12-27: 6 mL

## 2020-12-27 MED ORDER — ONDANSETRON HCL 4 MG PO TABS: 4.0000 mg | ORAL_TABLET | Freq: Four times a day (QID) | ORAL | Status: DC | PRN

## 2020-12-27 MED ORDER — ONDANSETRON HCL 4 MG/2ML IJ SOLN
INTRAMUSCULAR | Status: DC | PRN
  Administered 2020-12-27: 4 mg via INTRAVENOUS

## 2020-12-27 MED ORDER — VITAMIN B-12 1000 MCG PO TABS
1000.0000 ug | ORAL_TABLET | Freq: Every day | ORAL | Status: DC
  Administered 2020-12-28: 1000 ug via ORAL
  Filled 2020-12-27: qty 1

## 2020-12-27 MED ORDER — PANTOPRAZOLE SODIUM 40 MG PO TBEC
40.0000 mg | DELAYED_RELEASE_TABLET | Freq: Every day | ORAL | Status: DC
  Administered 2020-12-27 – 2020-12-28 (×2): 40 mg via ORAL
  Filled 2020-12-27 (×2): qty 1

## 2020-12-27 MED ORDER — LEVETIRACETAM 500 MG PO TABS
1000.0000 mg | ORAL_TABLET | Freq: Two times a day (BID) | ORAL | Status: DC
  Administered 2020-12-27 – 2020-12-28 (×2): 1000 mg via ORAL
  Filled 2020-12-27 (×3): qty 2

## 2020-12-27 MED ORDER — OXYCODONE HCL 5 MG PO TABS
5.0000 mg | ORAL_TABLET | ORAL | Status: DC | PRN
  Administered 2020-12-27: 5 mg via ORAL

## 2020-12-27 MED ORDER — LATANOPROST 0.005 % OP SOLN
1.0000 [drp] | Freq: Every day | OPHTHALMIC | Status: DC
  Administered 2020-12-27: 1 [drp] via OPHTHALMIC
  Filled 2020-12-27: qty 2.5

## 2020-12-27 MED ORDER — ONDANSETRON HCL 4 MG/2ML IJ SOLN: 4.0000 mg | Freq: Once | INTRAMUSCULAR | Status: DC | PRN

## 2020-12-27 MED ORDER — SODIUM CHLORIDE 0.9 % IV SOLN: INTRAVENOUS | Status: DC | PRN

## 2020-12-27 MED ORDER — FINASTERIDE 5 MG PO TABS
5.0000 mg | ORAL_TABLET | Freq: Every day | ORAL | Status: DC
  Administered 2020-12-27: 5 mg via ORAL
  Filled 2020-12-27 (×2): qty 1

## 2020-12-27 MED ORDER — SUCCINYLCHOLINE CHLORIDE 200 MG/10ML IV SOSY
PREFILLED_SYRINGE | INTRAVENOUS | Status: AC
  Filled 2020-12-27: qty 10

## 2020-12-27 MED ORDER — METHOCARBAMOL 1000 MG/10ML IJ SOLN
500.0000 mg | Freq: Four times a day (QID) | INTRAVENOUS | Status: DC | PRN
  Administered 2020-12-27: 500 mg via INTRAVENOUS
  Filled 2020-12-27 (×2): qty 5

## 2020-12-27 MED ORDER — SODIUM CHLORIDE (PF) 0.9 % IJ SOLN
INTRAMUSCULAR | Status: AC
  Filled 2020-12-27: qty 20

## 2020-12-27 MED ORDER — CEFAZOLIN SODIUM-DEXTROSE 2-4 GM/100ML-% IV SOLN
2.0000 g | INTRAVENOUS | Status: AC
  Administered 2020-12-27: 2 g via INTRAVENOUS

## 2020-12-27 MED ORDER — FENTANYL CITRATE (PF) 100 MCG/2ML IJ SOLN
INTRAMUSCULAR | Status: AC
  Filled 2020-12-27: qty 2

## 2020-12-27 MED ORDER — OXYCODONE HCL 5 MG PO TABS: 10.0000 mg | ORAL_TABLET | ORAL | Status: DC | PRN

## 2020-12-27 MED ORDER — BISACODYL 10 MG RE SUPP
10.0000 mg | Freq: Every day | RECTAL | Status: DC | PRN
  Filled 2020-12-27: qty 1

## 2020-12-27 MED ORDER — SUCCINYLCHOLINE CHLORIDE 200 MG/10ML IV SOSY
PREFILLED_SYRINGE | INTRAVENOUS | Status: DC | PRN
  Administered 2020-12-27: 80 mg via INTRAVENOUS

## 2020-12-27 MED ORDER — ACETAMINOPHEN 10 MG/ML IV SOLN
INTRAVENOUS | Status: AC
  Filled 2020-12-27: qty 100

## 2020-12-27 MED ORDER — SUGAMMADEX SODIUM 200 MG/2ML IV SOLN: INTRAVENOUS | Status: DC | PRN

## 2020-12-27 MED ORDER — REMIFENTANIL HCL 1 MG IV SOLR
INTRAVENOUS | Status: AC
  Filled 2020-12-27: qty 1000

## 2020-12-27 MED ORDER — PHENYLEPHRINE HCL-NACL 20-0.9 MG/250ML-% IV SOLN
INTRAVENOUS | Status: DC | PRN
  Administered 2020-12-27: 20 ug/min via INTRAVENOUS

## 2020-12-27 MED ORDER — FENTANYL CITRATE (PF) 100 MCG/2ML IJ SOLN
25.0000 ug | INTRAMUSCULAR | Status: DC | PRN
  Administered 2020-12-27 (×4): 25 ug via INTRAVENOUS

## 2020-12-27 MED ORDER — ORAL CARE MOUTH RINSE: 15.0000 mL | Freq: Once | OROMUCOSAL | Status: AC

## 2020-12-27 MED ORDER — FENTANYL CITRATE (PF) 100 MCG/2ML IJ SOLN
INTRAMUSCULAR | Status: DC | PRN
  Administered 2020-12-27 (×2): 50 ug via INTRAVENOUS

## 2020-12-27 MED ORDER — LIDOCAINE HCL (CARDIAC) PF 100 MG/5ML IV SOSY
PREFILLED_SYRINGE | INTRAVENOUS | Status: DC | PRN
  Administered 2020-12-27: 80 mg via INTRAVENOUS

## 2020-12-27 MED ORDER — METHOCARBAMOL 500 MG PO TABS
ORAL_TABLET | ORAL | Status: AC
  Filled 2020-12-27: qty 1

## 2020-12-27 MED ORDER — SENNOSIDES-DOCUSATE SODIUM 8.6-50 MG PO TABS
1.0000 | ORAL_TABLET | Freq: Every evening | ORAL | Status: DC | PRN
  Filled 2020-12-27: qty 1

## 2020-12-27 MED ORDER — ONDANSETRON HCL 4 MG/2ML IJ SOLN: 4.0000 mg | Freq: Four times a day (QID) | INTRAMUSCULAR | Status: DC | PRN

## 2020-12-27 MED ORDER — OXYCODONE HCL 5 MG PO TABS
ORAL_TABLET | ORAL | Status: AC
  Filled 2020-12-27: qty 1

## 2020-12-27 MED ORDER — REMIFENTANIL HCL 1 MG IV SOLR
INTRAVENOUS | Status: DC | PRN
  Administered 2020-12-27: .1 ug/kg/min via INTRAVENOUS

## 2020-12-27 MED ORDER — PROPOFOL 10 MG/ML IV BOLUS
INTRAVENOUS | Status: DC | PRN
  Administered 2020-12-27: 30 mg via INTRAVENOUS
  Administered 2020-12-27: 130 mg via INTRAVENOUS
  Administered 2020-12-27: 80 mg via INTRAVENOUS

## 2020-12-27 MED ORDER — LOSARTAN POTASSIUM 25 MG PO TABS
25.0000 mg | ORAL_TABLET | Freq: Every day | ORAL | Status: DC
  Administered 2020-12-28: 25 mg via ORAL
  Filled 2020-12-27: qty 1

## 2020-12-27 MED ORDER — SODIUM CHLORIDE 0.9 % IV SOLN: INTRAVENOUS | Status: DC

## 2020-12-27 MED ORDER — SODIUM CHLORIDE 0.9% FLUSH: 3.0000 mL | INTRAVENOUS | Status: DC | PRN

## 2020-12-27 MED ORDER — METHOCARBAMOL 500 MG PO TABS
500.0000 mg | ORAL_TABLET | Freq: Four times a day (QID) | ORAL | Status: DC | PRN
  Administered 2020-12-27: 500 mg via ORAL

## 2020-12-27 MED ORDER — PROPOFOL 10 MG/ML IV BOLUS
INTRAVENOUS | Status: AC
  Filled 2020-12-27: qty 20

## 2020-12-27 MED ORDER — CHLORHEXIDINE GLUCONATE 0.12 % MT SOLN: 15.0000 mL | Freq: Once | OROMUCOSAL | Status: AC

## 2020-12-27 MED ORDER — OXYCODONE HCL 5 MG PO TABS
ORAL_TABLET | ORAL | Status: AC
  Filled 2020-12-27: qty 2

## 2020-12-27 MED ORDER — ACETAMINOPHEN 10 MG/ML IV SOLN
INTRAVENOUS | Status: DC | PRN
  Administered 2020-12-27: 1000 mg via INTRAVENOUS

## 2020-12-27 MED ORDER — DOCUSATE SODIUM 100 MG PO CAPS
100.0000 mg | ORAL_CAPSULE | Freq: Two times a day (BID) | ORAL | Status: DC
  Administered 2020-12-28: 100 mg via ORAL
  Filled 2020-12-27 (×3): qty 1

## 2020-12-27 MED ORDER — PROPOFOL 500 MG/50ML IV EMUL
INTRAVENOUS | Status: AC
  Filled 2020-12-27: qty 50

## 2020-12-27 MED ORDER — FLEET ENEMA 7-19 GM/118ML RE ENEM: 1.0000 | ENEMA | Freq: Once | RECTAL | Status: DC | PRN

## 2020-12-27 MED ORDER — ATORVASTATIN CALCIUM 10 MG PO TABS
10.0000 mg | ORAL_TABLET | Freq: Every day | ORAL | Status: DC
  Administered 2020-12-27: 10 mg via ORAL
  Filled 2020-12-27 (×2): qty 1

## 2020-12-27 MED ORDER — MORPHINE SULFATE (PF) 2 MG/ML IV SOLN: 2.0000 mg | INTRAVENOUS | Status: DC | PRN

## 2020-12-27 MED ORDER — CEFAZOLIN SODIUM-DEXTROSE 2-4 GM/100ML-% IV SOLN
INTRAVENOUS | Status: AC
  Filled 2020-12-27: qty 100

## 2020-12-27 MED ORDER — SODIUM CHLORIDE 0.9% FLUSH
3.0000 mL | Freq: Two times a day (BID) | INTRAVENOUS | Status: DC
  Administered 2020-12-27 – 2020-12-28 (×2): 3 mL via INTRAVENOUS

## 2020-12-27 MED ORDER — PROPYLENE GLYCOL 0.6 % OP SOLN: 1.0000 [drp] | Freq: Two times a day (BID) | OPHTHALMIC | Status: DC | PRN

## 2020-12-27 MED ORDER — ACETAMINOPHEN 500 MG PO TABS
ORAL_TABLET | ORAL | Status: AC
  Filled 2020-12-27: qty 2

## 2020-12-27 MED ORDER — PHENOL 1.4 % MT LIQD
1.0000 | OROMUCOSAL | Status: DC | PRN
  Filled 2020-12-27: qty 177

## 2020-12-27 MED ORDER — 0.9 % SODIUM CHLORIDE (POUR BTL) OPTIME
TOPICAL | Status: DC | PRN
  Administered 2020-12-27: 1000 mL

## 2020-12-27 MED ORDER — ALBUMIN HUMAN 5 % IV SOLN
INTRAVENOUS | Status: AC
  Filled 2020-12-27: qty 250

## 2020-12-27 MED ORDER — DEXAMETHASONE SODIUM PHOSPHATE 10 MG/ML IJ SOLN
INTRAMUSCULAR | Status: DC | PRN
  Administered 2020-12-27: 10 mg via INTRAVENOUS

## 2020-12-27 MED ORDER — GLIPIZIDE ER 5 MG PO TB24
5.0000 mg | ORAL_TABLET | Freq: Every day | ORAL | Status: DC
  Administered 2020-12-28: 5 mg via ORAL
  Filled 2020-12-27 (×2): qty 1

## 2020-12-27 MED ORDER — ACETAMINOPHEN 500 MG PO TABS
1000.0000 mg | ORAL_TABLET | Freq: Four times a day (QID) | ORAL | Status: AC
  Administered 2020-12-27: 1000 mg via ORAL

## 2020-12-27 MED ORDER — PROPOFOL 500 MG/50ML IV EMUL
INTRAVENOUS | Status: DC | PRN
  Administered 2020-12-27: 150 ug/kg/min via INTRAVENOUS

## 2020-12-27 MED ORDER — HEMOSTATIC AGENTS (NO CHARGE) OPTIME
TOPICAL | Status: DC | PRN
  Administered 2020-12-27: 1 via TOPICAL

## 2020-12-27 SURGICAL SUPPLY — 76 items
ADH SKN CLS APL DERMABOND .7 (GAUZE/BANDAGES/DRESSINGS) ×1
AGENT HMST KT MTR STRL THRMB (HEMOSTASIS) ×1
APL PRP STRL LF DISP 70% ISPRP (MISCELLANEOUS) ×2
BIT DRILL 13 (BIT) ×1 IMPLANT
BLADE BOVIE TIP EXT 4 (BLADE) ×2 IMPLANT
BUR NEURO DRILL SOFT 3.0X3.8M (BURR) ×2 IMPLANT
BUR SABER DIAMOND 3.0 (BURR) IMPLANT
CATH COUDE FOLEY 2W 5CC 16FR (CATHETERS) ×1 IMPLANT
CHLORAPREP W/TINT 26 (MISCELLANEOUS) ×4 IMPLANT
COUNTER NEEDLE 20/40 LG (NEEDLE) ×2 IMPLANT
COVER LIGHT HANDLE STERIS (MISCELLANEOUS) ×4 IMPLANT
CUP MEDICINE 2OZ PLAST GRAD ST (MISCELLANEOUS) ×4 IMPLANT
DERMABOND ADVANCED (GAUZE/BANDAGES/DRESSINGS) ×1
DERMABOND ADVANCED .7 DNX12 (GAUZE/BANDAGES/DRESSINGS) ×1 IMPLANT
DRAPE C-ARM 42X72 X-RAY (DRAPES) ×4 IMPLANT
DRAPE INCISE IOBAN 66X45 STRL (DRAPES) ×2 IMPLANT
DRAPE MICROSCOPE SPINE 48X150 (DRAPES) ×2 IMPLANT
DRAPE THYROID T SHEET (DRAPES) ×2 IMPLANT
DRSG TEGADERM 4X4.75 (GAUZE/BANDAGES/DRESSINGS) ×1 IMPLANT
ELECT CAUTERY BLADE TIP 2.5 (TIP) ×2
ELECT EZSTD 165MM 6.5IN (MISCELLANEOUS) ×2
ELECT REM PT RETURN 9FT ADLT (ELECTROSURGICAL) ×2
ELECTRODE CAUTERY BLDE TIP 2.5 (TIP) ×1 IMPLANT
ELECTRODE EZSTD 165MM 6.5IN (MISCELLANEOUS) ×1 IMPLANT
ELECTRODE REM PT RTRN 9FT ADLT (ELECTROSURGICAL) ×1 IMPLANT
FEE INTRAOP CADWELL SUPPLY NCS (MISCELLANEOUS) ×1 IMPLANT
FEE INTRAOP MONITOR IMPULS NCS (MISCELLANEOUS) IMPLANT
GAUZE 4X4 16PLY ~~LOC~~+RFID DBL (SPONGE) ×2 IMPLANT
GAUZE SPONGE 4X4 12PLY STRL (GAUZE/BANDAGES/DRESSINGS) ×2 IMPLANT
GLOVE SRG 8 PF TXTR STRL LF DI (GLOVE) ×2 IMPLANT
GLOVE SURG SYN 6.5 ES PF (GLOVE) ×4 IMPLANT
GLOVE SURG SYN 6.5 PF PI (GLOVE) ×2 IMPLANT
GLOVE SURG SYN 8.0 (GLOVE) ×4 IMPLANT
GLOVE SURG SYN 8.0 PF PI (GLOVE) ×2 IMPLANT
GLOVE SURG UNDER POLY LF SZ6.5 (GLOVE) ×4 IMPLANT
GLOVE SURG UNDER POLY LF SZ8 (GLOVE) ×4
GOWN SRG LRG LVL 4 IMPRV REINF (GOWNS) ×2 IMPLANT
GOWN STRL REIN LRG LVL4 (GOWNS) ×4
GOWN STRL REUS W/ TWL XL LVL3 (GOWN DISPOSABLE) ×2 IMPLANT
GOWN STRL REUS W/TWL XL LVL3 (GOWN DISPOSABLE) ×4
GRADUATE 1200CC STRL 31836 (MISCELLANEOUS) ×2 IMPLANT
HOLDER FOLEY CATH W/STRAP (MISCELLANEOUS) ×1 IMPLANT
INTRAOP CADWELL SUPPLY FEE NCS (MISCELLANEOUS) ×1
INTRAOP DISP SUPPLY FEE NCS (MISCELLANEOUS) ×2
INTRAOP MONITOR FEE IMPULS NCS (MISCELLANEOUS) ×1
INTRAOP MONITOR FEE IMPULSE (MISCELLANEOUS) ×2
KIT TURNOVER KIT A (KITS) ×2 IMPLANT
MANIFOLD NEPTUNE II (INSTRUMENTS) ×2 IMPLANT
MARKER SKIN DUAL TIP RULER LAB (MISCELLANEOUS) ×4 IMPLANT
NDL SPNL 22GX3.5 QUINCKE BK (NEEDLE) ×1 IMPLANT
NEEDLE HYPO 22GX1.5 SAFETY (NEEDLE) ×2 IMPLANT
NEEDLE SPNL 22GX3.5 QUINCKE BK (NEEDLE) ×2 IMPLANT
NS IRRIG 1000ML POUR BTL (IV SOLUTION) ×2 IMPLANT
PACK LAMINECTOMY NEURO (CUSTOM PROCEDURE TRAY) ×2 IMPLANT
PAD ARMBOARD 7.5X6 YLW CONV (MISCELLANEOUS) ×4 IMPLANT
PIN CASPAR 14 (PIN) ×1 IMPLANT
PIN CASPAR 14MM (PIN) ×2
PLATE ZEVO 3LVL 51MM (Plate) ×1 IMPLANT
SCREW 3.5 SELFDRILL 15MM VARI (Screw) ×8 IMPLANT
SPACER BONE CORNERSTONE 5X14 (Orthopedic Implant) ×2 IMPLANT
SPACER BONE CORNERSTONE 6X14 (Orthopedic Implant) ×1 IMPLANT
SPONGE KITTNER 5P (MISCELLANEOUS) ×1 IMPLANT
SURGIFLO W/THROMBIN 8M KIT (HEMOSTASIS) ×2 IMPLANT
SUT MNCRL 4-0 (SUTURE) ×2
SUT MNCRL 4-0 27XMFL (SUTURE) ×1
SUT SILK 2 0 (SUTURE)
SUT SILK 2-0 18XBRD TIE 12 (SUTURE) IMPLANT
SUT VICRYL 2-0 SH 8X27 (SUTURE) ×2 IMPLANT
SUT VICRYL 3-0 CR8 SH (SUTURE) ×3 IMPLANT
SUTURE MNCRL 4-0 27XMF (SUTURE) ×1 IMPLANT
SYR 30ML LL (SYRINGE) ×2 IMPLANT
TAPE CLOTH 3X10 WHT NS LF (GAUZE/BANDAGES/DRESSINGS) ×2 IMPLANT
TOWEL OR 17X26 4PK STRL BLUE (TOWEL DISPOSABLE) ×4 IMPLANT
TRAY FOLEY MTR SLVR 16FR STAT (SET/KITS/TRAYS/PACK) ×1 IMPLANT
TUBING CONNECTING 10 (TUBING) ×2 IMPLANT
WATER STERILE IRR 500ML POUR (IV SOLUTION) ×1 IMPLANT

## 2020-12-27 NOTE — Anesthesia Procedure Notes (Signed)
Procedure Name: Intubation Date/Time: 12/27/2020 9:00 AM Performed by: Chanetta Marshall, CRNA Pre-anesthesia Checklist: Patient identified, Emergency Drugs available, Suction available and Patient being monitored Patient Re-evaluated:Patient Re-evaluated prior to induction Oxygen Delivery Method: Circle system utilized Preoxygenation: Pre-oxygenation with 100% oxygen Induction Type: IV induction Ventilation: Mask ventilation without difficulty Laryngoscope Size: McGraph and 4 Grade View: Grade I Tube type: Oral Tube size: 7.5 mm Number of attempts: 1 Airway Equipment and Method: Stylet and Video-laryngoscopy Placement Confirmation: ETT inserted through vocal cords under direct vision, positive ETCO2, breath sounds checked- equal and bilateral and CO2 detector Secured at: 23 cm Tube secured with: Tape Dental Injury: Teeth and Oropharynx as per pre-operative assessment

## 2020-12-27 NOTE — Op Note (Signed)
Operative Note   SURGERY DATE:  12/27/2020   PRE-OP DIAGNOSIS: Cervical spinal stenosis with myelopathy   POST-OP DIAGNOSIS: Post-Op Diagnosis Codes: Cervical spinal stenosis with myelopathy   Procedure(s) with comments: C3-4, C4-5, C5-6 anterior cervical discectomy and fusion   SURGEON:     * Malen Gauze, MD       Cooper Render, PA Assistant   ANESTHESIA: General    OPERATIVE FINDINGS: Stenosis at C3-4, C4-5, C5-6   Procedure Indications Douglas Mercer presented to our clinic on 10/18 with weakness in the left arm. He had a MRI that showed disc herniations and osteophytes from C3-C6 causing stenosis. He also had an EMG that showed radiculopathy. Given this, we recommended an anterior cervical decompression and fusion to relieve the pressure on the spinal cord and nerves. The risks of hematoma, infection, poor bone healing and failure of fusion, cord injury, weakness, numbness, neck pain, stroke, and death were discussed in detail. All questions were answered and the patient elected to proceed with the surgery.     Procedure After obtaining informed consent, the patient was taken to the Operating Room where general anesthesia was induced and the patient intubated. Vascular access was obtained. Decadron was administered. The head was slightly extended and imaging used to identify a skin crease overlying the C5 vertebral body.  Neuro monitoring electrodes were placed for baseline MEP's and SSEPs were obtained.  A Foley catheter was placed.    The patient was prepped and draped in the usual sterile fashion and a timeout was performed per protocol. Local anesthesia was instilled with epinephrine along the planned incision site. A transverse cervical incision was performed on the left in a skin crease. The incision was carried to the level of the platysma and then cautery was used to incise the muscle. Blunt dissection was used to expand the plane and the dissection was carried deep medial  to the SCM and carotid sheath being careful to identify the trachea and esophagus medially. The prevertebral fascia was identified and this was bluntly dissected to expose the disc spaces. A needle was placed in the disc space and x-ray confirmed the C3/4 disc level.     Next, cautery was used to undermine the longus colli muscles bilaterally and identify the C4/5 disc space. Caspar pins were placed at C5 and C4 and a retractor system placed under the muscles to complete the exposure. Next, a combination of curettes and Kerrison rongeurs were used to remove the anterior osteophyte at C4/5 and then the disc material. A 64mm matchstick was used to shave the endplates of the adjacent bodies. The microscope was brought into the field for the remainder of the surgery. The drill was used to remove the osteophyte/disc complex deep to the level of the PLL at C4/5 where there was significant osteophyte to the right. The PLL was entered with a hook and then the PLL was removed along with remaining disc material to decompress centrally and then out into bilateral neuro foramen. A blunt probe was used to confirm no residual stenosis laterally and a curette used to ensure no compressive posterior osteophyte remained. Floseal was used for hemostasis. Allograft was placed, 69mm in height and placed slightly recessed to the anterior edge of vertebral body   The caspar pin was moved from C5 to C3. The disc space was entered there sharply and the disc material removed in a similar fashion at the C3/4 level. The endplates were prepared using the hgh speed drill and then  the posterior disc/osteophyte complex also removed in similar fashion.  A trial spacer was used to size the graft. The PLL was entered with a hook and then the thickened PLL was removed along with remaining disc material to decompress centrally and then out into bilateral neuro foramen. A blunt probe was used to confirm no residual stenosis laterally and a curette  used to ensure no posterior osteophyte remained. Floseal was used for hemostasis.  Allograft was placed, 39mm in height and placed slightly recessed to the anterior edge of vertebral body   The caspar pin was moved to C5 and C6 and the disc space was entered there sharply and the disc material removed in a similar fashion at the C5/6 level. The endplates were prepared using the hgh speed drill and then the posterior disc/osteophyte complex also removed in similar fashion.  At this level, there was collapsed disc space and this was widened with the drill. The PLL was entered with a hook and then the thickened PLL was removed along with remaining disc material to decompress centrally and then out into bilateral neuro foramen. A blunt probe was used to confirm no residual stenosis laterally and a curette used to ensure no posterior osteophyte remained. Floseal was used for hemostasis.  Allograft was placed, 1mm in height and placed slightly recessed to the anterior edge of vertebral body.   X-ray confirmed good placement of grafts.  Monitoring remained stable throughout the case.  The caspar pins were removed and bone wax placed. The remainder of the osteophytes were drilled to allow for plating. Next, a 36mm plate was found to be the adequate size and was placed in the midline and secured with two 63mm screws at each bone level. Xray was obtained confirming good graft placement and adequate depth of screws. The retractors were removed. The wound was irrigated copiously and hemostasis obtained. The platysma was closed with 2-0 vicryl suture. The dermis was closed with 3-0 Vicryl and Dermabond was placed on the skin.    The patient had general anesthesia reversed and was extubated following the procedure. He awoke following commands with symmetric movement. He was taken to the PACU where he continued his recovery and then the ward.        ESTIMATED BLOOD LOSS:   40 cc   SPECIMENS None    IMPLANT CORNERSTONE [id number] LORDOTI - [id number]  Inventory Item: CORNERSTONE [id number] LORDOTI Serial no.: [id number] Model/Cat no.: [id number]  Implant name: CORNERSTONE [id number] LORDOTI - [id number] Laterality: N/A Area: Spine Cervical  Manufacturer: [employer] Date of Manufacture:    Action: Implanted Number Used: 1   Device Identifier:  Device Identifier Type:     CORNERSTONE TISSUE 5X14X11MM - [id number]  Inventory Item: CORNERSTONE TISSUE 5X14X11MM Serial no.: [id number] Model/Cat no.: [id number]  Implant name: CORNERSTONE TISSUE 5X14X11MM - [id number] Laterality: N/A Area: Spine Cervical  Manufacturer: [employer] Date of Manufacture:    Action: Implanted Number Used: 1   Device Identifier:  Device Identifier Type:     CORNERSTONE TISSUE 5X14X11MM - [id number]  Inventory Item: CORNERSTONE TISSUE 5X14X11MM Serial no.: [id number] Model/Cat no.: [id number]  Implant name: CORNERSTONE TISSUE 5X14X11MM - [id number] Laterality: N/A Area: Spine Cervical  Manufacturer: [employer] Date of Manufacture:    Action: Implanted Number Used: 1   Device Identifier:  Device Identifier Type:     PLATE ZEVO 3LVL 63SL - [id number]  Inventory Item: PLATE ZEVO 3LVL 11XB Serial no.:  Model/Cat no.: [id number]  Implant name: PLATE ZEVO  3LVL 51MM - [id number] Laterality: N/A Area: Spine Cervical  Manufacturer: [employer] Date of Manufacture:    Action: Implanted Number Used: 1   Device Identifier:  Device Identifier Type:     SCREW 3.5 SELFDRILL 15MM VARI - [id number]  Inventory Item: SCREW 3.5 SELFDRILL 15MM VARI Serial no.:  Model/Cat no.: [id number]  Implant name: SCREW 3.5 SELFDRILL 15MM VARI - [id number] Laterality: N/A Area: Spine Cervical  Manufacturer: [employer] Date of Manufacture:    Action: Implanted Number Used: 8   Device Identifier:  Device Identifier Type:         I performed the case in its entirety with assistance of PA, Ernestene Kiel, [place]

## 2020-12-27 NOTE — Transfer of Care (Signed)
Immediate Anesthesia Transfer of Care Note  Patient: Douglas Mercer  Procedure(s) Performed: C3-6 ANTERIOR CERVICAL DECOMPRESSION/DISCECTOMY FUSION 3 LEVELS  Patient Location: PACU  Anesthesia Type:General  Level of Consciousness: awake  Airway & Oxygen Therapy: Patient Spontanous Breathing and Patient connected to face mask oxygen  Post-op Assessment: Report given to RN and Post -op Vital signs reviewed and stable  Post vital signs: Reviewed and stable  Last Vitals:  Vitals Value Taken Time  BP    Temp    Pulse    Resp    SpO2      Last Pain:  Vitals:   12/27/20 0818  TempSrc: Temporal  PainSc: 0-No pain         Complications: No notable events documented.

## 2020-12-27 NOTE — Interval H&P Note (Signed)
History and Physical Interval Note:  12/27/2020 8:03 AM  Douglas Mercer  has presented today for surgery, with the diagnosis of Myelopathy G95.9 Cervical radiculopathy M54.12.  The various methods of treatment have been discussed with the patient and family. After consideration of risks, benefits and other options for treatment, the patient has consented to  Procedure(s): C3-6 ANTERIOR CERVICAL DECOMPRESSION/DISCECTOMY FUSION 3 LEVELS (N/A) as a surgical intervention.  The patient's history has been reviewed, patient examined, no change in status, stable for surgery.  I have reviewed the patient's chart and labs.  Questions were answered to the patient's satisfaction.     Deetta Perla

## 2020-12-27 NOTE — Anesthesia Preprocedure Evaluation (Signed)
Anesthesia Evaluation  Patient identified by MRN, date of birth, ID band Patient awake    Reviewed: Allergy & Precautions, H&P , NPO status , Patient's Chart, lab work & pertinent test results, reviewed documented beta blocker date and time   History of Anesthesia Complications Negative for: history of anesthetic complications  Airway Mallampati: III  TM Distance: >3 FB Neck ROM: full    Dental  (+) Dental Advidsory Given, Caps, Missing, Chipped, Poor Dentition   Pulmonary neg pulmonary ROS,    Pulmonary exam normal breath sounds clear to auscultation       Cardiovascular Exercise Tolerance: Good hypertension, (-) angina(-) Past MI and (-) Cardiac Stents Normal cardiovascular exam(-) dysrhythmias (-) Valvular Problems/Murmurs Rhythm:regular Rate:Normal     Neuro/Psych Seizures -, Well Controlled,  negative psych ROS   GI/Hepatic Neg liver ROS, GERD  ,  Endo/Other  diabetes, Type 2, Oral Hypoglycemic Agents  Renal/GU negative Renal ROS  negative genitourinary   Musculoskeletal   Abdominal   Peds  Hematology negative hematology ROS (+)   Anesthesia Other Findings Past Medical History: No date: Allergic rhinitis No date: Anti-phospholipid antibody syndrome (HCC) No date: BPH (benign prostatic hyperplasia) No date: BPH (benign prostatic hypertrophy) No date: Deficiency of clotting factor (HCC) No date: Diabetes mellitus without complication ([place]) 84/6962: DVT (deep venous thrombosis) (HCC)     Comment:  large left lower No date: ED (erectile dysfunction) No date: Epilepsy ([place])     Comment:  last seizure 09/27/2017 No date: GERD (gastroesophageal reflux disease) No date: Glaucoma No date: Hyperlipidemia No date: Hypertension No date: Internal hemorrhoids No date: Low HDL (under 40) No date: Pneumonia 1992: Pulmonary embolism (HCC)     Comment:  bilateral w/ PE reportedly (+) lupus anticoagulant No date:  Rosacea No date: Tubular adenoma of colon   Reproductive/Obstetrics negative OB ROS                             Anesthesia Physical Anesthesia Plan  ASA: 2  Anesthesia Plan: General   Post-op Pain Management:    Induction: Intravenous  PONV Risk Score and Plan: 2 and Ondansetron, Dexamethasone and Treatment may vary due to age or medical condition  Airway Management Planned: Oral ETT  Additional Equipment:   Intra-op Plan:   Post-operative Plan: Extubation in OR  Informed Consent: I have reviewed the patients History and Physical, chart, labs and discussed the procedure including the risks, benefits and alternatives for the proposed anesthesia with the patient or authorized representative who has indicated his/her understanding and acceptance.     Dental Advisory Given  Plan Discussed with: Anesthesiologist, CRNA and Surgeon  Anesthesia Plan Comments:         Anesthesia Quick Evaluation

## 2020-12-27 NOTE — Progress Notes (Signed)
   12/27/20 0745  Clinical Encounter Type  Visited With Patient  Visit Type Initial;Spiritual support;Social support  Referral From Chaplain  Consult/Referral To [employer] visited PT and offered emotional and spiritual support. Space was provided for expression emotions and concerns. PT stated he was ready to get the procedure over with. Chaplain ministered with peaceful presence, well wishes, and reflective listening.  Andee Poles, M. Div.

## 2020-12-27 NOTE — H&P (Signed)
Douglas Mercer is an 75 y.o. male.   Chief Complaint: Weakness HPI: Douglas Mercer is here for evaluation of weakness ongoing in the left arm. He states he does not remember any prior symptoms but after a move recently he started noticing some weakness in the left bicep. He denies any pain, numbness, or weakness in other areas. He is right-handed but he is having difficulty given the left arm weakness. He is diabetic but his last A1c was in the 6 range. He does not endorse any difficulty with balance or numbness in his lower extremities. He does comment on some prior fine motor weakness in his hands. Given the weakness in the arm he has been evaluated with an MRI of the cervical spine. He does report a seizure disorder and had MRI of the brain previously but nothing in the past few years. MRI of the spine showed stenosis from C3-6 and we discusses decompression and fusion to relieve pressure from spinal cord and nerves  Past Medical History:  Diagnosis Date   Allergic rhinitis    Anti-phospholipid antibody syndrome (HCC)    BPH (benign prostatic hyperplasia)    BPH (benign prostatic hypertrophy)    Deficiency of clotting factor (HCC)    Diabetes mellitus    type 2   DVT (deep venous thrombosis) ([place]) 02/2009   large left lower   ED (erectile dysfunction)    Epilepsy ([place])    last seizure 09/27/2017   GERD (gastroesophageal reflux disease)    Glaucoma    Hyperlipidemia    Hypertension    Internal hemorrhoids    Low HDL (under 40)    Pneumonia    Pulmonary embolism ([place]) 1992   bilateral w/ PE reportedly (+) lupus anticoagulant   Rosacea    Tubular adenoma of colon    Type 2 diabetes mellitus without complication, without long-term current use of insulin ([place]) 07/14/2015    Past Surgical History:  Procedure Laterality Date   ADENOIDECTOMY  1959   CATARACT EXTRACTION, BILATERAL     L eye on 04/24/17 and R eye on 05/08/17.   COLONOSCOPY  05/07/2006   colonoscopy with polypectomy   07/07/2002   2 small descending colon polyps   colonoscopy with polypectomy  04/19/2001   72mm sessile mucosal polyp in distal sigmoid colon   diverticulosis  07/07/2002,04/19/2001   mild sigmoid   ESOPHAGOGASTRODUODENOSCOPY     HERNIA REPAIR     1995 and 08/13/2009   [place] /2011   right-sided, mesh placed   PROSTATE SURGERY     18 biopsies.  Benign.   TONSILLECTOMY  1959   TRANSURETHRAL RESECTION OF PROSTATE  08/13/09   TRANSURETHRAL RESECTION OF PROSTATE  08/13/2009    Family History  Problem Relation Age of Onset   Diabetes Mother    Melanoma Mother    COPD Mother    COPD Father    Heart attack Maternal Uncle    Stroke Maternal Aunt        grandparents   Colon cancer Paternal Uncle        x2   Prostate cancer Neg Hx    Seizures Neg Hx    Esophageal cancer Neg Hx    Social History:  reports that he has never smoked. He has never used smokeless tobacco. He reports current alcohol use. He reports that he does not use drugs.  Allergies: No Known Allergies  Medications Prior to Admission  Medication Sig Dispense Refill   aspirin 81  MG tablet Take 1 tablet (81 mg total) by mouth daily. 90 tablet 3   atorvastatin (LIPITOR) 10 MG tablet Take 1 tablet (10 mg total) by mouth daily at 6 PM. 90 tablet 0   Co-Enzyme Q-10 100 MG CAPS Take 100 mg by mouth daily.     finasteride (PROSCAR) 5 MG tablet Take 1 tablet (5 mg total) by mouth daily. (Patient taking differently: Take 5 mg by mouth at bedtime.) 90 tablet 3   fish oil-omega-3 fatty acids 1000 MG capsule Take 2 g by mouth daily.       fluticasone (FLONASE) 50 MCG/ACT nasal spray Place 2 sprays into the nose daily. (Patient taking differently: Place 2 sprays into the nose daily as needed for allergies.) 48 g 0   glipiZIDE (GLUCOTROL XL) 5 MG 24 hr tablet TAKE 1 TABLET(5 MG) BY MOUTH DAILY WITH BREAKFAST 90 tablet 3   latanoprost (XALATAN) 0.005 % ophthalmic solution Place 1 drop into both eyes at bedtime.  2    levETIRAcetam (KEPPRA) 1000 MG tablet Take 1 tablet (1,000 mg total) by mouth 2 (two) times daily. 28 tablet 0   losartan (COZAAR) 25 MG tablet Take 25 mg by mouth daily.     metFORMIN (GLUCOPHAGE) 850 MG tablet TAKE 1 TABLET TWICE A DAY  WITH A MEAL 180 tablet 1   pantoprazole (PROTONIX) 40 MG tablet Take 1 tablet (40 mg total) by mouth daily. 90 tablet 3   Propylene Glycol 0.6 % SOLN Place 1 drop into both eyes 2 (two) times daily as needed (dry eyes).     vitamin B-12 (CYANOCOBALAMIN) 1000 MCG tablet Take 1,000 mcg by mouth daily.     XARELTO 20 MG TABS tablet TAKE 1 TABLET DAILY AT     12:00 NOON 90 tablet 0    Results for orders placed or performed during the hospital encounter of 12/27/20 (from the past 48 hour(s))  Glucose, capillary     Status: Abnormal   Collection Time: 12/27/20  7:28 AM  Result Value Ref Range   Glucose-Capillary 119 (H) 70 - 99 mg/dL    Comment: Glucose reference range applies only to samples taken after fasting for at least 8 hours.   No results found.  Review of Systems General ROS: Negative Psychological ROS: Negative Ophthalmic ROS: Negative ENT ROS: Negative Hematological and Lymphatic ROS: Negative  Endocrine ROS: Negative Respiratory ROS: Negative Cardiovascular ROS: Negative Gastrointestinal ROS: Negative Genito-Urinary ROS: Negative Musculoskeletal ROS: Negative Neurological ROS: Positive for weakness, negative for numbness Dermatological ROS: Negative There were no vitals taken for this visit. Physical Exam  General appearance: Alert, cooperative, in no acute distress Head: Normocephalic, atraumatic Eyes: Normal, EOM intact Oropharynx: Wearing facemask Neck: Supple, range of motion appears full Ext: No edema in upper extremities, no obvious focal atrophy  Neurologic exam:  Mental status: alertness: alert, affect: normal Speech: fluent and clear Motor:strength symmetric 5/5 in bilateral tricep, interossei, grip. He is 5-5 strength in  bilateral hip flexion, knee flexion, knee extension, dorsiflexion, plantarflexion. He is 5-5 in right deltoid and bicep but on the left he is 4-5 in deltoid and 2 out of 5 in bicep Sensory: intact to light touch in all extremities Reflexes: 2+ at left bicep, slightly hyperreflexic at the right bicep, positive Hoffmann's on the left No ankle clonus Gait: normal   Imaging: MRI cervical spine: There is reversal of the data curvature around the C4 level. There is severe degenerative disease noted from C3-C6. There is collapse of the  disc spaces at these levels. There is a broad disc protrusion more eccentric to the right at C4-5 causing severe canal stenosis. There is a disc osteophyte complex at C5-6 there is also causes moderate to severe canal stenosis. There is a left eccentric disc radiation at C3-4 which causes more mild to moderate stenosis  Assessment/Plan Proceed with C3-6 ACDF  Deetta Perla, MD 12/27/2020, 8:01 AM

## 2020-12-27 NOTE — Plan of Care (Signed)
Continue to provide education as needed with current plan of care

## 2020-12-27 NOTE — Consult Note (Signed)
Pharmacy Antibiotic Note  Douglas Mercer is a 75 y.o. male admitted on 12/27/2020 for C3-6 cervical decompression/discectomy.  Pharmacy has been consulted for Ancef dosing for surgical prophylaxis. Patient has no known drug allergies.    Recent Labs  Lab 12/20/20 1540  WBC 7.3  CREATININE 1.01    Estimated Creatinine Clearance: 58.1 mL/min (by C-G formula based on SCr of 1.01 mg/dL).    Weight: 64 kg   No Known Allergies   Plan: Ancef 2 g IV once in pre-op   Thank you for allowing pharmacy to be a part of this patient's care.  Darnelle Bos, PharmD 12/27/2020 7:58 AM

## 2020-12-28 ENCOUNTER — Encounter: Payer: Self-pay | Admitting: Neurosurgery

## 2020-12-28 DIAGNOSIS — M4802 Spinal stenosis, cervical region: Secondary | ICD-10-CM | POA: Diagnosis not present

## 2020-12-28 LAB — GLUCOSE, CAPILLARY
Glucose-Capillary: 114 mg/dL — ABNORMAL HIGH (ref 70–99)
Glucose-Capillary: 164 mg/dL — ABNORMAL HIGH (ref 70–99)

## 2020-12-28 MED ORDER — METHOCARBAMOL 500 MG PO TABS
500.0000 mg | ORAL_TABLET | Freq: Four times a day (QID) | ORAL | 0 refills | Status: DC | PRN
Start: 2020-12-28 — End: 2021-04-01

## 2020-12-28 MED ORDER — SENNOSIDES-DOCUSATE SODIUM 8.6-50 MG PO TABS: 1.0000 | ORAL_TABLET | Freq: Every evening | ORAL | 0 refills | Status: DC | PRN

## 2020-12-28 MED ORDER — ACETAMINOPHEN 500 MG PO TABS
ORAL_TABLET | ORAL | Status: AC
  Filled 2020-12-28: qty 2

## 2020-12-28 MED ORDER — ACETAMINOPHEN 500 MG PO TABS
ORAL_TABLET | ORAL | Status: AC
  Administered 2020-12-28: 1000 mg via ORAL
  Filled 2020-12-28: qty 2

## 2020-12-28 MED ORDER — OXYCODONE HCL 5 MG PO TABS
ORAL_TABLET | ORAL | Status: AC
  Administered 2020-12-28: 5 mg via ORAL
  Filled 2020-12-28: qty 1

## 2020-12-28 MED ORDER — METHOCARBAMOL 500 MG PO TABS
ORAL_TABLET | ORAL | Status: AC
  Administered 2020-12-28: 500 mg via ORAL
  Filled 2020-12-28: qty 1

## 2020-12-28 MED ORDER — OXYCODONE HCL 5 MG PO TABS: 5.0000 mg | ORAL_TABLET | ORAL | 0 refills | Status: AC | PRN

## 2020-12-28 NOTE — TOC Progression Note (Signed)
Transition of Care [facility]) - Progression Note    Patient Details  Name: Douglas Mercer MRN: [id number] Date of Birth: 23-Apr-1945  [facility]) CM/SW [place], RN Phone Number: 12/28/2020, 12:26 PM  Clinical Narrative:    Called [employer] to notify that the patient needs a rolling walker to go home with, Spoke with Freda Munro, it will be delivered to the room today prior to DC        Expected Discharge Plan and Services           Expected Discharge Date: 12/28/20                                     Social Determinants of Health (SDOH) Interventions    Readmission Risk Interventions No flowsheet data found.

## 2020-12-28 NOTE — Progress Notes (Signed)
    Attending Progress Note  History: Zahid Carneiro is s/p C3-6 ACDF for stenosis and myelopathy   POD#1: pt complained of left deltoid pain yesterday.   Physical Exam: Vitals:   12/28/20 0400 12/28/20 0741  BP: 121/65 127/64  Pulse: 75 81  Resp: 16 17  Temp: (!) 97.5 F (36.4 C) 98.5 F (36.9 C)  SpO2: 100% 100%    AA Ox3 CNI Strength:5/5 throughout except 4-/5 left deltoid and 2/5 left bicep Incision is clean dry intact with Dermabond in place  Data:  No results for input(s): NA, K, CL, CO2, BUN, CREATININE, LABGLOM, GLUCOSE, CALCIUM in the last 168 hours. No results for input(s): AST, ALT, ALKPHOS in the last 168 hours.  Invalid input(s): TBILI   No results for input(s): WBC, HGB, HCT, PLT in the last 168 hours. No results for input(s): APTT, INR in the last 168 hours.       Other tests/results: none  Assessment/Plan:  Zayn Selley is a 75 year old presenting with cervical stenosis and left proximal arm weakness status post C3-6 ACDF.  - mobilize - pain control - PTOT -Patient is ambulate, urinate, and tolerate p.o. intake.   -Plan for discharge this afternoon should he be able to meet these goals.  Cooper Render PA-C Department of Neurosurgery

## 2020-12-28 NOTE — Progress Notes (Signed)
Occupational Therapy Evaluation Patient Details Name: Douglas Mercer MRN: [id number] DOB: [birth year] Today's Date: 12/28/2020   History of Present Illness Pt is a 75 y.o. male with the diagnosis of myelopathy and cervical radiculopathy s/p C3-6 anterior cervical decompression/fusion. PMH includes: DM, DVT, seizures/epilepsy, glaucoma, HTN, and PE.   Clinical Impression   Douglas Mercer was seen for OT evaluation this date. Prior to hospital admission, pt was independent. Pt lives w/ spouse. Currently pt demonstrates impairments as described below (See OT problem list) which functionally limit his ability to perform ADL/self-care tasks. Pt currently requires MOD I for don/doff socks, sitting EOB. Pt MAX A for don/doff cervical collar, sitting EOB. Spouse completed don/doff surgical collar x2, 1st trial w/ MIN A, 2nd trial w/ MIN Vcs. Pt SUP + VCs for simulated household task, lifting lightweight object w/o breaking cervical precautions. Pt noted to demonstrate 1/5 mmt for bicep, baseline per pt report. Pt would benefit from skilled OT services to address noted impairments and functional limitations (see below for any additional details) in order to maximize safety and independence while minimizing falls risk and caregiver burden. Upon hospital discharge, recommend following physician's orders for needed follow up OT.        Recommendations for follow up therapy are one component of a multi-disciplinary discharge planning process, led by the attending physician.  Recommendations may be updated based on patient status, additional functional criteria and insurance authorization.   Follow Up Recommendations  Follow physician's recommendations for discharge plan and follow up therapies    Assistance Recommended at Discharge Intermittent Supervision/Assistance  Functional Status Assessment  Patient has had a recent decline in their functional status and demonstrates the ability to make significant  improvements in function in a reasonable and predictable amount of time.  Equipment Recommendations  BSC/3in1    Recommendations for Other Services       Precautions / Restrictions Precautions Precautions: Cervical Precaution Booklet Issued: No Precaution Comments: Apply/remove brace in sitting, can ambulate to bathroom without brace, can remove brace for shower, can remove brace in bed Required Braces or Orthoses: Cervical Brace Cervical Brace: Hard collar Restrictions Weight Bearing Restrictions: No      Mobility Bed Mobility Overal bed mobility: Modified Independent             General bed mobility comments: Used bedrails, increased time and effort, HOB elevated    Transfers Overall transfer level: Needs assistance Equipment used: Rolling walker (2 wheels) Transfers: Sit to/from Stand;Bed to chair/wheelchair/BSC Sit to Stand: Supervision           General transfer comment:       Balance Overall balance assessment: Needs assistance Sitting-balance support: No upper extremity supported;Feet supported Sitting balance-Leahy Scale: Good     Standing balance support: No upper extremity supported;During functional activity Standing balance-Leahy Scale: Good Standing balance comment: Generalized LE weakness                           ADL either performed or assessed with clinical judgement   ADL Overall ADL's : Needs assistance/impaired                                       General ADL Comments: Pt MOD I for don/doff socks, sitting EOB. Pt MAX A for don/doff cervical collar, sitting EOB. Pt SUP + VCs for simulated household task, lifting lightweight object  w/o breaking cervical precautions.      Pertinent Vitals/Pain Pain Assessment: 0-10 Pain Score: 3  Pain Location: LUE Pain Descriptors / Indicators: Aching;Discomfort Pain Intervention(s): Limited activity within patient's tolerance     Hand Dominance Right    Extremity/Trunk Assessment Upper Extremity Assessment Upper Extremity Assessment: LUE deficits/detail LUE Deficits / Details: grip 3+, deltoid 3, bicep 1, tricep 2   Lower Extremity Assessment Lower Extremity Assessment: Overall WFL for tasks assessed LLE Deficits / Details: Poor clearance during ambulation but improved with RW   Cervical / Trunk Assessment Cervical / Trunk Assessment: Neck Surgery   Communication Communication Communication: No difficulties   Cognition Arousal/Alertness: Awake/alert Behavior During Therapy: WFL for tasks assessed/performed Overall Cognitive Status: Within Functional Limits for tasks assessed                                       General Comments       Exercises Exercises: Other exercises Total Joint Exercises  Other Exercises Other Exercises: Pt educ re: role of OT, cervical precautions, d/c recs, safety precautions Other Exercises: Sup<>sit, sit<>stand, functional task   Shoulder Instructions      Home Living Family/patient expects to be discharged to:: Private residence Living Arrangements: Spouse/significant other Available Help at Discharge: Available 24 hours/day;Family Type of Home: Apartment Home Access: Level entry     Home Layout: One level     Bathroom Shower/Tub: Teacher, early years/pre: Handicapped height Bathroom Accessibility: Yes   Home Equipment: Rollator (4 wheels);Cane - quad;Cane - single point   Additional Comments: No RW, all AD are wife's      Prior Functioning/Environment Prior Level of Function : Independent/Modified Independent             Mobility Comments: Did not used AD, no assistance ADLs Comments: Used mostly RUE due to LUE weakness, did not require assistance        OT Problem List: Decreased strength;Decreased range of motion;Decreased activity tolerance;Decreased knowledge of precautions      OT Treatment/Interventions: Self-care/ADL  training;Therapeutic exercise;DME and/or AE instruction;Therapeutic activities    OT Goals(Current goals can be found in the care plan section) Acute Rehab OT Goals Patient Stated Goal: to go home OT Goal Formulation: With patient Time For Goal Achievement: 01/11/21 Potential to Achieve Goals: Good ADL Goals Pt Will Perform Grooming: with modified independence;standing Pt Will Transfer to Toilet: with modified independence;ambulating;regular height toilet Additional ADL Goal #1: Pt will identify and implement 3/3 cervical safety precautions.  OT Frequency: Min 2X/week   Barriers to D/C:            Co-evaluation              AM-PAC OT "6 Clicks" Daily Activity     Outcome Measure Help from another person eating meals?: None Help from another person taking care of personal grooming?: A Little Help from another person toileting, which includes using toliet, bedpan, or urinal?: A Little Help from another person bathing (including washing, rinsing, drying)?: A Little Help from another person to put on and taking off regular upper body clothing?: A Little Help from another person to put on and taking off regular lower body clothing?: A Little 6 Click Score: 19   End of Session Equipment Utilized During Treatment: Cervical collar  Activity Tolerance: Patient tolerated treatment well Patient left: in bed;with family/visitor present  OT Visit Diagnosis: Muscle weakness (  generalized) (M62.81)                Time: 2633-3545 OT Time Calculation (min): 20 min Charges:  OT General Charges $OT Visit: 1 Visit OT Evaluation $OT Eval Moderate Complexity: 1 Mod  Nino Glow, Markus Daft 12/28/2020, 3:22 PM

## 2020-12-28 NOTE — Progress Notes (Signed)
Per Dr. Lacinda Axon , resume xarelto in 7 days

## 2020-12-28 NOTE — Evaluation (Signed)
Physical Therapy Evaluation Patient Details Name: Douglas Mercer MRN: [id number] DOB: [birth year] Today's Date: 12/28/2020  History of Present Illness  Pt is a 75 y.o. male with the diagnosis of myelopathy and cervical radiculopathy s/p C3-6 anterior cervical decompression/fusion. PMH includes: DM, DVT, seizures/epilepsy, glaucoma, HTN, and PE.   Clinical Impression  Pt was pleasant and motivated to participate during the session and put forth good effort throughout. Upon entering the room, pt was on 2L/O2. SpO2 was 100% and HR was 89. Spoke with nurse and removed Ronan. SpO2 remained WNL at 95% with HR at 95 in supine. Pt was able to complete all ther ex supine, EOB, and standing with SpO2 at 98% and HR at 89 in sitting. Pt was able to ambulate with RW and CGA in hallway and vitals staying WNL. It was noted the pt had poor clearance of LLE while ambulating due to weakness, but this improved with the RW. After ambulation, SpO2 was 96% and HR was 116. Pt will benefit from HHPT upon discharge to safely address deficits listed in patient problem list for decreased caregiver assistance and eventual return to PLOF.   Recommendations for follow up therapy are one component of a multi-disciplinary discharge planning process, led by the attending physician.  Recommendations may be updated based on patient status, additional functional criteria and insurance authorization.  Follow Up Recommendations Home health PT    Assistance Recommended at Discharge Frequent or constant Supervision/Assistance  Functional Status Assessment Patient has had a recent decline in their functional status and demonstrates the ability to make significant improvements in function in a reasonable and predictable amount of time.  Equipment Recommendations  Rolling walker (2 wheels)    Recommendations for Other Services       Precautions / Restrictions Precautions Precautions: Cervical Precaution Booklet Issued: No Precaution  Comments: Apply/remove brace in sitting, can ambulate to bathroom without brace, can remove brace for shower, can remove brace in bed Required Braces or Orthoses: Cervical Brace Cervical Brace: Hard collar Restrictions Weight Bearing Restrictions: No      Mobility  Bed Mobility Overal bed mobility: Modified Independent             General bed mobility comments: Used bedrails, increased time and effort, HOB elevated    Transfers Overall transfer level: Needs assistance Equipment used: Rolling walker (2 wheels) Transfers: Sit to/from Stand;Bed to chair/wheelchair/BSC Sit to Stand: Supervision                Ambulation/Gait Ambulation/Gait assistance: Min guard Gait Distance (Feet): 20 Feet x1, 100 feet x1 Assistive device: Rolling walker (2 wheels);1 person hand held assist Gait Pattern/deviations: Step-through pattern;Decreased step length - right;Decreased step length - left;Decreased stride length Gait velocity: decreased     General Gait Details: Increased time and effort, poor clerance of LLE with hand helf assist but improved with RW  Stairs            Wheelchair Mobility    Modified Rankin (Stroke Patients Only)       Balance Overall balance assessment: Needs assistance Sitting-balance support: No upper extremity supported;Feet supported Sitting balance-Leahy Scale: Good     Standing balance support: No upper extremity supported;During functional activity Standing balance-Leahy Scale: Good Standing balance comment: Generalized LE weakness                             Pertinent Vitals/Pain Pain Assessment: 0-10 Pain Score: 3  Pain Location: LUE  Pain Descriptors / Indicators: Aching;Discomfort Pain Intervention(s): Limited activity within patient's tolerance    Home Living Family/patient expects to be discharged to:: Private residence Living Arrangements: Spouse/significant other Available Help at Discharge: Available 24  hours/day;Family Type of Home: Apartment Home Access: Level entry       Home Layout: One level Home Equipment: Rollator (4 wheels);Cane - quad;Cane - single point Additional Comments: No RW, all AD are wife's    Prior Function Prior Level of Function : Independent/Modified Independent             Mobility Comments: Did not used AD, no assistance ADLs Comments: Used mostly RUE due to LUE weakness, did not require assistance     Hand Dominance   Dominant Hand: Right    Extremity/Trunk Assessment   Upper Extremity Assessment Upper Extremity Assessment: LUE deficits/detail LUE Deficits / Details: grip 3+, deltoid 3, bicep 1, tricep 2    Lower Extremity Assessment Lower Extremity Assessment: Overall WFL for tasks assessed LLE Deficits / Details: Poor clearance during ambulation but improved with RW    Cervical / Trunk Assessment Cervical / Trunk Assessment: Neck Surgery  Communication   Communication: No difficulties  Cognition Arousal/Alertness: Awake/alert Behavior During Therapy: WFL for tasks assessed/performed Overall Cognitive Status: Within Functional Limits for tasks assessed                                          General Comments      Exercises Total Joint Exercises Ankle Circles/Pumps: AROM;Strengthening;Both;10 reps;Supine Quad Sets: AROM;Strengthening;Both;Supine;10 reps Gluteal Sets: AROM;Strengthening;Both;10 reps;Supine Long Arc Quad: AROM;Strengthening;Both;10 reps;Seated Marching in Standing: AROM;Strengthening;Both;10 reps;Standing Other Exercises Other Exercises: Pt educ re: role of OT, cervical precautions, d/c recs, safety precautions Other Exercises: Sup<>sit, sit<>stand, functional task   Assessment/Plan    PT Assessment Patient needs continued PT services  PT Problem List Decreased strength;Decreased mobility;Decreased balance;Decreased knowledge of use of DME;Decreased knowledge of precautions       PT  Treatment Interventions DME instruction;Gait training;Functional mobility training;Therapeutic activities;Patient/family education;Neuromuscular re-education;Balance training;Therapeutic exercise    PT Goals (Current goals can be found in the Care Plan section)  Acute Rehab PT Goals Patient Stated Goal: To use LUE bicep again PT Goal Formulation: With patient Time For Goal Achievement: 01/10/21 Potential to Achieve Goals: Good    Frequency BID   Barriers to discharge        Co-evaluation               AM-PAC PT "6 Clicks" Mobility  Outcome Measure Help needed turning from your back to your side while in a flat bed without using bedrails?: A Little Help needed moving from lying on your back to sitting on the side of a flat bed without using bedrails?: A Little Help needed moving to and from a bed to a chair (including a wheelchair)?: A Little Help needed standing up from a chair using your arms (e.g., wheelchair or bedside chair)?: A Little Help needed to walk in hospital room?: A Little Help needed climbing 3-5 steps with a railing? : A Little 6 Click Score: 18    End of Session Equipment Utilized During Treatment: Gait belt;Cervical collar Activity Tolerance: Patient tolerated treatment well Patient left: in bed;with family/visitor present;with call bell/phone within reach;with bed alarm set Nurse Communication: Mobility status PT Visit Diagnosis: Unsteadiness on feet (R26.81);Difficulty in walking, not elsewhere classified (R26.2);Muscle weakness (generalized) (M62.81)  Time: 1020-1050 PT Time Calculation (min) (ACUTE ONLY): 30 min   Charges:              Sheldon Silvan SPT 12/28/20, 1:50 PM

## 2020-12-28 NOTE — Progress Notes (Signed)
Foley removed by Levada Dy RN

## 2020-12-29 NOTE — Discharge Summary (Signed)
Physician Discharge Summary   Patient name: Douglas Mercer  Admit date:     12/27/2020  Discharge date: 12/28/2020  Discharge Physician: Loleta Dicker   PCP: Leonel Ramsay, MD     Discharge Diagnoses Active Problems:   Cervical radiculopathy   Resolved Diagnoses Resolved Problems:   * No resolved hospital problems. [facility]    Douglas Mercer is a 75 y.o presenting with cervical myelopathy s/p C3-6 ACDF by Dr. Lacinda Axon on 12/27/20. He was monitored overnight in the PACU and discharged home on POD#1 after ambulating, urinating, and tolerating PO intake. He was given prescriptions for pain medication   Procedures performed: C3-6 ACDF  Condition at discharge: good  Exam Physical Exam  AA Ox3 CNI Strength:5/5 throughout except 4-/5 left deltoid and 2/5 left bicep Incision is clean dry intact with Dermabond in place  Disposition: Home   Follow-up Information     Deetta Perla, MD Follow up on 01/11/2021.   Specialty: Neurosurgery Why: call office for appointment time. Contact information: [place] [address] [phone]                 Allergies as of 12/28/2020   No Known Allergies      Medication List     STOP taking these medications    aspirin 81 MG tablet   fish oil-omega-3 fatty acids 1000 MG capsule   Xarelto 20 MG Tabs tablet Generic drug: rivaroxaban       TAKE these medications    atorvastatin 10 MG tablet Commonly known as: LIPITOR Take 1 tablet (10 mg total) by mouth daily at 6 PM.   Co-Enzyme Q-10 100 MG Caps Take 100 mg by mouth daily.   finasteride 5 MG tablet Commonly known as: PROSCAR Take 1 tablet (5 mg total) by mouth daily. What changed: when to take this   fluticasone 50 MCG/ACT nasal spray Commonly known as: FLONASE Place 2 sprays into the nose daily. What changed:  when to take this reasons to take this   glipiZIDE 5 MG 24 hr tablet Commonly known as: GLUCOTROL  XL TAKE 1 TABLET(5 MG) BY MOUTH DAILY WITH BREAKFAST   latanoprost 0.005 % ophthalmic solution Commonly known as: XALATAN Place 1 drop into both eyes at bedtime.   levETIRAcetam 1000 MG tablet Commonly known as: KEPPRA Take 1 tablet (1,000 mg total) by mouth 2 (two) times daily.   losartan 25 MG tablet Commonly known as: COZAAR Take 25 mg by mouth daily.   metFORMIN 850 MG tablet Commonly known as: GLUCOPHAGE TAKE 1 TABLET TWICE A DAY  WITH A MEAL   methocarbamol 500 MG tablet Commonly known as: ROBAXIN Take 1 tablet (500 mg total) by mouth every 6 (six) hours as needed for muscle spasms.   oxyCODONE 5 MG immediate release tablet Commonly known as: Oxy IR/ROXICODONE Take 1 tablet (5 mg total) by mouth every 4 (four) hours as needed for up to 5 days for moderate pain ((score 4 to 6)).   pantoprazole 40 MG tablet Commonly known as: PROTONIX Take 1 tablet (40 mg total) by mouth daily.   Propylene Glycol 0.6 % Soln Place 1 drop into both eyes 2 (two) times daily as needed (dry eyes).   senna-docusate 8.6-50 MG tablet Commonly known as: Senokot-S Take 1 tablet by mouth at bedtime as needed for mild constipation.   vitamin B-12 1000 MCG tablet Commonly known as: CYANOCOBALAMIN Take 1,000 mcg by mouth daily.  DG Cervical Spine 1 View  Result Date: 12/27/2020 CLINICAL DATA:  Neck pain EXAM: DG CERVICAL SPINE - 1 VIEW COMPARISON:  None. FINDINGS: Fluoroscopic images show anterior surgical fusion from C3-C6 levels. Intervertebral disc spaces seen. Disc space narrowing and bony spurs are noted at C6-C7 level. IMPRESSION: Fluoroscopic assistance was provided for anterior surgical fusion from C3-C6 levels. Electronically Signed   By: Elmer Picker M.D.   On: 12/27/2020 13:40   CT CERVICAL SPINE WO CONTRAST  Result Date: 12/18/2020 CLINICAL DATA:  Left arm weakness with decreased use 3 months. Preop surgery. EXAM: CT CERVICAL SPINE WITHOUT CONTRAST TECHNIQUE:  Multidetector CT imaging of the cervical spine was performed without intravenous contrast. Multiplanar CT image reconstructions were also generated. COMPARISON:  MRI cervical spine 10/01/2020 FINDINGS: Alignment: Vertebral body alignment is within normal. Skull base and vertebrae: Vertebral body heights are maintained. There is moderate spondylosis throughout the cervical spine. There is moderate uncovertebral joint spurring and facet arthropathy. Minimal left-sided neural foraminal narrowing at the C2-3 level with significant bilateral neural foraminal narrowing from the C3-4 level to the C6-7 level. Soft tissues and spinal canal: Minimal decreased AP diameter of the right-side of the spinal canal at the C4-5 level with mild to moderate canal stenosis at the C5-6 and C6-7 levels. Prevertebral soft tissues are normal. Disc levels: Moderate disc space narrowing from the C3-4 level to the C6-7 level. Upper chest: No acute findings. Other: None. IMPRESSION: 1. No acute findings. 2. Moderate spondylosis throughout the cervical spine with moderate multilevel disc disease and significant bilateral neural foraminal narrowing as described from the C3-4 level to the C6-7 level. Mild to moderate canal stenosis at the C5-6 and C6-7 levels. Electronically Signed   By: Marin Olp M.D.   On: 12/18/2020 10:22   DG C-Arm 1-60 Min  Result Date: 12/27/2020 CLINICAL DATA:  surgery EXAM: DG C-ARM 1-60 MIN FLUOROSCOPY TIME:  Fluoroscopy Time:  8 seconds Radiation Exposure Index (if provided by the fluoroscopic device): 0.1904 mGy Number of Acquired Spot Images: 2 COMPARISON:  None. FINDINGS: Two C-arm fluoroscopic images were obtained intraoperatively and submitted for post operative interpretation. The first image demonstrates surgical needle with the tip projecting at the anterior C3-C4 interspace. Second image demonstrates postsurgical changes of ACDF spanning C3-C6 with intervening spacers. Similar anterolisthesis of C2 on  C3. Please see the performing provider's procedural report for further detail. IMPRESSION: Intraoperative fluoroscopy, as detailed above. Electronically Signed   By: Margaretha Sheffield M.D.   On: 12/27/2020 14:04   DG C-Arm 1-60 Min  Result Date: 12/27/2020 CLINICAL DATA:  surgery EXAM: DG C-ARM 1-60 MIN FLUOROSCOPY TIME:  Fluoroscopy Time:  8 seconds Radiation Exposure Index (if provided by the fluoroscopic device): 0.1904 mGy Number of Acquired Spot Images: 2 COMPARISON:  None. FINDINGS: Two C-arm fluoroscopic images were obtained intraoperatively and submitted for post operative interpretation. The first image demonstrates surgical needle with the tip projecting at the anterior C3-C4 interspace. Second image demonstrates postsurgical changes of ACDF spanning C3-C6 with intervening spacers. Similar anterolisthesis of C2 on C3. Please see the performing provider's procedural report for further detail. IMPRESSION: Intraoperative fluoroscopy, as detailed above. Electronically Signed   By: Margaretha Sheffield M.D.   On: 12/27/2020 14:04   DG C-Arm 1-60 Min  Result Date: 12/27/2020 CLINICAL DATA:  surgery EXAM: DG C-ARM 1-60 MIN FLUOROSCOPY TIME:  Fluoroscopy Time:  8 seconds Radiation Exposure Index (if provided by the fluoroscopic device): 0.1904 mGy Number of Acquired Spot Images: 2 COMPARISON:  None. FINDINGS: Two C-arm fluoroscopic images were obtained intraoperatively and submitted for post operative interpretation. The first image demonstrates surgical needle with the tip projecting at the anterior C3-C4 interspace. Second image demonstrates postsurgical changes of ACDF spanning C3-C6 with intervening spacers. Similar anterolisthesis of C2 on C3. Please see the performing provider's procedural report for further detail. IMPRESSION: Intraoperative fluoroscopy, as detailed above. Electronically Signed   By: Margaretha Sheffield M.D.   On: 12/27/2020 14:04   DG C-Arm 1-60 Min  Result Date: 12/27/2020 CLINICAL  DATA:  surgery EXAM: DG C-ARM 1-60 MIN FLUOROSCOPY TIME:  Fluoroscopy Time:  8 seconds Radiation Exposure Index (if provided by the fluoroscopic device): 0.1904 mGy Number of Acquired Spot Images: 2 COMPARISON:  None. FINDINGS: Two C-arm fluoroscopic images were obtained intraoperatively and submitted for post operative interpretation. The first image demonstrates surgical needle with the tip projecting at the anterior C3-C4 interspace. Second image demonstrates postsurgical changes of ACDF spanning C3-C6 with intervening spacers. Similar anterolisthesis of C2 on C3. Please see the performing provider's procedural report for further detail. IMPRESSION: Intraoperative fluoroscopy, as detailed above. Electronically Signed   By: Margaretha Sheffield M.D.   On: 12/27/2020 14:04   Results for orders placed or performed during the hospital encounter of 12/24/20  SARS CORONAVIRUS 2 (TAT 6-24 HRS) Nasopharyngeal Nasopharyngeal Swab     Status: None   Collection Time: 12/24/20  9:55 AM   Specimen: Nasopharyngeal Swab  Result Value Ref Range Status   SARS Coronavirus 2 NEGATIVE NEGATIVE Final    Comment: (NOTE) SARS-CoV-2 target nucleic acids are NOT DETECTED.  The SARS-CoV-2 RNA is generally detectable in upper and lower respiratory specimens during the acute phase of infection. Negative results do not preclude SARS-CoV-2 infection, do not rule out co-infections with other pathogens, and should not be used as the sole basis for treatment or other patient management decisions. Negative results must be combined with clinical observations, patient history, and epidemiological information. The expected result is Negative.  Fact Sheet for Patients: SugarRoll.be  Fact Sheet for Healthcare Providers: https://www.woods-mathews.com/  This test is not yet approved or cleared by the [place] FDA and  has been authorized for detection and/or diagnosis of SARS-CoV-2  by FDA under an Emergency Use Authorization (EUA). This EUA will remain  in effect (meaning this test can be used) for the duration of the COVID-19 declaration under Se ction 564(b)(1) of the Act, 21 U.S.C. section 360bbb-3(b)(1), unless the authorization is terminated or revoked sooner.  Performed at [facility], [place] [address] [place], [place] [address]     Signed:  Loleta Dicker PA-C Neurosurgery 12/29/2020, 12:48 PM

## 2020-12-31 NOTE — Anesthesia Postprocedure Evaluation (Signed)
Anesthesia Post Note  Patient: Douglas Mercer  Procedure(s) Performed: C3-6 ANTERIOR CERVICAL DECOMPRESSION/DISCECTOMY FUSION 3 LEVELS  Patient location during evaluation: PACU Anesthesia Type: General Level of consciousness: awake and alert Pain management: pain level controlled Vital Signs Assessment: post-procedure vital signs reviewed and stable Respiratory status: spontaneous breathing, nonlabored ventilation, respiratory function stable and patient connected to nasal cannula oxygen Cardiovascular status: blood pressure returned to baseline and stable Postop Assessment: no apparent nausea or vomiting Anesthetic complications: no   No notable events documented.   Last Vitals:  Vitals:   12/28/20 0741 12/28/20 1141  BP: 127/64 125/70  Pulse: 81 79  Resp: 17 16  Temp: 36.9 C 36.9 C  SpO2: 100% 99%    Last Pain:  Vitals:   12/28/20 1141  TempSrc: Temporal  PainSc:                  Martha Clan

## 2021-02-10 ENCOUNTER — Other Ambulatory Visit: Payer: Self-pay | Admitting: Infectious Diseases

## 2021-02-10 DIAGNOSIS — E1142 Type 2 diabetes mellitus with diabetic polyneuropathy: Secondary | ICD-10-CM

## 2021-02-10 DIAGNOSIS — R634 Abnormal weight loss: Secondary | ICD-10-CM

## 2021-02-16 ENCOUNTER — Other Ambulatory Visit: Payer: Self-pay | Admitting: Nurse Practitioner

## 2021-02-16 DIAGNOSIS — R634 Abnormal weight loss: Secondary | ICD-10-CM

## 2021-02-16 DIAGNOSIS — K219 Gastro-esophageal reflux disease without esophagitis: Secondary | ICD-10-CM

## 2021-02-16 DIAGNOSIS — K8020 Calculus of gallbladder without cholecystitis without obstruction: Secondary | ICD-10-CM

## 2021-03-10 ENCOUNTER — Ambulatory Visit: Payer: Medicare HMO

## 2021-03-11 ENCOUNTER — Other Ambulatory Visit: Payer: Self-pay | Admitting: Infectious Diseases

## 2021-03-11 DIAGNOSIS — K219 Gastro-esophageal reflux disease without esophagitis: Secondary | ICD-10-CM

## 2021-03-11 DIAGNOSIS — R634 Abnormal weight loss: Secondary | ICD-10-CM

## 2021-03-11 DIAGNOSIS — I10 Essential (primary) hypertension: Secondary | ICD-10-CM

## 2021-03-14 ENCOUNTER — Other Ambulatory Visit: Payer: Medicare Other

## 2021-03-15 ENCOUNTER — Ambulatory Visit
Admission: RE | Admit: 2021-03-15 | Discharge: 2021-03-15 | Disposition: A | Discharge status: Home / Self Care | Payer: Medicare HMO | Source: Ambulatory Visit | Attending: Infectious Diseases | Admitting: Infectious Diseases

## 2021-03-15 DIAGNOSIS — R634 Abnormal weight loss: Secondary | ICD-10-CM | POA: Insufficient documentation

## 2021-03-15 DIAGNOSIS — K219 Gastro-esophageal reflux disease without esophagitis: Secondary | ICD-10-CM | POA: Insufficient documentation

## 2021-03-15 DIAGNOSIS — I10 Essential (primary) hypertension: Secondary | ICD-10-CM | POA: Diagnosis present

## 2021-03-15 IMAGING — CT CT ABD-PELV W/ CM
2 of 5 series · 15 of 46 positions shown, 17 images · IV contrast (APPLIED)
Comparison: [DATE] from [HOSPITAL]

CLINICAL DATA: Unexplained 30 lb weight loss in past 3 months.

EXAM:
CT ABDOMEN AND PELVIS WITH CONTRAST
TECHNIQUE: Multidetector CT imaging of the abdomen and pelvis was performed
using the standard protocol following bolus administration of
intravenous contrast.

[Series 2: routine abd/pel with · axial · 0.69mm/px · z∈[-307,+118]mm · 12 of 97 slices shown, 14 images]
[im 6/97  soft-tissue]
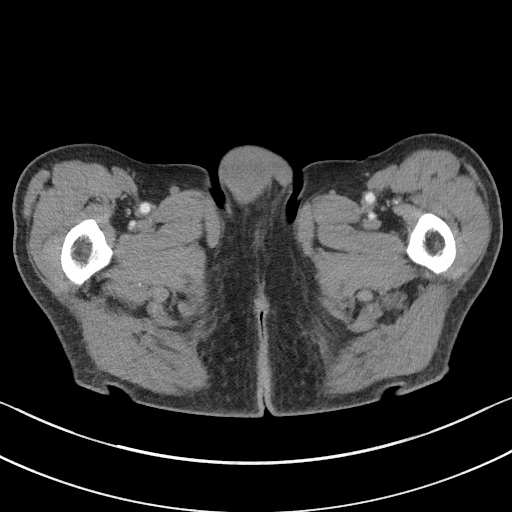
[im 6/97  bone]
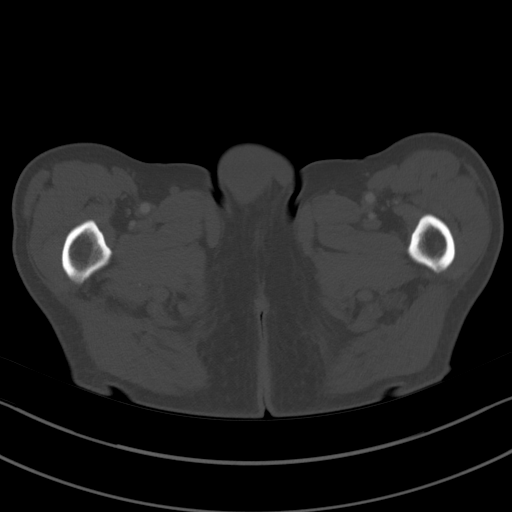
[im 17/97  soft-tissue]
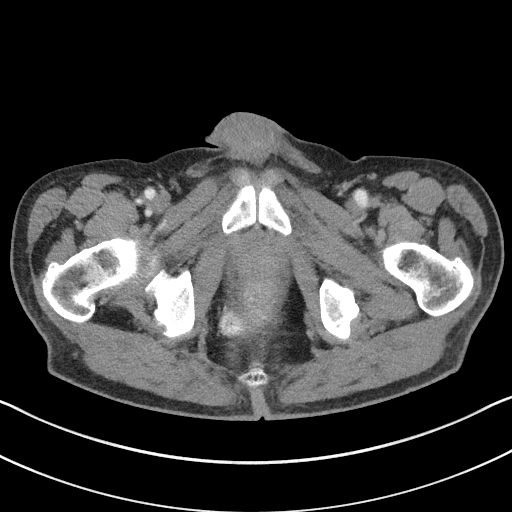
[im 22/97  soft-tissue]
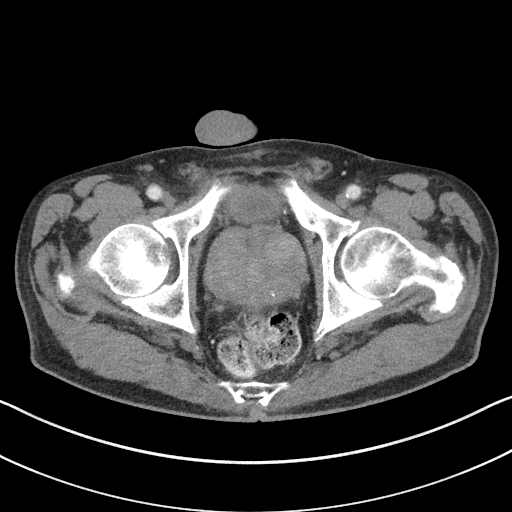
[im 27/97  soft-tissue]
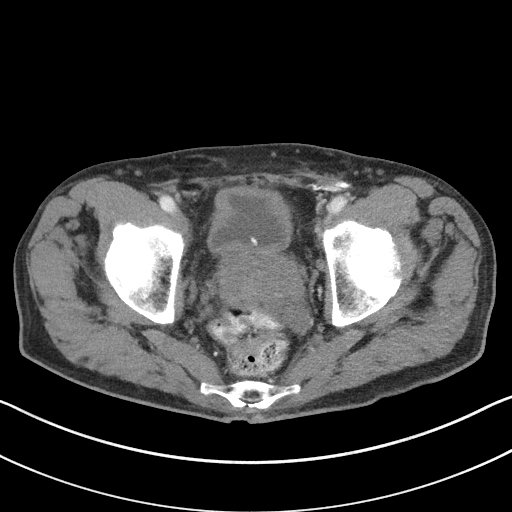
[im 38/97  soft-tissue]
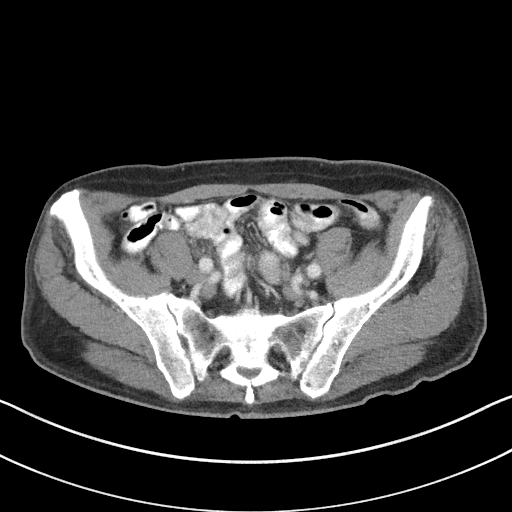
[im 43/97  soft-tissue]
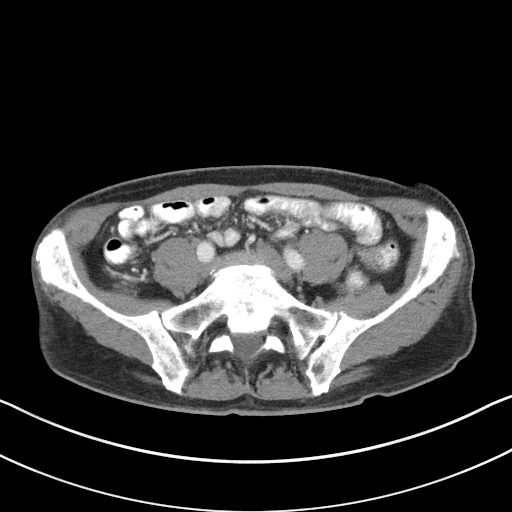
[im 54/97  soft-tissue]
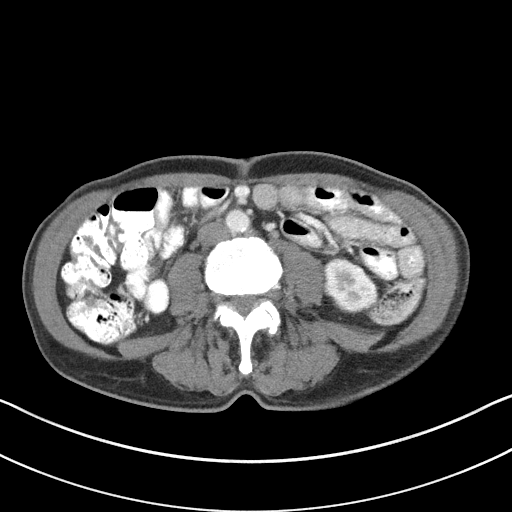
[im 59/97  soft-tissue]
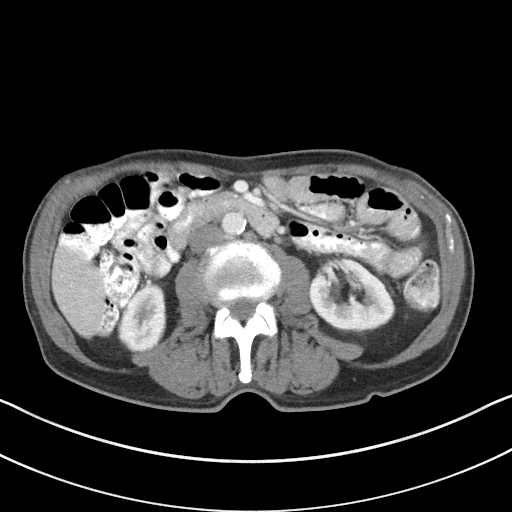
[im 70/97  soft-tissue]
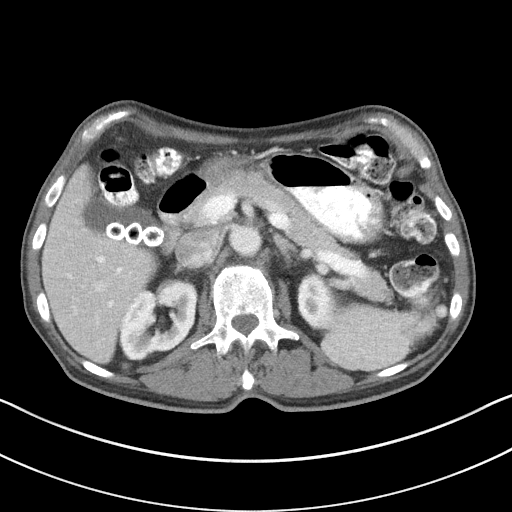
[im 70/97  bone]
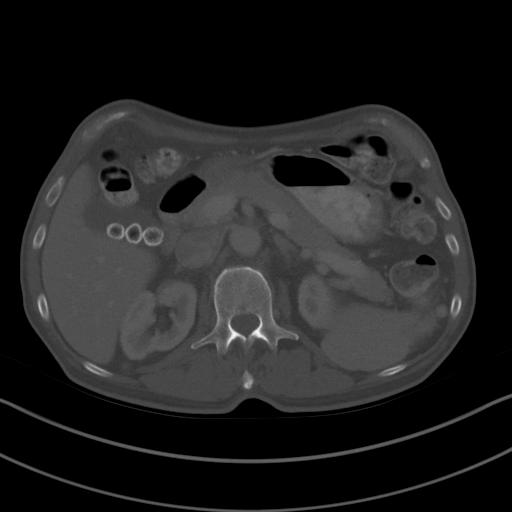
[im 75/97  soft-tissue]
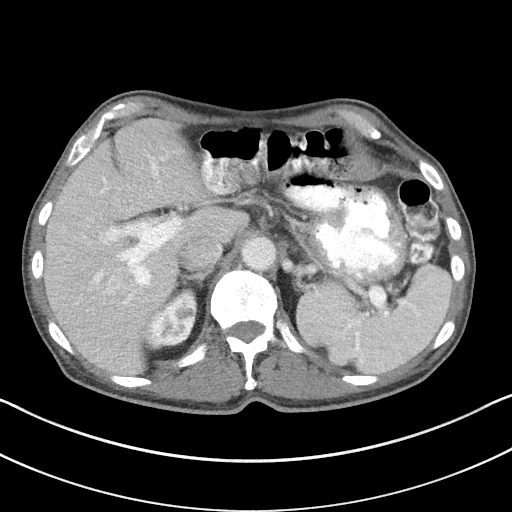
[im 81/97  soft-tissue]
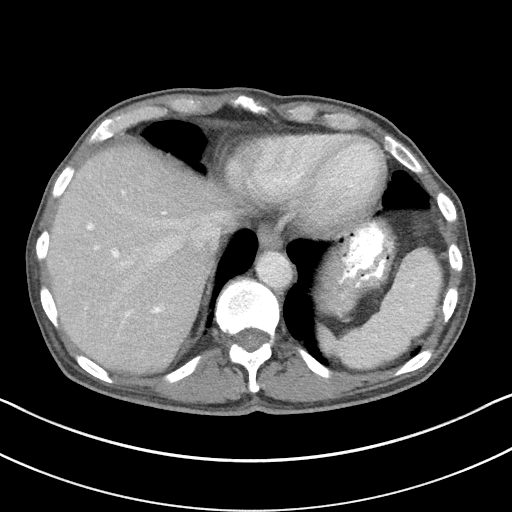
[im 91/97  soft-tissue]
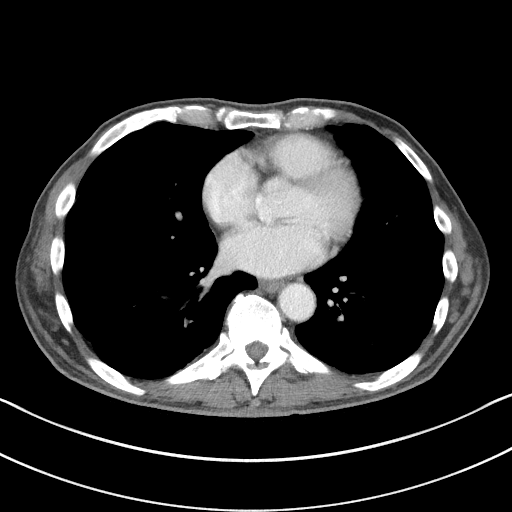

[Series 5: coronal st · coronal · 0.68mm/px · 3 of 75 slices shown]
[im 25/75  soft-tissue]
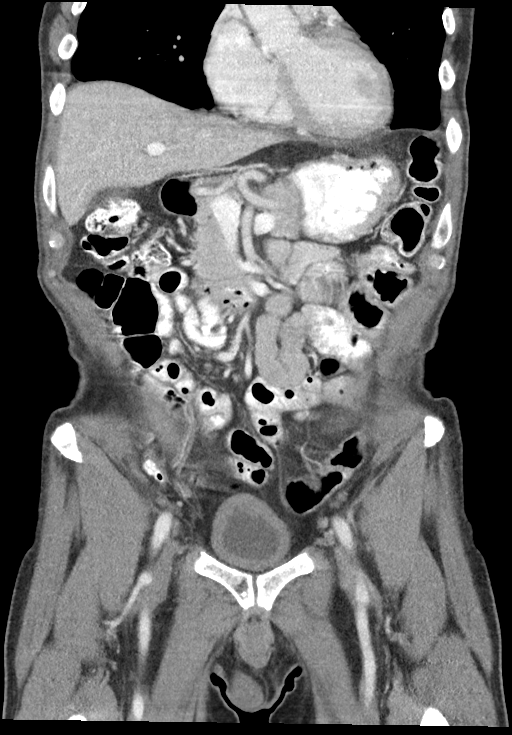
[im 33/75  soft-tissue]
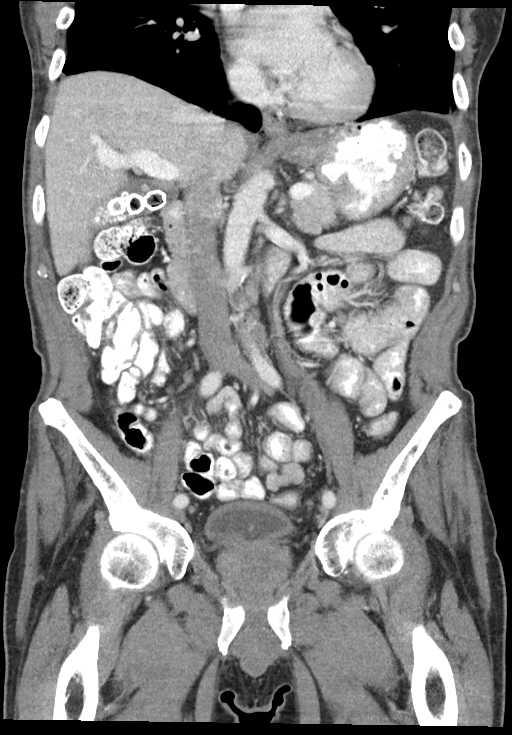
[im 42/75  soft-tissue]
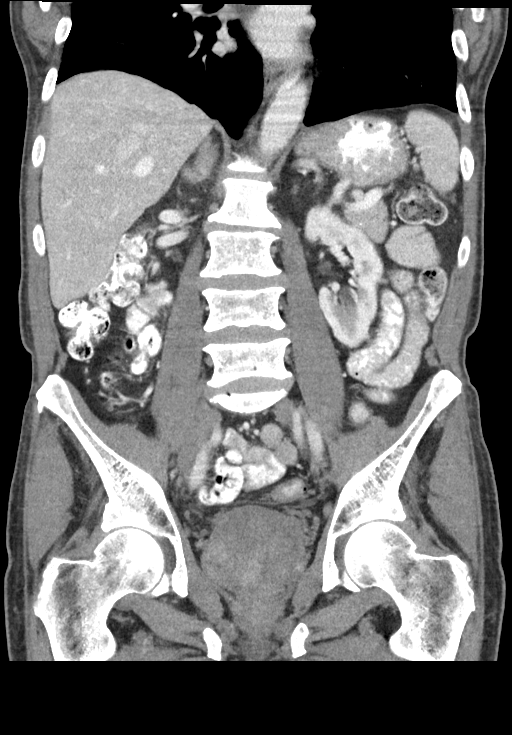

[15 of 46 positions shown; findings below may reference images not displayed]

RADIATION DOSE REDUCTION: This exam was performed according to the
departmental dose-optimization program which includes automated
exposure control, adjustment of the mA and/or kV according to
patient size and/or use of iterative reconstruction technique.

CONTRAST:  100mL OMNIPAQUE IOHEXOL 300 MG/ML  SOLN
FINDINGS: Lower Chest: No acute findings.  Bibasilar scarring noted.

Hepatobiliary: No hepatic masses identified. Several gallstones are
seen measuring up to 1.6 cm in size. No evidence of cholecystitis or
biliary ductal dilatation.

Pancreas:  No mass or inflammatory changes.

Spleen: Within normal limits in size and appearance.

Adrenals/Urinary Tract: A few tiny renal cysts noted bilaterally. No
masses identified. No evidence of ureteral calculi or
hydronephrosis. Diffuse bladder wall thickening is seen, likely due
to chronic bladder outlet obstruction given enlarged prostate.

Stomach/Bowel: No evidence of obstruction, inflammatory process or
abnormal fluid collections. Mild diverticulosis is seen involving
the distal sigmoid colon, however there is no evidence of
diverticulitis.

Vascular/Lymphatic: No pathologically enlarged lymph nodes
identified. No acute vascular findings. Aortic atherosclerotic
calcification noted.

Reproductive: Markedly enlarged prostate gland shows further mild
increase in size since previous study. Several prostatic
calcifications are noted in the median lobe at the bladder base,
without significant change.

Other:  None.

Musculoskeletal:  No suspicious bone lesions identified.
IMPRESSION: Markedly enlarged prostate gland, with further increase in size
since [WL] exam. Findings of chronic bladder outlet obstruction also
noted.

Cholelithiasis. No radiographic evidence of cholecystitis.

Mild sigmoid diverticulosis. No radiographic evidence of
diverticulitis.

Aortic Atherosclerosis ([WL]-[WL]).

## 2021-03-15 MED ORDER — IOHEXOL 300 MG/ML  SOLN
100.0000 mL | Freq: Once | INTRAMUSCULAR | Status: AC | PRN
  Administered 2021-03-15: 13:00:00 100 mL via INTRAVENOUS

## 2021-03-31 NOTE — Progress Notes (Signed)
04/01/21 11:02 AM   Douglas Mercer 11/08/1945 [id number]  Referring provider:  Leonel Ramsay, MD [place],  [place] [address] Chief Complaint  Patient presents with   New Patient (Initial Visit)   Benign Prostatic Hypertrophy     HPI: Douglas Mercer is a 76 y.o.male who presents today for further evaluation of BPH with LUTS.   He has a history of BPH and is status post TURP x2. He has had previous biopsies of the prostate that were all benign.   Recent imagine in the form of CT abdomen and pelvis on 03/15/2021 to further evaluate unexplained weight loss visualized markedly enlarged prostate gland shows further mild increase in size since previous study. Several prostatic calcifications are noted in the median lobe at the bladder base, without significant change.    He most recent PSA on 02/09/2021 was 4.02  He reports that he is originally from [place], recent lived in [place] for 3 to 4 years before that [facility] for a year or 2.  He is been followed intermittently mostly by [facility], last seen around 2018 but also had biopsies in 2016 which sound like saturation biopsies in [facility] in 2016.  All of these were negative.  He reports having at least 3 negative biopsies in the past.  He reports that his PSAs has been as high as 9 or 10 but is recently been in the 4 range on finasteride for which he has been on for the last several years.  He reports that he had a TURP twice.  He does not believe that these helped much with his urinary symptoms.  He continues on finasteride.  Overall today, he is pretty satisfied.  IPSS as below.  PVR 93 cc.    He is looking to reestablish care with urology.     IPSS     Row Name 04/01/21 1000         International Prostate Symptom Score   How often have you had the sensation of not emptying your bladder? About half the time     How often have you had to urinate less than every two hours?  Less than half the time     How often have you found you stopped and started again several times when you urinated? Less than 1 in 5 times     How often have you found it difficult to postpone urination? Less than 1 in 5 times     How often have you had a weak urinary stream? Less than 1 in 5 times     How often have you had to strain to start urination? Not at All     How many times did you typically get up at night to urinate? 1 Time     Total IPSS Score 9       Quality of Life due to urinary symptoms   If you were to spend the rest of your life with your urinary condition just the way it is now how would you feel about that? Mostly Satisfied              Score:  1-7 Mild 8-19 Moderate 20-35 Severe    PMH: Past Medical History:  Diagnosis Date   Allergic rhinitis    Anti-phospholipid antibody syndrome (HCC)    BPH (benign prostatic hyperplasia)    BPH (benign prostatic hypertrophy)    Deficiency of clotting factor (HCC)    Diabetes mellitus without  complication ([place])    DVT (deep venous thrombosis) ([place]) 02/2009   large left lower   ED (erectile dysfunction)    Epilepsy ([place])    last seizure 09/27/2017   GERD (gastroesophageal reflux disease)    Glaucoma    Hyperlipidemia    Hypertension    Internal hemorrhoids    Low HDL (under 40)    Pneumonia    Pulmonary embolism ([place]) 1992   bilateral w/ PE reportedly (+) lupus anticoagulant   Rosacea    Tubular adenoma of colon     Surgical History: Past Surgical History:  Procedure Laterality Date   [place] DECOMP/DISCECTOMY FUSION N/A 12/27/2020   Procedure: C3-6 ANTERIOR CERVICAL DECOMPRESSION/DISCECTOMY FUSION 3 LEVELS;  Surgeon: Deetta Perla, MD;  Location: [facility] ORS;  Service: Neurosurgery;  Laterality: N/A;   CATARACT EXTRACTION, BILATERAL     L eye on 04/24/17 and R eye on 05/08/17.   COLONOSCOPY  05/07/2006   colonoscopy with polypectomy  07/07/2002   2 small descending colon polyps    colonoscopy with polypectomy  04/19/2001   15mm sessile mucosal polyp in distal sigmoid colon   diverticulosis  07/07/2002,04/19/2001   mild sigmoid   ESOPHAGOGASTRODUODENOSCOPY     HERNIA REPAIR     1995 and 08/13/2009   [place] /2011   right-sided, mesh placed   PROSTATE SURGERY     18 biopsies.  Benign.   TONSILLECTOMY  1959   TRANSURETHRAL RESECTION OF PROSTATE  08/13/09   TRANSURETHRAL RESECTION OF PROSTATE  08/13/2009    Home Medications:  Allergies as of 04/01/2021   No Known Allergies      Medication List        Accurate as of April 01, 2021 11:02 AM. If you have any questions, ask your nurse or doctor.          STOP taking these medications    methocarbamol 500 MG tablet Commonly known as: ROBAXIN Stopped by: Despina Hidden, CMA   senna-docusate 8.6-50 MG tablet Commonly known as: Senokot-S Stopped by: Despina Hidden, CMA       TAKE these medications    atorvastatin 10 MG tablet Commonly known as: LIPITOR Take 1 tablet (10 mg total) by mouth daily at 6 PM.   Co-Enzyme Q-10 100 MG Caps Take 100 mg by mouth daily.   finasteride 5 MG tablet Commonly known as: PROSCAR Take 1 tablet (5 mg total) by mouth daily.   fluticasone 50 MCG/ACT nasal spray Commonly known as: FLONASE Place 2 sprays into the nose daily.   glipiZIDE 5 MG tablet Commonly known as: GLUCOTROL Take 5 mg by mouth daily. What changed: Another medication with the same name was removed. Continue taking this medication, and follow the directions you see here. Changed by: Hollice Espy, MD   latanoprost 0.005 % ophthalmic solution Commonly known as: XALATAN Place 1 drop into both eyes at bedtime.   levETIRAcetam 1000 MG tablet Commonly known as: KEPPRA Take 1 tablet (1,000 mg total) by mouth 2 (two) times daily.   losartan 25 MG tablet Commonly known as: COZAAR Take 25 mg by mouth daily.   metFORMIN 850 MG tablet Commonly known as:  GLUCOPHAGE TAKE 1 TABLET TWICE A DAY  WITH A MEAL   pantoprazole 40 MG tablet Commonly known as: PROTONIX Take 1 tablet (40 mg total) by mouth daily.   polyethylene glycol-electrolytes 420 g solution Commonly known as: NuLYTELY Take 4,000 mLs by mouth as  directed.   Propylene Glycol 0.6 % Soln Place 1 drop into both eyes 2 (two) times daily as needed (dry eyes).   vitamin B-12 1000 MCG tablet Commonly known as: CYANOCOBALAMIN Take 1,000 mcg by mouth daily.   Xarelto 20 MG Tabs tablet Generic drug: rivaroxaban Take 20 mg by mouth daily.        Allergies: No Known Allergies  Family History: Family History  Problem Relation Age of Onset   Diabetes Mother    Melanoma Mother    COPD Mother    COPD Father    Heart attack Maternal Uncle    Stroke Maternal Aunt        grandparents   Colon cancer Paternal Uncle        x2   Prostate cancer Neg Hx    Seizures Neg Hx    Esophageal cancer Neg Hx     Social History:  reports that he has never smoked. He has never been exposed to tobacco smoke. He has never used smokeless tobacco. He reports current alcohol use. He reports that he does not use drugs.   Physical Exam: BP (!) 154/93    Pulse (!) 105    Ht 5\' 9"  (1.753 m)    Wt 135 lb (61.2 kg)    BMI 19.94 kg/m   Constitutional:  Alert and oriented, No acute distress. HEENT: [place] AT, moist mucus membranes.  Trachea midline, no masses. Cardiovascular: No clubbing, cyanosis, or edema. Respiratory: Normal respiratory effort, no increased work of breathing. Rectal: Normal sphincter tone.  Massive prostate, approximately 80 g, rubbery, no nodules Skin: No rashes, bruises or suspicious lesions. Neurologic: Grossly intact, no focal deficits, moving all 4 extremities. Psychiatric: Normal mood and affect.  Laboratory Data: Lab Results  Component Value Date   CREATININE 1.01 12/20/2020   Lab Results  Component Value Date   PSA 3.56 05/08/2008   PSA 4.34 10/15/2007   PSA 3.24  02/08/2007   Lab Results  Component Value Date   HGBA1C 6.0 07/09/2017    Urinalysis Results for orders placed or performed during the hospital encounter of 04/01/21  Urinalysis, Complete w Microscopic  Result Value Ref Range   Color, Urine YELLOW YELLOW   APPearance CLEAR CLEAR   Specific Gravity, Urine 1.015 1.005 - 1.030   pH 6.5 5.0 - 8.0   Glucose, UA NEGATIVE NEGATIVE mg/dL   Hgb urine dipstick NEGATIVE NEGATIVE   Bilirubin Urine NEGATIVE NEGATIVE   Ketones, ur NEGATIVE NEGATIVE mg/dL   Protein, ur NEGATIVE NEGATIVE mg/dL   Nitrite NEGATIVE NEGATIVE   Leukocytes,Ua NEGATIVE NEGATIVE   Squamous Epithelial / LPF 0-5 0 - 5   WBC, UA 0-5 0 - 5 WBC/hpf   RBC / HPF 0-5 0 - 5 RBC/hpf   Bacteria, UA FEW (A) NONE SEEN     Pertinent Imaging: EXAM: CT ABDOMEN AND PELVIS WITH CONTRAST   TECHNIQUE: Multidetector CT imaging of the abdomen and pelvis was performed using the standard protocol following bolus administration of intravenous contrast.   RADIATION DOSE REDUCTION: This exam was performed according to the departmental dose-optimization program which includes automated exposure control, adjustment of the mA and/or kV according to patient size and/or use of iterative reconstruction technique.   CONTRAST:  114mL OMNIPAQUE IOHEXOL 300 MG/ML  SOLN   COMPARISON:  10/19/2011 from [facility]   FINDINGS: Lower Chest: No acute findings.  Bibasilar scarring noted.   Hepatobiliary: No hepatic masses identified. Several gallstones are seen measuring up to 1.6  cm in size. No evidence of cholecystitis or biliary ductal dilatation.   Pancreas:  No mass or inflammatory changes.   Spleen: Within normal limits in size and appearance.   Adrenals/Urinary Tract: A few tiny renal cysts noted bilaterally. No masses identified. No evidence of ureteral calculi or hydronephrosis. Diffuse bladder wall thickening is seen, likely due to chronic bladder outlet  obstruction given enlarged prostate.   Stomach/Bowel: No evidence of obstruction, inflammatory process or abnormal fluid collections. Mild diverticulosis is seen involving the distal sigmoid colon, however there is no evidence of diverticulitis.   Vascular/Lymphatic: No pathologically enlarged lymph nodes identified. No acute vascular findings. Aortic atherosclerotic calcification noted.   Reproductive: Markedly enlarged prostate gland shows further mild increase in size since previous study. Several prostatic calcifications are noted in the median lobe at the bladder base, without significant change.   Other:  None.   Musculoskeletal:  No suspicious bone lesions identified.   IMPRESSION: Markedly enlarged prostate gland, with further increase in size since 2013 exam. Findings of chronic bladder outlet obstruction also noted.   Cholelithiasis. No radiographic evidence of cholecystitis.   Mild sigmoid diverticulosis. No radiographic evidence of diverticulitis.   Aortic Atherosclerosis (ICD10-I70.0).     Electronically Signed   By: Marlaine Hind M.D.   On: 03/15/2021 21:48   Assessment & Plan:    1. Benign prostatic hyperplasia with urinary obstruction S/p TURP x 2  Will obtain records today from [facility], records release signed  Few bothersome urinary symptoms at this time on finasteride, will continue this medication - BLADDER SCAN AMB NON-IMAGING  2. Elevated PSA Likely appropriate based on age, size of prostate, etc.  Exam unremarkable today  Will obtain records as above, s/p multiple negative biopsies and including what sounds like saturation biopsies in the past  Will continue to f/u annually  - PSA; Future   1 year with IPSS/PVR/PSA/DRE (we will follow-up on records to confirm the above history)  [facility] [address], [place], [place] [address] [phone]

## 2021-04-01 ENCOUNTER — Other Ambulatory Visit: Payer: Self-pay | Admitting: *Deleted

## 2021-04-01 ENCOUNTER — Encounter: Payer: Self-pay | Admitting: Urology

## 2021-04-01 ENCOUNTER — Ambulatory Visit: Payer: Medicare HMO | Admitting: Urology

## 2021-04-01 ENCOUNTER — Other Ambulatory Visit
Admission: RE | Admit: 2021-04-01 | Discharge: 2021-04-01 | Disposition: A | Discharge status: Home / Self Care | Payer: Medicare HMO | Attending: Urology | Admitting: Urology

## 2021-04-01 ENCOUNTER — Other Ambulatory Visit: Payer: Self-pay

## 2021-04-01 VITALS — BP 154/93 | HR 105 | Ht 69.0 in | Wt 135.0 lb

## 2021-04-01 DIAGNOSIS — N401 Enlarged prostate with lower urinary tract symptoms: Secondary | ICD-10-CM | POA: Diagnosis not present

## 2021-04-01 DIAGNOSIS — R972 Elevated prostate specific antigen [PSA]: Secondary | ICD-10-CM | POA: Diagnosis not present

## 2021-04-01 DIAGNOSIS — N400 Enlarged prostate without lower urinary tract symptoms: Secondary | ICD-10-CM | POA: Diagnosis present

## 2021-04-01 LAB — URINALYSIS, COMPLETE (UACMP) WITH MICROSCOPIC
Bilirubin Urine: NEGATIVE
Glucose, UA: NEGATIVE mg/dL
Hgb urine dipstick: NEGATIVE
Ketones, ur: NEGATIVE mg/dL
Leukocytes,Ua: NEGATIVE
Nitrite: NEGATIVE
Protein, ur: NEGATIVE mg/dL
Specific Gravity, Urine: 1.015 (ref 1.005–1.030)
pH: 6.5 (ref 5.0–8.0)

## 2021-04-01 LAB — BLADDER SCAN AMB NON-IMAGING

## 2021-04-29 ENCOUNTER — Ambulatory Visit: Payer: Medicare HMO | Admitting: Urology

## 2021-05-02 ENCOUNTER — Encounter: Payer: Self-pay | Admitting: *Deleted

## 2021-05-03 ENCOUNTER — Encounter
Admission: RE | Disposition: A | Discharge status: Home / Self Care | Payer: Self-pay | Source: Home / Self Care | Attending: Gastroenterology

## 2021-05-03 ENCOUNTER — Ambulatory Visit
Admission: RE | Admit: 2021-05-03 | Discharge: 2021-05-03 | Disposition: A | Discharge status: Home / Self Care | Payer: Medicare HMO | Attending: Gastroenterology | Admitting: Gastroenterology

## 2021-05-03 ENCOUNTER — Ambulatory Visit: Payer: Medicare HMO | Admitting: Anesthesiology

## 2021-05-03 ENCOUNTER — Encounter: Payer: Self-pay | Admitting: *Deleted

## 2021-05-03 ENCOUNTER — Other Ambulatory Visit: Payer: Self-pay

## 2021-05-03 DIAGNOSIS — E119 Type 2 diabetes mellitus without complications: Secondary | ICD-10-CM | POA: Insufficient documentation

## 2021-05-03 DIAGNOSIS — I10 Essential (primary) hypertension: Secondary | ICD-10-CM | POA: Diagnosis not present

## 2021-05-03 DIAGNOSIS — K640 First degree hemorrhoids: Secondary | ICD-10-CM | POA: Diagnosis not present

## 2021-05-03 DIAGNOSIS — Z86711 Personal history of pulmonary embolism: Secondary | ICD-10-CM | POA: Diagnosis not present

## 2021-05-03 DIAGNOSIS — Z09 Encounter for follow-up examination after completed treatment for conditions other than malignant neoplasm: Secondary | ICD-10-CM | POA: Diagnosis present

## 2021-05-03 DIAGNOSIS — K5730 Diverticulosis of large intestine without perforation or abscess without bleeding: Secondary | ICD-10-CM | POA: Diagnosis not present

## 2021-05-03 DIAGNOSIS — K219 Gastro-esophageal reflux disease without esophagitis: Secondary | ICD-10-CM | POA: Diagnosis not present

## 2021-05-03 DIAGNOSIS — D759 Disease of blood and blood-forming organs, unspecified: Secondary | ICD-10-CM | POA: Diagnosis not present

## 2021-05-03 DIAGNOSIS — R1310 Dysphagia, unspecified: Secondary | ICD-10-CM | POA: Diagnosis not present

## 2021-05-03 DIAGNOSIS — Z86718 Personal history of other venous thrombosis and embolism: Secondary | ICD-10-CM | POA: Diagnosis not present

## 2021-05-03 DIAGNOSIS — Q438 Other specified congenital malformations of intestine: Secondary | ICD-10-CM | POA: Insufficient documentation

## 2021-05-03 DIAGNOSIS — D509 Iron deficiency anemia, unspecified: Secondary | ICD-10-CM | POA: Diagnosis not present

## 2021-05-03 DIAGNOSIS — R569 Unspecified convulsions: Secondary | ICD-10-CM | POA: Insufficient documentation

## 2021-05-03 DIAGNOSIS — Z981 Arthrodesis status: Secondary | ICD-10-CM | POA: Insufficient documentation

## 2021-05-03 HISTORY — PX: COLONOSCOPY WITH PROPOFOL: SHX5780

## 2021-05-03 HISTORY — PX: ESOPHAGOGASTRODUODENOSCOPY (EGD) WITH PROPOFOL: SHX5813

## 2021-05-03 SURGERY — ESOPHAGOGASTRODUODENOSCOPY (EGD) WITH PROPOFOL
Anesthesia: General

## 2021-05-03 MED ORDER — PROPOFOL 10 MG/ML IV BOLUS
INTRAVENOUS | Status: DC | PRN
  Administered 2021-05-03: 80 mg via INTRAVENOUS

## 2021-05-03 MED ORDER — LIDOCAINE HCL (CARDIAC) PF 100 MG/5ML IV SOSY
PREFILLED_SYRINGE | INTRAVENOUS | Status: DC | PRN
  Administered 2021-05-03: 100 mg via INTRAVENOUS

## 2021-05-03 MED ORDER — SODIUM CHLORIDE 0.9 % IV SOLN: INTRAVENOUS | Status: DC

## 2021-05-03 MED ORDER — PROPOFOL 500 MG/50ML IV EMUL
INTRAVENOUS | Status: DC | PRN
  Administered 2021-05-03: 150 ug/kg/min via INTRAVENOUS

## 2021-05-03 MED ORDER — PHENYLEPHRINE HCL-NACL 20-0.9 MG/250ML-% IV SOLN
INTRAVENOUS | Status: AC
  Filled 2021-05-03: qty 250

## 2021-05-03 MED ORDER — STERILE WATER FOR IRRIGATION IR SOLN
Status: DC | PRN
  Administered 2021-05-03: 360 mL

## 2021-05-03 MED ORDER — PROPOFOL 500 MG/50ML IV EMUL
INTRAVENOUS | Status: AC
  Filled 2021-05-03: qty 50

## 2021-05-03 NOTE — H&P (Signed)
Outpatient short stay form Pre-procedure ?05/03/2021  ?Lesly Rubenstein, MD ? ?Primary Physician: Leonel Ramsay, MD ? ?Reason for visit:  GERD/Personal history of polyps ? ?History of present illness:   ? ?76 y/o gentleman with history of DM II and blood clots here for EGD/Colonoscopy for GERD and history of polyps. Had some mild dysphagia after neck fusion which is not unexpected. Takes xarelto with last dose being 5 days ago. No family history of GI malignancies. History of inguinal hernia repair. ? ? ? ?Current Facility-Administered Medications:  ?  0.9 %  sodium chloride infusion, , Intravenous, Continuous, Arjan Strohm, Hilton Cork, MD, Last Rate: 20 mL/hr at 05/03/21 0739, New Bag at 05/03/21 0739 ? ?Medications Prior to Admission  ?Medication Sig Dispense Refill Last Dose  ? aspirin EC 81 MG tablet Take 81 mg by mouth daily. Swallow whole.   Past Week  ? atorvastatin (LIPITOR) 10 MG tablet Take 1 tablet (10 mg total) by mouth daily at 6 PM. 90 tablet 0 05/02/2021  ? Co-Enzyme Q-10 100 MG CAPS Take 100 mg by mouth daily.   Past Week  ? finasteride (PROSCAR) 5 MG tablet Take 1 tablet (5 mg total) by mouth daily. 90 tablet 3 05/02/2021  ? levETIRAcetam (KEPPRA) 1000 MG tablet Take 1 tablet (1,000 mg total) by mouth 2 (two) times daily. 28 tablet 0 05/03/2021 at 0600  ? losartan (COZAAR) 25 MG tablet Take 25 mg by mouth daily.   05/02/2021  ? metFORMIN (GLUCOPHAGE) 850 MG tablet TAKE 1 TABLET TWICE A DAY  WITH A MEAL 180 tablet 1 05/02/2021  ? pantoprazole (PROTONIX) 40 MG tablet Take 1 tablet (40 mg total) by mouth daily. 90 tablet 3 05/02/2021  ? vitamin B-12 (CYANOCOBALAMIN) 1000 MCG tablet Take 1,000 mcg by mouth daily.   Past Week  ? fluticasone (FLONASE) 50 MCG/ACT nasal spray Place 2 sprays into the nose daily. 48 g 0   ? glipiZIDE (GLUCOTROL) 5 MG tablet Take 5 mg by mouth daily. (Patient not taking: Reported on 05/03/2021)   Not Taking  ? latanoprost (XALATAN) 0.005 % ophthalmic solution Place 1 drop into both  eyes at bedtime.  2   ? polyethylene glycol-electrolytes (NULYTELY) 420 g solution Take 4,000 mLs by mouth as directed.     ? Propylene Glycol 0.6 % SOLN Place 1 drop into both eyes 2 (two) times daily as needed (dry eyes).     ? XARELTO 20 MG TABS tablet Take 20 mg by mouth daily.   04/27/2021  ? ? ? ?No Known Allergies ? ? ?Past Medical History:  ?Diagnosis Date  ? Allergic rhinitis   ? Anti-phospholipid antibody syndrome (HCC)   ? BPH (benign prostatic hyperplasia)   ? BPH (benign prostatic hypertrophy)   ? Deficiency of clotting factor ([place])   ? Diabetes mellitus without complication ([place])   ? DVT (deep venous thrombosis) ([place]) 02/2009  ? large left lower  ? ED (erectile dysfunction)   ? Epilepsy ([place])   ? last seizure 09/27/2017  ? GERD (gastroesophageal reflux disease)   ? Glaucoma   ? Hyperlipidemia   ? Hypertension   ? Internal hemorrhoids   ? Low HDL (under 40)   ? Pneumonia   ? Pulmonary embolism ([place]) 1992  ? bilateral w/ PE reportedly (+) lupus anticoagulant  ? Rosacea   ? Tubular adenoma of colon   ? ? ?Review of systems:  Otherwise negative.  ? ? ?Physical Exam ? ?Gen: Alert, oriented. Appears stated age.  ?  HEENT: PERRLA. ?Lungs: No respiratory distress ?CV: RRR ?Abd: soft, benign, no masses ?Ext: No edema ? ? ? ?Planned procedures: Proceed with EGD/colonoscopy. The patient understands the nature of the planned procedure, indications, risks, alternatives and potential complications including but not limited to bleeding, infection, perforation, damage to internal organs and possible oversedation/side effects from anesthesia. The patient agrees and gives consent to proceed.  ?Please refer to procedure notes for findings, recommendations and patient disposition/instructions.  ? ? ? ?Lesly Rubenstein, MD ?[facility] ? ? ? ?  ? ?

## 2021-05-03 NOTE — Op Note (Signed)
[facility] ?Gastroenterology ?Patient Name: Douglas Mercer ?Procedure Date: 05/03/2021 7:38 AM ?MRN: [id number] ?Account #: [id number] ?Date of Birth: 30-Jul-1945 ?Admit Type: Outpatient ?Age: 76 ?Room: [facility] ENDO ROOM 1 ?Gender: Male ?Note Status: Finalized ?Instrument Name: Upper Endoscope [id number] ?Procedure:             Upper GI endoscopy ?Indications:           Iron deficiency anemia, Gastro-esophageal reflux  ?                       disease ?Providers:             Andrey Farmer MD, MD ?Referring MD:          Adrian Prows (Referring MD) ?Medicines:             Monitored Anesthesia Care ?Complications:         No immediate complications. ?Procedure:             Pre-Anesthesia Assessment: ?                       - Prior to the procedure, a History and Physical was  ?                       performed, and patient medications and allergies were  ?                       reviewed. The patient is competent. The risks and  ?                       benefits of the procedure and the sedation options and  ?                       risks were discussed with the patient. All questions  ?                       were answered and informed consent was obtained.  ?                       Patient identification and proposed procedure were  ?                       verified by the physician, the nurse, the  ?                       anesthesiologist, the anesthetist and the technician  ?                       in the endoscopy suite. Mental Status Examination:  ?                       alert and oriented. Airway Examination: normal  ?                       oropharyngeal airway and neck mobility. Respiratory  ?                       Examination: clear to auscultation. CV Examination:  ?  normal. Prophylactic Antibiotics: The patient does not  ?                       require prophylactic antibiotics. Prior  ?                       Anticoagulants: The patient has taken Xarelto  ?                        (rivaroxaban), last dose was 5 days prior to  ?                       procedure. ASA Grade Assessment: II - A patient with  ?                       mild systemic disease. After reviewing the risks and  ?                       benefits, the patient was deemed in satisfactory  ?                       condition to undergo the procedure. The anesthesia  ?                       plan was to use monitored anesthesia care (MAC).  ?                       Immediately prior to administration of medications,  ?                       the patient was re-assessed for adequacy to receive  ?                       sedatives. The heart rate, respiratory rate, oxygen  ?                       saturations, blood pressure, adequacy of pulmonary  ?                       ventilation, and response to care were monitored  ?                       throughout the procedure. The physical status of the  ?                       patient was re-assessed after the procedure. ?                       After obtaining informed consent, the endoscope was  ?                       passed under direct vision. Throughout the procedure,  ?                       the patient's blood pressure, pulse, and oxygen  ?                       saturations were monitored continuously. The Endoscope  ?  was introduced through the mouth, and advanced to the  ?                       second part of duodenum. The upper GI endoscopy was  ?                       accomplished without difficulty. The patient tolerated  ?                       the procedure well. ?Findings: ?     The examined esophagus was normal. ?     The entire examined stomach was normal. ?     The examined duodenum was normal. ?Impression:            - Normal esophagus. ?                       - Normal stomach. ?                       - Normal examined duodenum. ?                       - No specimens collected. ?Recommendation:        - Perform a colonoscopy today. ?Procedure Code(s):      --- Professional --- ?                       [phone], Esophagogastroduodenoscopy, flexible,  ?                       transoral; diagnostic, including collection of  ?                       specimen(s) by brushing or washing, when performed  ?                       (separate procedure) ?Diagnosis Code(s):     --- Professional --- ?                       D50.9, Iron deficiency anemia, unspecified ?                       K21.9, Gastro-esophageal reflux disease without  ?                       esophagitis ?CPT copyright 2019 American Medical Association. All rights reserved. ?The codes documented in this report are preliminary and upon coder review may  ?be revised to meet current compliance requirements. ?Andrey Farmer MD, MD ?05/03/2021 8:22:22 AM ?Number of Addenda: 0 ?Note Initiated On: 05/03/2021 7:38 AM ?Estimated Blood Loss:  Estimated blood loss: none. ?     [facility] ?

## 2021-05-03 NOTE — Anesthesia Postprocedure Evaluation (Signed)
Anesthesia Post Note ? ?Patient: Prentiss Polio ? ?Procedure(s) Performed: ESOPHAGOGASTRODUODENOSCOPY (EGD) WITH PROPOFOL ?COLONOSCOPY WITH PROPOFOL ? ?Patient location during evaluation: PACU ?Anesthesia Type: General ?Level of consciousness: awake and alert, oriented and patient cooperative ?Pain management: pain level controlled ?Vital Signs Assessment: post-procedure vital signs reviewed and stable ?Respiratory status: spontaneous breathing, nonlabored ventilation and respiratory function stable ?Cardiovascular status: blood pressure returned to baseline and stable ?Postop Assessment: adequate PO intake ?Anesthetic complications: no ? ? ?No notable events documented. ? ? ?Last Vitals:  ?Vitals:  ? 05/03/21 0850 05/03/21 0900  ?BP:  130/69  ?Pulse: 84 77  ?Resp: 18 17  ?Temp:    ?SpO2: 97% 100%  ?  ?Last Pain:  ?Vitals:  ? 05/03/21 0824  ?TempSrc: Temporal  ?PainSc:   ? ? ?  ?  ?  ?  ?  ?  ? ?Darrin Nipper ? ? ? ? ?

## 2021-05-03 NOTE — Op Note (Signed)
[facility] ?Gastroenterology ?Patient Name: Douglas Mercer ?Procedure Date: 05/03/2021 7:37 AM ?MRN: [id number] ?Account #: [id number] ?Date of Birth: 1945/04/24 ?Admit Type: Outpatient ?Age: 76 ?Room: [facility] ENDO ROOM 1 ?Gender: Male ?Note Status: Finalized ?Instrument Name: Colonoscope [id number] ?Procedure:             Colonoscopy ?Indications:           Surveillance: Personal history of adenomatous polyps  ?                       on last colonoscopy > 3 years ago ?Providers:             Andrey Farmer MD, MD ?Referring MD:          Adrian Prows (Referring MD) ?Medicines:             Monitored Anesthesia Care ?Complications:         No immediate complications. Estimated blood loss:  ?                       Minimal. ?Procedure:             Pre-Anesthesia Assessment: ?                       - Prior to the procedure, a History and Physical was  ?                       performed, and patient medications and allergies were  ?                       reviewed. The patient is competent. The risks and  ?                       benefits of the procedure and the sedation options and  ?                       risks were discussed with the patient. All questions  ?                       were answered and informed consent was obtained.  ?                       Patient identification and proposed procedure were  ?                       verified by the physician, the nurse, the  ?                       anesthesiologist, the anesthetist and the technician  ?                       in the endoscopy suite. Mental Status Examination:  ?                       alert and oriented. Airway Examination: normal  ?                       oropharyngeal airway and neck mobility. Respiratory  ?  Examination: clear to auscultation. CV Examination:  ?                       normal. Prophylactic Antibiotics: The patient does not  ?                       require prophylactic antibiotics. Prior  ?                        Anticoagulants: The patient has taken Xarelto  ?                       (rivaroxaban), last dose was 5 days prior to  ?                       procedure. ASA Grade Assessment: II - A patient with  ?                       mild systemic disease. After reviewing the risks and  ?                       benefits, the patient was deemed in satisfactory  ?                       condition to undergo the procedure. The anesthesia  ?                       plan was to use monitored anesthesia care (MAC).  ?                       Immediately prior to administration of medications,  ?                       the patient was re-assessed for adequacy to receive  ?                       sedatives. The heart rate, respiratory rate, oxygen  ?                       saturations, blood pressure, adequacy of pulmonary  ?                       ventilation, and response to care were monitored  ?                       throughout the procedure. The physical status of the  ?                       patient was re-assessed after the procedure. ?                       After obtaining informed consent, the colonoscope was  ?                       passed under direct vision. Throughout the procedure,  ?                       the patient's blood pressure, pulse, and oxygen  ?  saturations were monitored continuously. The  ?                       Colonoscope was introduced through the anus and  ?                       advanced to the the cecum, identified by appendiceal  ?                       orifice and ileocecal valve. The colonoscopy was  ?                       somewhat difficult due to a tortuous colon. The  ?                       patient tolerated the procedure well. The quality of  ?                       the bowel preparation was good. ?Findings: ?     The perianal and digital rectal examinations were normal. ?     A localized area of mildly hemorrhagic mucosa was found in the cecum.  ?     Biopsies were taken with a  cold forceps for histology. Estimated blood  ?     loss was minimal. ?     A few small-mouthed diverticula were found in the sigmoid colon. ?     Internal hemorrhoids were found during retroflexion. The hemorrhoids  ?     were Grade I (internal hemorrhoids that do not prolapse). ?     The exam was otherwise without abnormality on direct and retroflexion  ?     views. ?Impression:            - Hemorrhagic mucosa in the cecum. Biopsied. ?                       - Diverticulosis in the sigmoid colon. ?                       - Internal hemorrhoids. ?                       - The examination was otherwise normal on direct and  ?                       retroflexion views. ?Recommendation:        - Discharge patient to home. ?                       - Resume previous diet. ?                       - Continue present medications. ?                       - Await pathology results. ?                       - Repeat colonoscopy is not recommended due to current  ?                       age (79 years  or older) for surveillance. ?                       - Resume Xarelto (rivaroxaban) at prior dose tomorrow. ?                       - Return to referring physician as previously  ?                       scheduled. ?Procedure Code(s):     --- Professional --- ?                       [id number], Colonoscopy, flexible; with biopsy, single or  ?                       multiple ?Diagnosis Code(s):     --- Professional --- ?                       Z86.010, Personal history of colonic polyps ?                       K92.2, Gastrointestinal hemorrhage, unspecified ?                       K64.0, First degree hemorrhoids ?                       K57.30, Diverticulosis of large intestine without  ?                       perforation or abscess without bleeding ?CPT copyright 2019 [employer] Association. All rights reserved. ?The codes documented in this report are preliminary and upon coder review may  ?be revised to meet current compliance  requirements. ?Andrey Farmer MD, MD ?05/03/2021 8:26:25 AM ?Number of Addenda: 0 ?Note Initiated On: 05/03/2021 7:37 AM ?Scope Withdrawal Time: 0 hours 7 minutes 50 seconds  ?Total Procedure Duration: 0 hours 19 minutes 54 seconds  ?Estimated Blood Loss:  Estimated blood loss was minimal. ?     [facility] ?

## 2021-05-03 NOTE — Interval H&P Note (Signed)
History and Physical Interval Note: ? ?05/03/2021 ?7:47 AM ? ?Douglas Mercer  has presented today for surgery, with the diagnosis of Weigth Loss ?Anemia ?HO Adenomatous Polyps.  The various methods of treatment have been discussed with the patient and family. After consideration of risks, benefits and other options for treatment, the patient has consented to  Procedure(s) with comments: ?ESOPHAGOGASTRODUODENOSCOPY (EGD) WITH PROPOFOL (N/A) - DM ?COLONOSCOPY WITH PROPOFOL (N/A) as a surgical intervention.  The patient's history has been reviewed, patient examined, no change in status, stable for surgery.  I have reviewed the patient's chart and labs.  Questions were answered to the patient's satisfaction.   ? ? ?[name] ? ?Ok to proceed with EGD/Colonoscopy ?

## 2021-05-03 NOTE — Anesthesia Preprocedure Evaluation (Addendum)
Anesthesia Evaluation  ?Patient identified by MRN, date of birth, ID band ?Patient awake ? ? ? ?Reviewed: ?Allergy & Precautions, NPO status , Patient's Chart, lab work & pertinent test results ? ?History of Anesthesia Complications ?Negative for: history of anesthetic complications ? ?Airway ?Mallampati: I ? ? ?Neck ROM: Full ? ? ? Dental ? ?(+) Poor Dentition ?Several missing and chipped teeth:   ?Pulmonary ?neg pulmonary ROS,  ?  ?Pulmonary exam normal ?breath sounds clear to auscultation ? ? ? ? ? ? Cardiovascular ?hypertension, Normal cardiovascular exam ?Rhythm:Regular Rate:Normal ? ?ECG 12/29/20: SR with PVCs ? ?Myocardial perfusion 09/24/15:  ?- There was no ST segment deviation noted during stress. ?- No T wave inversion was noted during stress. ?- The study is normal. ?- This is a low risk study. ?- Calculated EF was 41% but visually appears normal. Correlate with echo. ?  ?Neuro/Psych ?Seizures - (last sz 2019), Well Controlled,    ? GI/Hepatic ?GERD  ,  ?Endo/Other  ?diabetes, Type 2 ? Renal/GU ?negative Renal ROS  ? ?BPH ? ?  ?Musculoskeletal ? ? Abdominal ?  ?Peds ? Hematology ? ?(+) Blood dyscrasia (Anti-Pl Ab syndrome; hx PE and DVT on Xarelto), anemia ,   ?Anesthesia Other Findings ? ? Reproductive/Obstetrics ? ?  ? ? ? ? ? ? ? ? ? ? ? ? ? ?  ?  ? ? ? ? ? ? ? ?Anesthesia Physical ?Anesthesia Plan ? ?ASA: 3 ? ?Anesthesia Plan: General  ? ?Post-op Pain Management:   ? ?Induction: Intravenous ? ?PONV Risk Score and Plan: 2 and Propofol infusion, TIVA and Treatment may vary due to age or medical condition ? ?Airway Management Planned: Natural Airway ? ?Additional Equipment:  ? ?Intra-op Plan:  ? ?Post-operative Plan:  ? ?Informed Consent: I have reviewed the patients History and Physical, chart, labs and discussed the procedure including the risks, benefits and alternatives for the proposed anesthesia with the patient or authorized representative who has indicated his/her  understanding and acceptance.  ? ? ? ? ? ?Plan Discussed with: CRNA ? ?Anesthesia Plan Comments: (LMA/GETA backup discussed.  Patient consented for risks of anesthesia including but not limited to:  ?- adverse reactions to medications ?- damage to eyes, teeth, lips or other oral mucosa ?- nerve damage due to positioning  ?- sore throat or hoarseness ?- damage to heart, brain, nerves, lungs, other parts of body or loss of life ? ?Informed patient about role of CRNA in peri- and intra-operative care.  Patient voiced understanding.)  ? ? ? ? ? ? ?Anesthesia Quick Evaluation ? ?

## 2021-05-03 NOTE — Transfer of Care (Signed)
Immediate Anesthesia Transfer of Care Note ? ?Patient: Douglas Mercer ? ?Procedure(s) Performed: ESOPHAGOGASTRODUODENOSCOPY (EGD) WITH PROPOFOL ?COLONOSCOPY WITH PROPOFOL ? ?Patient Location: PACU ? ?Anesthesia Type:General ? ?Level of Consciousness: awake ? ?Airway & Oxygen Therapy: Patient Spontanous Breathing and Patient connected to face mask ? ?Post-op Assessment: Report given to RN and Post -op Vital signs reviewed and stable ? ?Post vital signs: Reviewed and stable ? ?Last Vitals:  ?Vitals Value Taken Time  ?BP 107/57 05/03/21 0825  ?Temp 36.3 ?C 05/03/21 0824  ?Pulse 74 05/03/21 0825  ?Resp 12 05/03/21 0825  ?SpO2 100 % 05/03/21 0825  ? ? ?Last Pain:  ?Vitals:  ? 05/03/21 0824  ?TempSrc: Temporal  ?PainSc:   ?   ? ?  ? ?Complications: No notable events documented. ?

## 2021-05-04 ENCOUNTER — Encounter: Payer: Self-pay | Admitting: Gastroenterology

## 2021-05-04 LAB — SURGICAL PATHOLOGY

## 2021-06-01 ENCOUNTER — Inpatient Hospital Stay: Payer: Medicare HMO

## 2021-06-01 ENCOUNTER — Other Ambulatory Visit: Payer: Self-pay

## 2021-06-01 ENCOUNTER — Inpatient Hospital Stay
Admission: EM | Admit: 2021-06-01 | Discharge: 2021-06-03 | DRG: 871 | Disposition: A | Discharge status: Home / Self Care | Payer: Medicare HMO | Attending: Internal Medicine | Admitting: Internal Medicine

## 2021-06-01 ENCOUNTER — Emergency Department: Payer: Medicare HMO

## 2021-06-01 DIAGNOSIS — A419 Sepsis, unspecified organism: Principal | ICD-10-CM | POA: Diagnosis present

## 2021-06-01 DIAGNOSIS — Z833 Family history of diabetes mellitus: Secondary | ICD-10-CM

## 2021-06-01 DIAGNOSIS — Z8249 Family history of ischemic heart disease and other diseases of the circulatory system: Secondary | ICD-10-CM | POA: Diagnosis not present

## 2021-06-01 DIAGNOSIS — Z79899 Other long term (current) drug therapy: Secondary | ICD-10-CM | POA: Diagnosis not present

## 2021-06-01 DIAGNOSIS — Z86711 Personal history of pulmonary embolism: Secondary | ICD-10-CM

## 2021-06-01 DIAGNOSIS — G40909 Epilepsy, unspecified, not intractable, without status epilepticus: Secondary | ICD-10-CM | POA: Diagnosis present

## 2021-06-01 DIAGNOSIS — G40209 Localization-related (focal) (partial) symptomatic epilepsy and epileptic syndromes with complex partial seizures, not intractable, without status epilepticus: Secondary | ICD-10-CM | POA: Diagnosis present

## 2021-06-01 DIAGNOSIS — Z20822 Contact with and (suspected) exposure to covid-19: Secondary | ICD-10-CM | POA: Diagnosis present

## 2021-06-01 DIAGNOSIS — Z825 Family history of asthma and other chronic lower respiratory diseases: Secondary | ICD-10-CM

## 2021-06-01 DIAGNOSIS — E1165 Type 2 diabetes mellitus with hyperglycemia: Secondary | ICD-10-CM | POA: Diagnosis present

## 2021-06-01 DIAGNOSIS — Z86718 Personal history of other venous thrombosis and embolism: Secondary | ICD-10-CM | POA: Diagnosis not present

## 2021-06-01 DIAGNOSIS — E872 Acidosis, unspecified: Secondary | ICD-10-CM | POA: Diagnosis present

## 2021-06-01 DIAGNOSIS — E041 Nontoxic single thyroid nodule: Secondary | ICD-10-CM | POA: Diagnosis present

## 2021-06-01 DIAGNOSIS — N400 Enlarged prostate without lower urinary tract symptoms: Secondary | ICD-10-CM | POA: Diagnosis present

## 2021-06-01 DIAGNOSIS — Z981 Arthrodesis status: Secondary | ICD-10-CM | POA: Diagnosis not present

## 2021-06-01 DIAGNOSIS — E785 Hyperlipidemia, unspecified: Secondary | ICD-10-CM | POA: Diagnosis present

## 2021-06-01 DIAGNOSIS — Z7982 Long term (current) use of aspirin: Secondary | ICD-10-CM

## 2021-06-01 DIAGNOSIS — I10 Essential (primary) hypertension: Secondary | ICD-10-CM | POA: Diagnosis present

## 2021-06-01 DIAGNOSIS — K219 Gastro-esophageal reflux disease without esophagitis: Secondary | ICD-10-CM | POA: Diagnosis present

## 2021-06-01 DIAGNOSIS — H409 Unspecified glaucoma: Secondary | ICD-10-CM | POA: Diagnosis present

## 2021-06-01 DIAGNOSIS — D6861 Antiphospholipid syndrome: Secondary | ICD-10-CM | POA: Diagnosis present

## 2021-06-01 DIAGNOSIS — J189 Pneumonia, unspecified organism: Secondary | ICD-10-CM | POA: Diagnosis not present

## 2021-06-01 DIAGNOSIS — Z7901 Long term (current) use of anticoagulants: Secondary | ICD-10-CM | POA: Diagnosis not present

## 2021-06-01 DIAGNOSIS — Z7984 Long term (current) use of oral hypoglycemic drugs: Secondary | ICD-10-CM

## 2021-06-01 LAB — URINALYSIS, COMPLETE (UACMP) WITH MICROSCOPIC
Bacteria, UA: NONE SEEN
Bilirubin Urine: NEGATIVE
Glucose, UA: >=500 mg/dL — AB
Ketones, ur: NEGATIVE mg/dL
Leukocytes,Ua: NEGATIVE
Nitrite: NEGATIVE
Protein, ur: NEGATIVE mg/dL
Specific Gravity, Urine: 1.022 (ref 1.005–1.030)
Squamous Epithelial / HPF: NONE SEEN (ref 0–5)
pH: 5 (ref 5.0–8.0)

## 2021-06-01 LAB — RESP PANEL BY RT-PCR (FLU A&B, COVID) ARPGX2
Influenza A by PCR: NEGATIVE
Influenza B by PCR: NEGATIVE
SARS Coronavirus 2 by RT PCR: NEGATIVE

## 2021-06-01 LAB — COMPREHENSIVE METABOLIC PANEL
ALT: 13 U/L (ref 0–44)
AST: 18 U/L (ref 15–41)
Albumin: 3.7 g/dL (ref 3.5–5.0)
Alkaline Phosphatase: 73 U/L (ref 38–126)
Anion gap: 6 (ref 5–15)
BUN: 19 mg/dL (ref 8–23)
CO2: 28 mmol/L (ref 22–32)
Calcium: 8.9 mg/dL (ref 8.9–10.3)
Chloride: 99 mmol/L (ref 98–111)
Creatinine, Ser: 0.87 mg/dL (ref 0.61–1.24)
GFR, Estimated: >60 mL/min (ref >60)
Glucose, Bld: 334 mg/dL — ABNORMAL HIGH (ref 70–99)
Potassium: 4.3 mmol/L (ref 3.5–5.1)
Sodium: 133 mmol/L — ABNORMAL LOW (ref 135–145)
Total Bilirubin: 1.3 mg/dL — ABNORMAL HIGH (ref 0.3–1.2)
Total Protein: 7.1 g/dL (ref 6.5–8.1)

## 2021-06-01 LAB — APTT: aPTT: 85 seconds — ABNORMAL HIGH (ref 24–36)

## 2021-06-01 LAB — CBC WITH DIFFERENTIAL/PLATELET
Abs Immature Granulocytes: 0.03 10*3/uL (ref 0.00–0.07)
Basophils Absolute: 0 10*3/uL (ref 0.0–0.1)
Basophils Relative: 0 %
Eosinophils Absolute: 0 10*3/uL (ref 0.0–0.5)
Eosinophils Relative: 0 %
HCT: 41.9 % (ref 39.0–52.0)
Hemoglobin: 13.4 g/dL (ref 13.0–17.0)
Immature Granulocytes: 0 %
Lymphocytes Relative: 3 %
Lymphs Abs: 0.4 10*3/uL — ABNORMAL LOW (ref 0.7–4.0)
MCH: 29.6 pg (ref 26.0–34.0)
MCHC: 32 g/dL (ref 30.0–36.0)
MCV: 92.7 fL (ref 80.0–100.0)
Monocytes Absolute: 0.4 10*3/uL (ref 0.1–1.0)
Monocytes Relative: 3 %
Neutro Abs: 12 10*3/uL — ABNORMAL HIGH (ref 1.7–7.7)
Neutrophils Relative %: 94 %
Platelets: 206 10*3/uL (ref 150–400)
RBC: 4.52 MIL/uL (ref 4.22–5.81)
RDW: 13.3 % (ref 11.5–15.5)
WBC: 12.8 10*3/uL — ABNORMAL HIGH (ref 4.0–10.5)
nRBC: 0 % (ref 0.0–0.2)

## 2021-06-01 LAB — LACTIC ACID, PLASMA
Lactic Acid, Venous: 3.6 mmol/L (ref 0.5–1.9)
Lactic Acid, Venous: 2.2 mmol/L (ref 0.5–1.9)

## 2021-06-01 LAB — PROTIME-INR
INR: 2.5 — ABNORMAL HIGH (ref 0.8–1.2)
Prothrombin Time: 26.7 seconds — ABNORMAL HIGH (ref 11.4–15.2)

## 2021-06-01 LAB — GLUCOSE, CAPILLARY: Glucose-Capillary: 210 mg/dL — ABNORMAL HIGH (ref 70–99)

## 2021-06-01 LAB — BRAIN NATRIURETIC PEPTIDE: B Natriuretic Peptide: 29.4 pg/mL (ref 0.0–100.0)

## 2021-06-01 LAB — PROCALCITONIN: Procalcitonin: 0.3 ng/mL

## 2021-06-01 LAB — CBG MONITORING, ED: Glucose-Capillary: 353 mg/dL — ABNORMAL HIGH (ref 70–99)

## 2021-06-01 LAB — TROPONIN I (HIGH SENSITIVITY): Troponin I (High Sensitivity): 8 ng/L (ref <18)

## 2021-06-01 IMAGING — CT CT ANGIO CHEST
2 of 7 series · 17 of 46 positions shown · IV contrast (APPLIED)
Comparison: Chest radiograph earlier today.  Chest CT [DATE]

CLINICAL DATA: Pulmonary embolism (PE) suspected, unknown D-dimer
also likely pneumonia

EXAM:
CT ANGIOGRAPHY CHEST WITH CONTRAST
TECHNIQUE: Multidetector CT imaging of the chest was performed using the
standard protocol during bolus administration of intravenous
contrast. Multiplanar CT image reconstructions and MIPs were
obtained to evaluate the vascular anatomy.

[Series 8: thins · axial · 0.75mm/px · z∈[+1191,+1446]mm · 14 of 405 slices shown]
[im 23/405  lung]
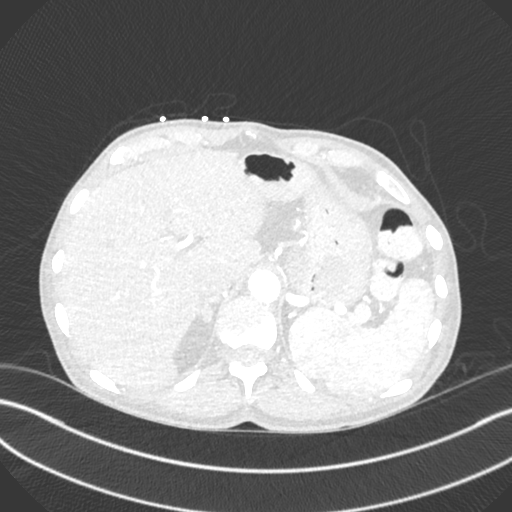
[im 45/405  soft-tissue]
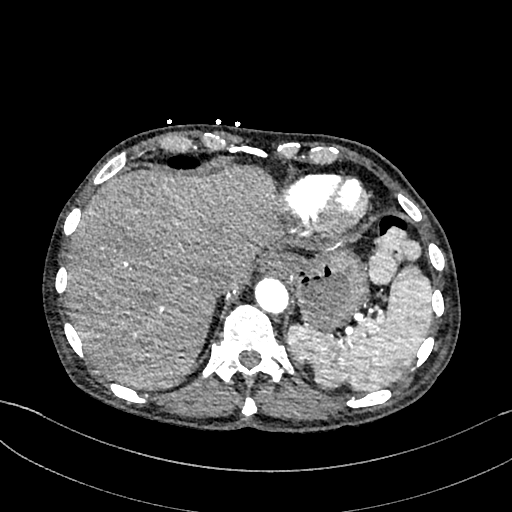
[im 90/405  lung]
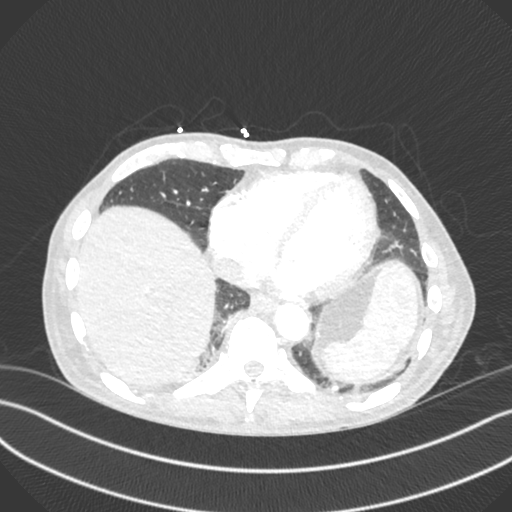
[im 113/405  soft-tissue]
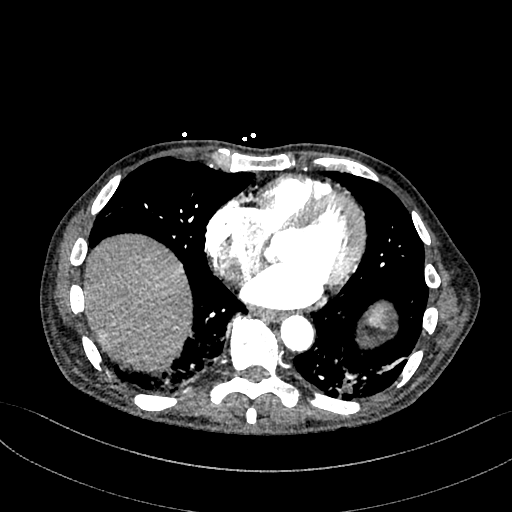
[im 135/405  lung]
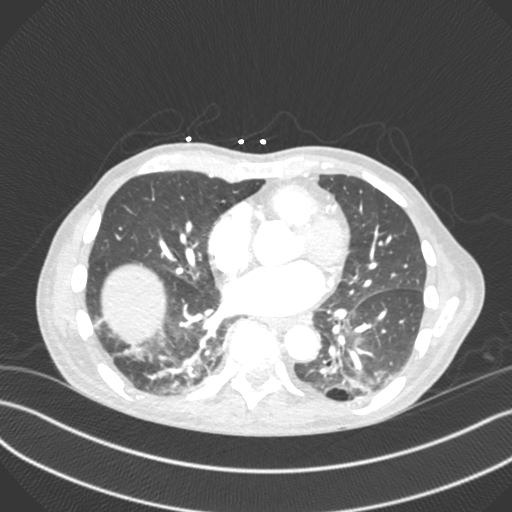
[im 158/405  soft-tissue]
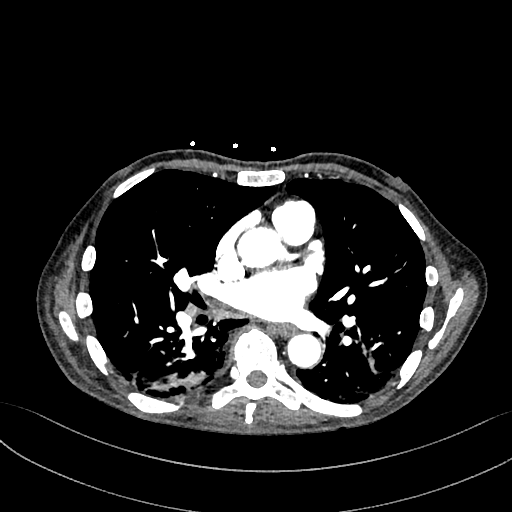
[im 180/405  lung]
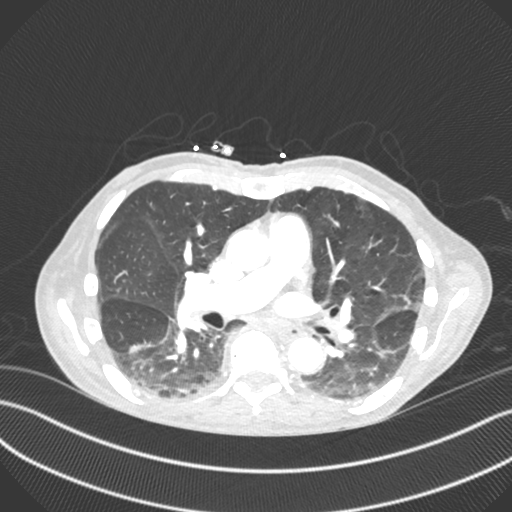
[im 225/405  soft-tissue]
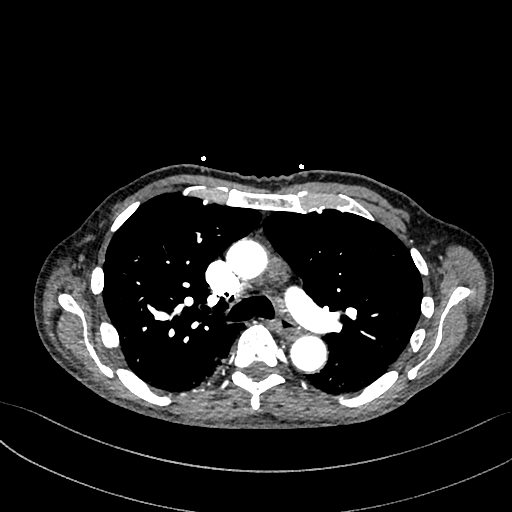
[im 247/405  lung]
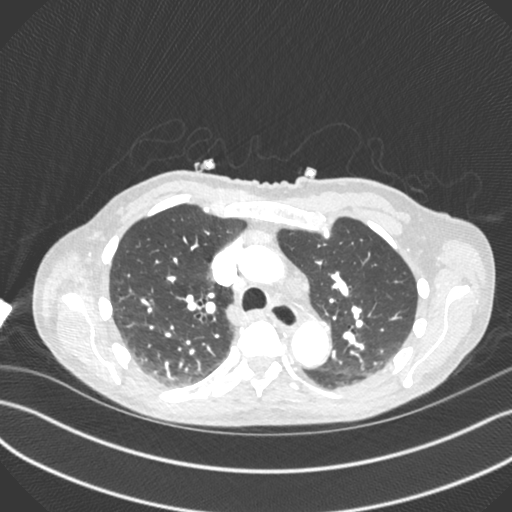
[im 270/405  soft-tissue]
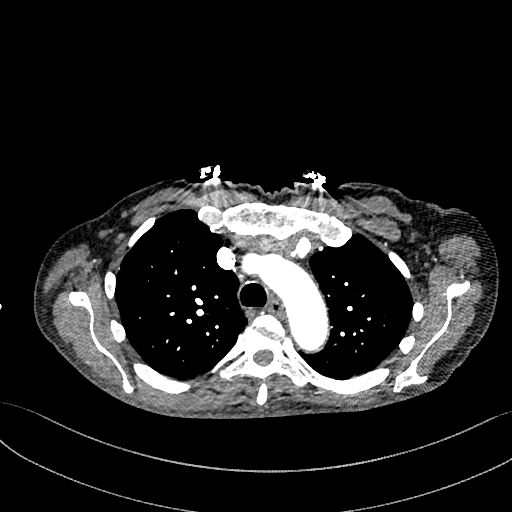
[im 292/405  lung]
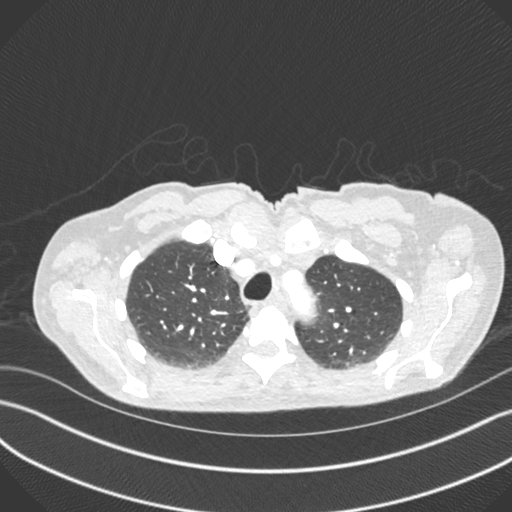
[im 315/405  soft-tissue]
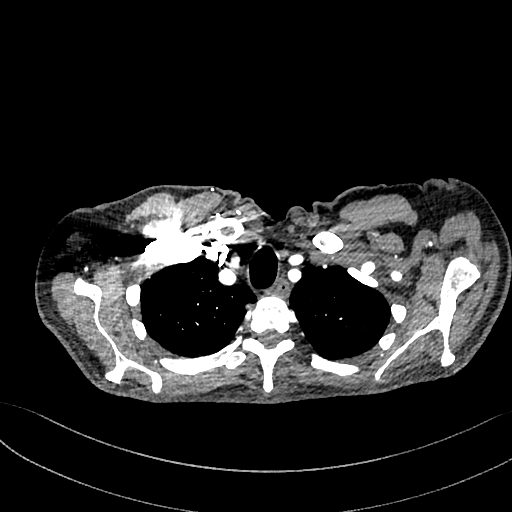
[im 360/405  lung]
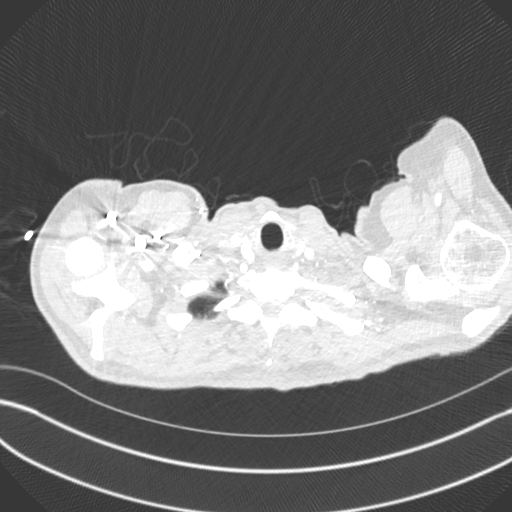
[im 382/405  soft-tissue]
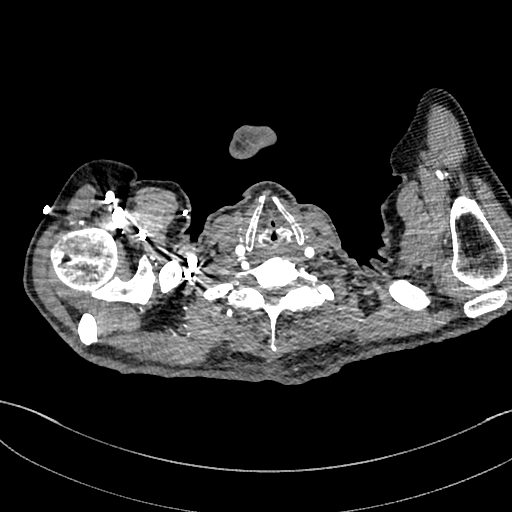

[Series 9: cor · coronal · 0.57mm/px · 3 of 144 slices shown]
[im 36/144  soft-tissue]
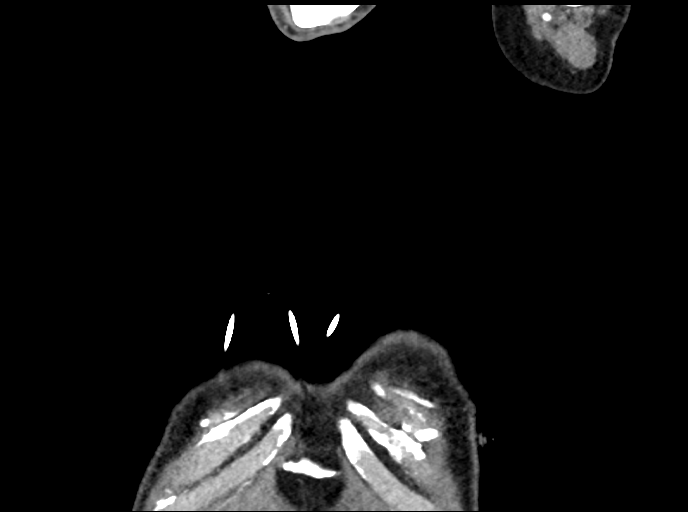
[im 72/144  soft-tissue]
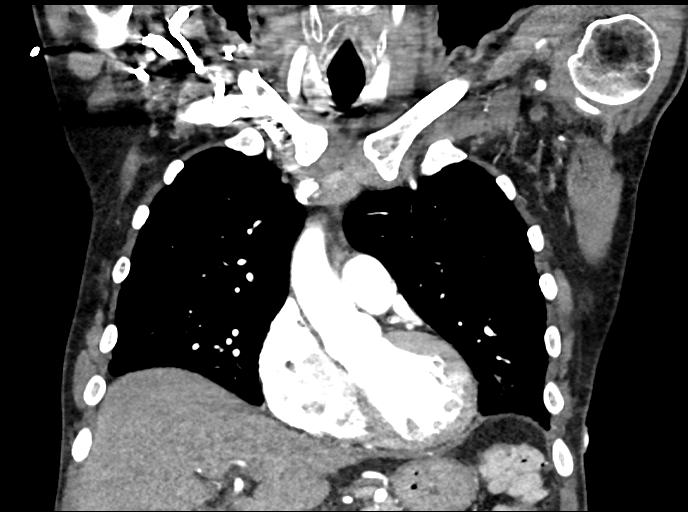
[im 108/144  soft-tissue]
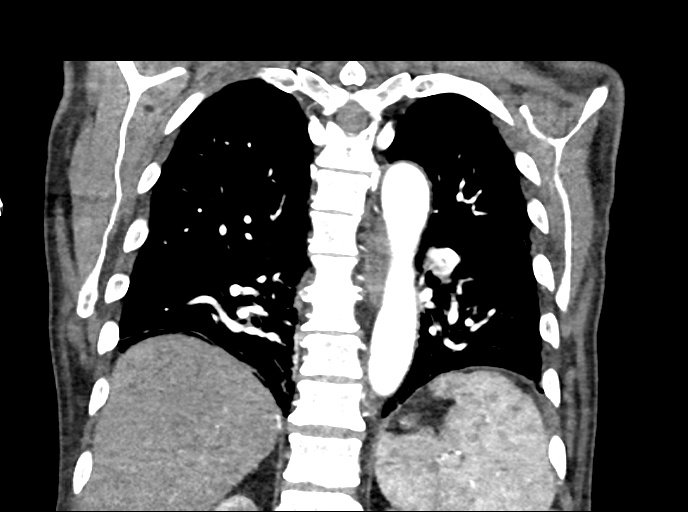

[17 of 46 positions shown; findings below may reference images not displayed]

RADIATION DOSE REDUCTION: This exam was performed according to the
departmental dose-optimization program which includes automated
exposure control, adjustment of the mA and/or kV according to
patient size and/or use of iterative reconstruction technique.

CONTRAST:  75mL OMNIPAQUE IOHEXOL 350 MG/ML SOLN
FINDINGS: Cardiovascular: There are no filling defects within the pulmonary
arteries to suggest pulmonary embolus. No aortic dissection,
aneurysm, or acute aortic findings. Conventional branching pattern
from the aortic arch, aortic branch vessels are patent. Heart is
normal in size. No pericardial effusion.

Mediastinum/Nodes: Small mediastinal and hilar lymph nodes are all
subcentimeter short axis and not enlarged by size criteria. There is
a 15 mm hypodense right thyroid nodule. No esophageal wall
thickening.

Lungs/Pleura: Ill-defined linear and patchy opacities in the
dependent lower lobes, some of which was present on prior exam and
represent underlying scarring. Left upper lobe perifissural scarring
is unchanged. Stable right lower lobe 5 mm nodule series 7, image
98, stable from [S7] and considered benign. No new pulmonary
nodules. No pleural effusion. No features of pulmonary edema.

Upper Abdomen: No acute or unexpected findings.

Musculoskeletal: Thoracic spondylosis. There are no acute or
suspicious osseous abnormalities. No chest wall soft tissue
abnormalities

Review of the MIP images confirms the above findings.
IMPRESSION: 1. No pulmonary embolus.
2. Ill-defined linear and patchy opacities in the dependent lower
lobes. This likely represents atelectasis superimposed on scarring.
The possibility of pneumonia is not entirely excluded but felt less
likely.
3. Right thyroid nodule measuring 15 mm. Recommend thyroid US (ref:
[HOSPITAL]. [DATE]): 143-50).

## 2021-06-01 MED ORDER — PANTOPRAZOLE SODIUM 40 MG PO TBEC
40.0000 mg | DELAYED_RELEASE_TABLET | Freq: Every day | ORAL | Status: DC
Start: 2021-06-02 — End: 2021-06-03
  Administered 2021-06-02 – 2021-06-03 (×2): 40 mg via ORAL
  Filled 2021-06-01 (×2): qty 1

## 2021-06-01 MED ORDER — LACTATED RINGERS IV BOLUS
1000.0000 mL | Freq: Once | INTRAVENOUS | Status: AC
  Administered 2021-06-01: 1000 mL via INTRAVENOUS

## 2021-06-01 MED ORDER — RIVAROXABAN 20 MG PO TABS
20.0000 mg | ORAL_TABLET | Freq: Every day | ORAL | Status: DC
  Administered 2021-06-02: 20 mg via ORAL
  Filled 2021-06-01: qty 1

## 2021-06-01 MED ORDER — SODIUM CHLORIDE 0.9 % IV SOLN
500.0000 mg | INTRAVENOUS | Status: DC
  Administered 2021-06-01 – 2021-06-02 (×2): 500 mg via INTRAVENOUS
  Filled 2021-06-01 (×2): qty 5
  Filled 2021-06-01: qty 500

## 2021-06-01 MED ORDER — FINASTERIDE 5 MG PO TABS
5.0000 mg | ORAL_TABLET | Freq: Every day | ORAL | Status: DC
  Administered 2021-06-02 – 2021-06-03 (×2): 5 mg via ORAL
  Filled 2021-06-01 (×2): qty 1

## 2021-06-01 MED ORDER — LACTATED RINGERS IV SOLN: INTRAVENOUS | Status: AC

## 2021-06-01 MED ORDER — ACETAMINOPHEN 500 MG PO TABS: 1000.0000 mg | ORAL_TABLET | Freq: Once | ORAL | Status: DC

## 2021-06-01 MED ORDER — ASPIRIN EC 81 MG PO TBEC
81.0000 mg | DELAYED_RELEASE_TABLET | Freq: Every day | ORAL | Status: DC
  Administered 2021-06-02 – 2021-06-03 (×2): 81 mg via ORAL
  Filled 2021-06-01 (×2): qty 1

## 2021-06-01 MED ORDER — LATANOPROST 0.005 % OP SOLN
1.0000 [drp] | Freq: Every day | OPHTHALMIC | Status: DC
  Administered 2021-06-01 – 2021-06-02 (×2): 1 [drp] via OPHTHALMIC
  Filled 2021-06-01 (×2): qty 2.5

## 2021-06-01 MED ORDER — SODIUM CHLORIDE 0.9 % IV SOLN: 2.0000 g | INTRAVENOUS | Status: DC

## 2021-06-01 MED ORDER — ONDANSETRON HCL 4 MG/2ML IJ SOLN
4.0000 mg | Freq: Four times a day (QID) | INTRAMUSCULAR | Status: DC | PRN
Start: 2021-06-01 — End: 2021-06-03

## 2021-06-01 MED ORDER — POLYVINYL ALCOHOL 1.4 % OP SOLN: 1.0000 [drp] | OPHTHALMIC | Status: DC | PRN

## 2021-06-01 MED ORDER — LACTATED RINGERS IV BOLUS
2000.0000 mL | Freq: Once | INTRAVENOUS | Status: AC
  Administered 2021-06-01: 2000 mL via INTRAVENOUS

## 2021-06-01 MED ORDER — VITAMIN B-12 1000 MCG PO TABS
1000.0000 ug | ORAL_TABLET | Freq: Every day | ORAL | Status: DC
  Administered 2021-06-02 – 2021-06-03 (×2): 1000 ug via ORAL
  Filled 2021-06-01 (×2): qty 1

## 2021-06-01 MED ORDER — SODIUM CHLORIDE 0.9 % IV SOLN: 500.0000 mg | INTRAVENOUS | Status: DC

## 2021-06-01 MED ORDER — ONDANSETRON HCL 4 MG PO TABS: 4.0000 mg | ORAL_TABLET | Freq: Four times a day (QID) | ORAL | Status: DC | PRN

## 2021-06-01 MED ORDER — INSULIN ASPART 100 UNIT/ML IJ SOLN
0.0000 [IU] | Freq: Three times a day (TID) | INTRAMUSCULAR | Status: DC
  Administered 2021-06-02: 2 [IU] via SUBCUTANEOUS
  Administered 2021-06-02: 5 [IU] via SUBCUTANEOUS
  Administered 2021-06-03: 3 [IU] via SUBCUTANEOUS
  Filled 2021-06-01 (×3): qty 1

## 2021-06-01 MED ORDER — INSULIN ASPART 100 UNIT/ML IJ SOLN: 0.0000 [IU] | INTRAMUSCULAR | Status: DC

## 2021-06-01 MED ORDER — SODIUM CHLORIDE 0.9 % IV SOLN
2.0000 g | INTRAVENOUS | Status: DC
  Administered 2021-06-01 – 2021-06-02 (×2): 2 g via INTRAVENOUS
  Filled 2021-06-01: qty 2
  Filled 2021-06-01 (×2): qty 20

## 2021-06-01 MED ORDER — ACETAMINOPHEN 650 MG RE SUPP: 650.0000 mg | Freq: Four times a day (QID) | RECTAL | Status: DC | PRN

## 2021-06-01 MED ORDER — ACETAMINOPHEN 325 MG PO TABS: 650.0000 mg | ORAL_TABLET | Freq: Four times a day (QID) | ORAL | Status: DC | PRN

## 2021-06-01 MED ORDER — IOHEXOL 350 MG/ML SOLN
75.0000 mL | Freq: Once | INTRAVENOUS | Status: AC | PRN
  Administered 2021-06-01: 75 mL via INTRAVENOUS

## 2021-06-01 MED ORDER — LEVETIRACETAM 500 MG PO TABS
1000.0000 mg | ORAL_TABLET | Freq: Two times a day (BID) | ORAL | Status: DC
  Administered 2021-06-01 – 2021-06-03 (×4): 1000 mg via ORAL
  Filled 2021-06-01 (×4): qty 2

## 2021-06-01 MED ORDER — ATORVASTATIN CALCIUM 20 MG PO TABS
10.0000 mg | ORAL_TABLET | Freq: Every day | ORAL | Status: DC
  Administered 2021-06-02: 10 mg via ORAL
  Filled 2021-06-01: qty 1

## 2021-06-01 NOTE — Consult Note (Signed)
CODE SEPSIS - PHARMACY COMMUNICATION ? ?**Broad Spectrum Antibiotics should be administered within 1 hour of Sepsis diagnosis** ? ?Time Code Sepsis Called/Page Received: 1030 ? ?Antibiotics Ordered: ceftriaxone/Azithromycin ? ?Time of 1st antibiotic administration: 1526 ? ? ? ? ?Dorothe Pea, PharmD, BCPS ?Clinical Pharmacist   ?06/01/2021  3:05 PM ? ?

## 2021-06-01 NOTE — ED Triage Notes (Signed)
Pt comes pov with weakness, shob, fevers for about a week. Thought he was getting better then got worse over the past 2 days. Hx of pneumonia.  ?

## 2021-06-01 NOTE — ED Provider Notes (Signed)
? ?[facility] ?Provider Note ? ? ? None  ?  (approximate) ? ? ?History  ? ?Weakness ? ? ?HPI ? ?Douglas Mercer is a 75 y.o. male .  With a past medical history of HTN, HDL, GERD, epilepsy, DVT and PE as well as antiphospholipid syndrome on Xarelto, DM, and BPH who presents for evaluation accompanied by spouse for 10 days of worsening cough associate with fever.  Patient has also been feeling congested.  His wife felt that he was slightly confused today but he denies this and is oriented x4 and she feels that maybe he was just a little bit tired.  He denies any pain including chest pain, abdominal pain, headache, earache, sore throat, back pain rash or extremity pain.  He denies any nausea, vomiting, diarrhea rash or any other acute concerns.  States he has been taking his Xarelto.  He denies any history of tobacco use, EtOH use or illicit drug use.  Denies any other acute concerns at this time. ? ? ?Past Medical History:  ?Diagnosis Date  ? Allergic rhinitis   ? Anti-phospholipid antibody syndrome (HCC)   ? BPH (benign prostatic hyperplasia)   ? BPH (benign prostatic hypertrophy)   ? Deficiency of clotting factor ([place])   ? Diabetes mellitus without complication ([place])   ? DVT (deep venous thrombosis) ([place]) 02/2009  ? large left lower  ? ED (erectile dysfunction)   ? Epilepsy ([place])   ? last seizure 09/27/2017  ? GERD (gastroesophageal reflux disease)   ? Glaucoma   ? Hyperlipidemia   ? Hypertension   ? Internal hemorrhoids   ? Low HDL (under 40)   ? Pneumonia   ? Pulmonary embolism ([place]) 1992  ? bilateral w/ PE reportedly (+) lupus anticoagulant  ? Rosacea   ? Tubular adenoma of colon   ? ? ? ?  ? ? ?Physical Exam  ?Triage Vital Signs: ?ED Triage Vitals  ?Enc Vitals Group  ?   BP 06/01/21 1444 (!) 144/100  ?   Pulse Rate 06/01/21 1442 (!) 132  ?   Resp 06/01/21 1442 (!) 25  ?   Temp 06/01/21 1442 (!) 101.3 ?F (38.5 ?C)  ?   Temp Source 06/01/21 1442 Oral  ?   SpO2 06/01/21 1444 99 %  ?   Weight  --   ?   Height --   ?   Head Circumference --   ?   Peak Flow --   ?   Pain Score 06/01/21 1442 0  ?   Pain Loc --   ?   Pain Edu? --   ?   Excl. in [place]? --   ? ? ?Most recent vital signs: ?Vitals:  ? 06/01/21 1442 06/01/21 1444  ?BP:  (!) 144/100  ?Pulse: (!) 132   ?Resp: (!) 25   ?Temp: (!) 101.3 ?F (38.5 ?C)   ?SpO2:  99%  ? ? ?General: Awake, no distress.  ?CV:  Good peripheral perfusion.  2+ radial pulse.  Tachycardic. ?Resp:  Normal effort.  Clear bilaterally.  Slightly tachypneic. ?Abd:  No distention.  Soft. ?Other:   ? ? ?ED Results / Procedures / Treatments  ?Labs ?(all labs ordered are listed, but only abnormal results are displayed) ?Labs Reviewed  ?COMPREHENSIVE METABOLIC PANEL - Abnormal; Notable for the following components:  ?    Result Value  ? Sodium 133 (*)   ? Glucose, Bld 334 (*)   ? Total Bilirubin 1.3 (*)   ?  All other components within normal limits  ?CBC WITH DIFFERENTIAL/PLATELET - Abnormal; Notable for the following components:  ? WBC 12.8 (*)   ? Neutro Abs 12.0 (*)   ? Lymphs Abs 0.4 (*)   ? All other components within normal limits  ?PROTIME-INR - Abnormal; Notable for the following components:  ? Prothrombin Time 26.7 (*)   ? INR 2.5 (*)   ? All other components within normal limits  ?APTT - Abnormal; Notable for the following components:  ? aPTT 85 (*)   ? All other components within normal limits  ?URINALYSIS, COMPLETE (UACMP) WITH MICROSCOPIC - Abnormal; Notable for the following components:  ? Color, Urine YELLOW (*)   ? APPearance CLEAR (*)   ? Glucose, UA >=500 (*)   ? Hgb urine dipstick SMALL (*)   ? All other components within normal limits  ?BLOOD GAS, VENOUS - Abnormal; Notable for the following components:  ? Bicarbonate 30.1 (*)   ? Acid-Base Excess 3.6 (*)   ? All other components within normal limits  ?CULTURE, BLOOD (ROUTINE X 2)  ?CULTURE, BLOOD (ROUTINE X 2)  ?URINE CULTURE  ?RESP PANEL BY RT-PCR (FLU A&B, COVID) ARPGX2  ?LACTIC ACID, PLASMA  ?LACTIC ACID, PLASMA   ?BRAIN NATRIURETIC PEPTIDE  ?PROCALCITONIN  ?HEMOGLOBIN A1C  ?TROPONIN I (HIGH SENSITIVITY)  ? ? ? ?EKG ? ?ECG is remarkable sinus tachycardia with a ventricular rate of 136, left axis deviation, right bundle branch block with fairly diffuse nonspecific ST changes throughout. ? ? ?RADIOLOGY ? ?Chest x-ray reviewed myself shows some increased haziness just inferior to the right hilum concerning for opacification.  No pneumothorax, large effusion or overt edema.  I also reviewed radiologist interpretation although does agree to findings if this is a completely negative x-ray. ? ? ?PROCEDURES: ? ?Critical Care performed: Yes, see critical care procedure note(s) ? ?.Critical Care ?Performed by: Lucrezia Starch, MD ?Authorized by: Lucrezia Starch, MD  ? ?Critical care provider statement:  ?  Critical care time (minutes):  30 ?  Critical care was necessary to treat or prevent imminent or life-threatening deterioration of the following conditions:  Sepsis ?  Critical care was time spent personally by me on the following activities:  Development of treatment plan with patient or surrogate, discussions with consultants, evaluation of patient's response to treatment, examination of patient, ordering and review of laboratory studies, ordering and review of radiographic studies, ordering and performing treatments and interventions, pulse oximetry, re-evaluation of patient's condition and review of old charts ? ? ? ?MEDICATIONS ORDERED IN ED: ?Medications  ?lactated ringers infusion ( Intravenous New Bag/Given 06/01/21 1526)  ?cefTRIAXone (ROCEPHIN) 2 g in sodium chloride 0.9 % 100 mL IVPB (2 g Intravenous New Bag/Given 06/01/21 1525)  ?azithromycin (ZITHROMAX) 500 mg in sodium chloride 0.9 % 250 mL IVPB (500 mg Intravenous New Bag/Given 06/01/21 1526)  ?insulin aspart (novoLOG) injection 0-15 Units (has no administration in time range)  ?lactated ringers bolus 1,000 mL (1,000 mLs Intravenous New Bag/Given 06/01/21 1525)   ?lactated ringers bolus 2,000 mL (2,000 mLs Intravenous New Bag/Given 06/01/21 1525)  ? ? ? ?IMPRESSION / MDM / ASSESSMENT AND PLAN / ED COURSE  ?I reviewed the triage vital signs and the nursing notes. ?             ?               ? ?Differential diagnosis includes, but is not limited to bronchitis, pneumonia, infected pleural effusion, anemia, [place] derangements and  sepsis. ? ?ECG is remarkable sinus tachycardia with a ventricular rate of 136, left axis deviation, right bundle branch block with fairly diffuse nonspecific ST changes throughout. ? ?Chest x-ray reviewed myself shows some increased haziness just inferior to the right hilum concerning for opacification.  No pneumothorax, large effusion or overt edema.  I also reviewed radiologist interpretation although does agree to findings if this is a completely negative x-ray. ? ?CMP remarkable for glucose of 334 without any other significant electrolyte or metabolic derangements.  CBC shows WBC count of 12.8 without evidence of acute anemia and normal platelets.  INR is 2.5.  PTT is 85.  Urinalysis shows some hemoglobin agree to 500 glucose but no evidence of infection.  VBG with a pH of 7.27 with a PCO2 of 52 and bicarb of 30.1. ? ?Given fever, tachycardia and tachypnea as well as leukocytosis and concern for sepsis but cultures as well as broad-spectrum antibiotics and fluids ordered.  While chest x-ray is not read by radiology is concerning for infiltrate does appear to see some opacification right hilum and symptoms of cough fever concerning for pneumonia.  I will plan to admit to medicine service for further evaluation and management. ? ?  ? ? ?FINAL CLINICAL IMPRESSION(S) / ED DIAGNOSES  ? ?Final diagnoses:  ?Sepsis, due to unspecified organism, unspecified whether acute organ dysfunction present [facility])  ?Community acquired pneumonia, unspecified laterality  ? ? ? ?Rx / DC Orders  ? ?ED Discharge Orders   ? ? None  ? ?  ? ? ? ?Note:  This document was  prepared using Dragon voice recognition software and may include unintentional dictation errors. ?  ?Lucrezia Starch, MD ?06/01/21 1556 ? ?

## 2021-06-01 NOTE — ED Triage Notes (Signed)
First Nurse Note:  ?Pt c/o sore throat and cough that started last Monday. Pt has a hx of PNA and DVT. Pt has a hx of  Per family, increased confusion and AMS for the past couple of days.  ? ?133 HR  ?164/94 ?101.5 orally  ?94% and RA  ?

## 2021-06-01 NOTE — Plan of Care (Signed)
Seizure Care Plan added  ?

## 2021-06-01 NOTE — Progress Notes (Signed)
New Bag of Lactated Ringers at 2250 going at 150 ml/hr ?

## 2021-06-01 NOTE — Assessment & Plan Note (Signed)
Stable Continue Xarelto 

## 2021-06-01 NOTE — H&P (Signed)
?History and Physical  ? ? ?PatientSherley Mercer [id number] DOB: [birth year] ?DOA: 06/01/2021 ?DOS: the patient was seen and examined on 06/01/2021 ?PCP: Leonel Ramsay, MD  ?Patient coming from: Home ? ?Chief Complaint:  ?Chief Complaint  ?Patient presents with  ? Weakness  ? ?HPI: Douglas Mercer is a 76 y.o. male with medical history significant for diabetes mellitus, seizure disorder, history of DVT on chronic anticoagulation therapy, BPH who presents to the emergency room for evaluation of a 1 week history of a cough productive of clear phlegm associated with shortness of breath, sore throat and a fever on the day of admission. ?He also complains of feeling congested and weak.  He denies having any sick contacts. ?He denies having any chest pain, no abdominal pain, no nausea, no vomiting, no changes in his bowel habits, no urinary symptoms. ?His wife who is at the bedside states that he has lost a significant amount of weight at least 40 pounds in the last 2 years.  His weight loss has been unintentional. ?Patient states that his primary care provider discontinued his glipizide due to hypoglycemia and decrease the dose of his metformin 850 mg 1/2 tablet daily. ? ?Review of Systems: As mentioned in the history of present illness. All other systems reviewed and are negative. ?Past Medical History:  ?Diagnosis Date  ? Allergic rhinitis   ? Anti-phospholipid antibody syndrome (HCC)   ? BPH (benign prostatic hyperplasia)   ? BPH (benign prostatic hypertrophy)   ? Deficiency of clotting factor ([place])   ? Diabetes mellitus without complication ([place])   ? DVT (deep venous thrombosis) ([place]) 02/2009  ? large left lower  ? ED (erectile dysfunction)   ? Epilepsy ([place])   ? last seizure 09/27/2017  ? GERD (gastroesophageal reflux disease)   ? Glaucoma   ? Hyperlipidemia   ? Hypertension   ? Internal hemorrhoids   ? Low HDL (under 40)   ? Pneumonia   ? Pulmonary embolism ([place]) 1992  ? bilateral w/ PE reportedly (+) lupus  anticoagulant  ? Rosacea   ? Tubular adenoma of colon   ? ?Past Surgical History:  ?Procedure Laterality Date  ? ADENOIDECTOMY  1959  ? ANTERIOR CERVICAL DECOMP/DISCECTOMY FUSION N/A 12/27/2020  ? Procedure: C3-6 ANTERIOR CERVICAL DECOMPRESSION/DISCECTOMY FUSION 3 LEVELS;  Surgeon: Deetta Perla, MD;  Location: [facility] ORS;  Service: Neurosurgery;  Laterality: N/A;  ? CATARACT EXTRACTION, BILATERAL    ? L eye on 04/24/17 and R eye on 05/08/17.  ? COLONOSCOPY  05/07/2006  ? colonoscopy with polypectomy  07/07/2002  ? 2 small descending colon polyps  ? colonoscopy with polypectomy  04/19/2001  ? 76m sessile mucosal polyp in distal sigmoid colon  ? COLONOSCOPY WITH PROPOFOL N/A 05/03/2021  ? Procedure: COLONOSCOPY WITH PROPOFOL;  Surgeon: LLesly Rubenstein MD;  [facility];  Service: Endoscopy;  Laterality: N/A;  ? diverticulosis  07/07/2002,04/19/2001  ? mild sigmoid  ? ESOPHAGOGASTRODUODENOSCOPY    ? ESOPHAGOGASTRODUODENOSCOPY (EGD) WITH PROPOFOL N/A 05/03/2021  ? Procedure: ESOPHAGOGASTRODUODENOSCOPY (EGD) WITH PROPOFOL;  Surgeon: LLesly Rubenstein MD;  Location: [facility] ENDOSCOPY;  Service: Endoscopy;  Laterality: N/A;  DM  ? HERNIA REPAIR    ? 1995 and 08/13/2009  ? [place]  ? right-sided, mesh placed  ? PROSTATE SURGERY    ? 18 biopsies.  Benign.  ? TONSILLECTOMY  1959  ? TRANSURETHRAL RESECTION OF PROSTATE  08/13/09  ? TRANSURETHRAL RESECTION OF PROSTATE  08/13/2009  ? ?Social History:  reports that he has never smoked. He has never been exposed to tobacco smoke. He has never used smokeless tobacco. He reports that he does not currently use alcohol. He reports that he does not use drugs. ? ?No Known Allergies ? ?Family History  ?Problem Relation Age of Onset  ? Diabetes Mother   ? Melanoma Mother   ? COPD Mother   ? COPD Father   ? Heart attack Maternal Uncle   ? Stroke Maternal Aunt   ?     grandparents  ? Colon cancer Paternal Uncle   ?     x2  ? Prostate cancer Neg Hx   ? Seizures  Neg Hx   ? Esophageal cancer Neg Hx   ? ? ?Prior to Admission medications   ?Medication Sig Start Date End Date Taking? Authorizing Provider  ?aspirin EC 81 MG tablet Take 81 mg by mouth daily. Swallow whole.    [provider]  ?atorvastatin (LIPITOR) 10 MG tablet Take 1 tablet (10 mg total) by mouth daily at 6 PM. 09/28/17   Emeterio Reeve, DO  ?Co-Enzyme Q-10 100 MG CAPS Take 100 mg by mouth daily.    [provider]  ?finasteride (PROSCAR) 5 MG tablet Take 1 tablet (5 mg total) by mouth daily. 07/09/17   Emeterio Reeve, DO  ?fluticasone (FLONASE) 50 MCG/ACT nasal spray Place 2 sprays into the nose daily. 02/05/12   Colon Branch, MD  ?glipiZIDE (GLUCOTROL) 5 MG tablet Take 5 mg by mouth daily. ?Patient not taking: Reported on 05/03/2021 01/20/21   [provider]  ?latanoprost (XALATAN) 0.005 % ophthalmic solution Place 1 drop into both eyes at bedtime. 03/06/16   [provider]  ?levETIRAcetam (KEPPRA) 1000 MG tablet Take 1 tablet (1,000 mg total) by mouth 2 (two) times daily. 10/10/17   Ward Givens, NP  ?losartan (COZAAR) 25 MG tablet Take 25 mg by mouth daily.    [provider]  ?metFORMIN (GLUCOPHAGE) 850 MG tablet TAKE 1 TABLET TWICE A DAY  WITH A MEAL 08/30/17   Emeterio Reeve, DO  ?pantoprazole (PROTONIX) 40 MG tablet Take 1 tablet (40 mg total) by mouth daily. 07/09/17   Emeterio Reeve, DO  ?polyethylene glycol-electrolytes (NULYTELY) 420 g solution Take 4,000 mLs by mouth as directed. 03/18/21   [provider]  ?Propylene Glycol 0.6 % SOLN Place 1 drop into both eyes 2 (two) times daily as needed (dry eyes).    [provider]  ?vitamin B-12 (CYANOCOBALAMIN) 1000 MCG tablet Take 1,000 mcg by mouth daily.    [provider]  ?XARELTO 20 MG TABS tablet Take 20 mg by mouth daily. 02/28/21   [provider]  ? ? ?Physical Exam: ?Vitals:  ? 06/01/21 1442 06/01/21 1444  ?BP:  (!) 144/100  ?Pulse: (!) 132   ?Resp: (!) 25    ?Temp: (!) 101.3 ?F (38.5 ?C)   ?TempSrc: Oral   ?SpO2:  99%  ? ?Physical Exam ?Vitals and nursing note reviewed.  ?Constitutional:   ?   Comments: Thin and frail.  Acutely ill-appearing  ?HENT:  ?   Head: Normocephalic and atraumatic.  ?   Nose: Nose normal.  ?   Mouth/Throat:  ?   Mouth: Mucous membranes are dry.  ?Eyes:  ?   Pupils: Pupils are equal, round, and reactive to light.  ?Cardiovascular:  ?   Rate and Rhythm: Tachycardia present.  ?Pulmonary:  ?   Breath sounds: Rhonchi present.  ?   Comments: Scattered  rhonchi in both lung fields ?Abdominal:  ?   General: Abdomen is flat. Bowel sounds are normal.  ?   Palpations: Abdomen is soft.  ?Musculoskeletal:     ?   General: Normal range of motion.  ?   Cervical back: Normal range of motion and neck supple.  ?Skin: ?   General: Skin is warm and dry.  ?Neurological:  ?   General: No focal deficit present.  ?   Mental Status: He is oriented to person, place, and time.  ?Psychiatric:     ?   Mood and Affect: Mood normal.     ?   Behavior: Behavior normal.  ? ? ?Data Reviewed: ?Relevant notes from primary care and specialist visits, past discharge summaries as available in EHR, including Care Everywhere. ?Prior diagnostic testing as pertinent to current admission diagnoses ?Updated medications and problem lists for reconciliation ?ED course, including vitals, labs, imaging, treatment and response to treatment ?Triage notes, nursing and pharmacy notes and ED provider's notes ?Notable results as noted in HPI ?Labs reviewed.  Lactic acid 3.6, sodium 133, potassium 4.3, chloride 99, bicarb 28, glucose 334, BUN 19, creatinine 0.87, white count 12.8 ?Respiratory viral panel is negative ?Chest x-ray reviewed by me shows no evidence of acute cardiopulmonary disease ?Twelve-lead EKG reviewed by me shows sinus tachycardia with left axis deviation and a right bundle branch block ?There are no new results to review at this time. ? ?Assessment and Plan: ?* Sepsis ([place]) ?As  evidenced by fever with a Tmax of 101.3, tachycardia, tachypnea, leukocytosis as well as lactic acidosis. ?Source of sepsis is suspected to be possible community-acquired pneumonia due to patient's sympto

## 2021-06-01 NOTE — Assessment & Plan Note (Signed)
As evidenced by fever with a Tmax of 101.3, tachycardia, tachypnea, leukocytosis as well as lactic acidosis. ?Source of sepsis is suspected to be possible community-acquired pneumonia due to patient's symptoms and clinical presentation ?Continue aggressive IV fluid resuscitation ?Trend lactic acid level ?Treat patient empirically with Rocephin and Zithromax ?Follow-up results of blood cultures ?

## 2021-06-01 NOTE — Plan of Care (Signed)

## 2021-06-01 NOTE — Assessment & Plan Note (Signed)
Treatment as outlined in 1 

## 2021-06-01 NOTE — Progress Notes (Signed)
Patient's night medications have been administered.  ?Patient's blood sugar measured is 210 at 10:20 pm. Patient maintains that he does not want to take any insulin ?

## 2021-06-01 NOTE — Assessment & Plan Note (Signed)
Patient with a history of diabetes mellitus on oral hypoglycemic agents. ?He states that his PCP discontinued his glipizide due to hypoglycemic episodes and decreased his metformin 850 mg to 1/2 tablet daily.  He also liberalized his diet. ?He is noted to have hyperglycemia with blood sugars over 300 ?Continue aggressive IV fluid resuscitation ?Sliding scale insulin for glycemic control ?Maintain consistent carbohydrate diet ?

## 2021-06-01 NOTE — Sepsis Progress Note (Signed)
Sepsis protocol monitored by eLink 

## 2021-06-01 NOTE — Assessment & Plan Note (Addendum)
Stable Continue Keppra Place patient on seizure precautions 

## 2021-06-02 ENCOUNTER — Encounter: Payer: Self-pay | Admitting: Internal Medicine

## 2021-06-02 DIAGNOSIS — J189 Pneumonia, unspecified organism: Secondary | ICD-10-CM | POA: Diagnosis not present

## 2021-06-02 DIAGNOSIS — A419 Sepsis, unspecified organism: Secondary | ICD-10-CM | POA: Diagnosis not present

## 2021-06-02 LAB — GLUCOSE, CAPILLARY
Glucose-Capillary: 176 mg/dL — ABNORMAL HIGH (ref 70–99)
Glucose-Capillary: 289 mg/dL — ABNORMAL HIGH (ref 70–99)
Glucose-Capillary: 174 mg/dL — ABNORMAL HIGH (ref 70–99)
Glucose-Capillary: 142 mg/dL — ABNORMAL HIGH (ref 70–99)

## 2021-06-02 LAB — CBC
HCT: 35.5 % — ABNORMAL LOW (ref 39.0–52.0)
Hemoglobin: 11.6 g/dL — ABNORMAL LOW (ref 13.0–17.0)
MCH: 29.7 pg (ref 26.0–34.0)
MCHC: 32.7 g/dL (ref 30.0–36.0)
MCV: 91 fL (ref 80.0–100.0)
Platelets: 179 10*3/uL (ref 150–400)
RBC: 3.9 MIL/uL — ABNORMAL LOW (ref 4.22–5.81)
RDW: 13.4 % (ref 11.5–15.5)
WBC: 7.8 10*3/uL (ref 4.0–10.5)
nRBC: 0 % (ref 0.0–0.2)

## 2021-06-02 LAB — URINE CULTURE: Culture: NO GROWTH

## 2021-06-02 LAB — PROCALCITONIN: Procalcitonin: 0.94 ng/mL

## 2021-06-02 LAB — HEMOGLOBIN A1C
Hgb A1c MFr Bld: 9 % — ABNORMAL HIGH (ref 4.8–5.6)
Mean Plasma Glucose: 211.6 mg/dL

## 2021-06-02 LAB — PROTIME-INR
INR: 1.4 — ABNORMAL HIGH (ref 0.8–1.2)
Prothrombin Time: 16.6 seconds — ABNORMAL HIGH (ref 11.4–15.2)

## 2021-06-02 LAB — BASIC METABOLIC PANEL
Anion gap: 3 — ABNORMAL LOW (ref 5–15)
BUN: 12 mg/dL (ref 8–23)
CO2: 28 mmol/L (ref 22–32)
Calcium: 8.8 mg/dL — ABNORMAL LOW (ref 8.9–10.3)
Chloride: 105 mmol/L (ref 98–111)
Creatinine, Ser: 0.81 mg/dL (ref 0.61–1.24)
GFR, Estimated: >60 mL/min (ref >60)
Glucose, Bld: 193 mg/dL — ABNORMAL HIGH (ref 70–99)
Potassium: 4.2 mmol/L (ref 3.5–5.1)
Sodium: 136 mmol/L (ref 135–145)

## 2021-06-02 LAB — CORTISOL-AM, BLOOD: Cortisol - AM: 8.3 ug/dL (ref 6.7–22.6)

## 2021-06-02 NOTE — Progress Notes (Signed)
Patient refuses insulin. Blood Sugar is 142. Will continue to monitor ?

## 2021-06-02 NOTE — Progress Notes (Signed)
Mobility Specialist - Progress Note ? ? 06/02/21 1541  ?Mobility  ?Activity Ambulated with assistance in hallway  ?Level of Assistance Standby assist, set-up cues, supervision of patient - no hands on  ?Assistive Device None  ?Distance Ambulated (ft) 360 ft  ?Activity Response Tolerated well  ?$Mobility charge 1 Mobility  ? ? ? ?Pt ambulated in hallway with supervision. Mild sway. No complaints. Pt returned to bed with needs in reach.  ? ? ?Kathee Delton ?Mobility Specialist ?06/02/21, 3:42 PM ? ? ? ? ?

## 2021-06-02 NOTE — Progress Notes (Addendum)
? ? ? ?Progress Note  ? ? ?Douglas Mercer  [id number] DOB: 01/29/1946  DOA: 06/01/2021 ?PCP: Douglas Ramsay, MD  ? ? ? ? ?Brief Narrative:  ? ? ?Medical records reviewed and are as summarized below: ? ?Douglas Mercer is a 76 y.o. male with medical history significant for type 2 diabetes mellitus, seizure disorder (last seizure was in 2019), history of DVT on Xarelto, antiphospholipid syndrome BPH, who presented to the hospital because of 1 week history of productive cough, sore throat, and increasing shortness of breath.  He developed generalized weakness, fever and chills about a day prior to admission. ? ?He was admitted to the hospital for sepsis secondary to community-acquired pneumonia. ? ? ?Assessment/Plan:  ? ?Principal Problem: ?  Sepsis ([place]) ?Active Problems: ?  Simple partial onset seizures followed by impaired consciousness ([place]) ?  Anti-phospholipid antibody syndrome (HCC) ?  Uncontrolled type 2 diabetes mellitus with hyperglycemia, without long-term current use of insulin ([place]) ?  CAP (community acquired pneumonia) ? ? ? ?Body mass index is 19.98 kg/m?. ? ? ?Sepsis secondary to community-acquired pneumonia: Continue empiric IV antibiotics.  Follow-up blood cultures.  Discontinue IV fluids later today. ? ?Type 2 diabetes mellitus with hyperglycemia: Metformin and glipizide have been held.  He said his PCP had decreased his metformin from 850 to 425 mg daily.  Use NovoLog as needed for hyperglycemia.  Follow-up with PCP for management of diabetes. ?Hemoglobin A1c was 9 on this admission.  Chart review showed that hemoglobin A1c was 6 about 3 months ago on 02/09/2021. ? ?Seizure disorder: No recent seizures.  Continue Keppra ? ?History of DVT and antiphospholipid syndrome: Continue Xarelto ? ?Generalized weakness: Consulted PT. ? ?15 mm right thyroid nodule: Patient and his wife said they are aware of this thyroid nodule.  He said he had a biopsy of the thyroid nodule in March 2023 and pathology  report came back as benign.  He said he was informed by Dr. Haig Prophet, gastroenterologist, not to worry about it.  I could not see a record of this from EMR. ? ?5 mm stable right lower lobe nodule: Patient said he is aware of this and he has been there for years. ? ? ? ? ?Diet Order   ? ?       ?  Diet Carb Modified Fluid consistency: Thin; Room service appropriate? Yes  Diet effective now       ?  ? ?  ?  ? ?  ? ? ? ? ? ? ?Consultants: ?None ? ?Procedures: ?None ? ? ? ?Medications:  ? ? aspirin EC  81 mg Oral Daily  ? atorvastatin  10 mg Oral q1800  ? finasteride  5 mg Oral Daily  ? insulin aspart  0-9 Units Subcutaneous TID WC  ? latanoprost  1 drop Both Eyes QHS  ? levETIRAcetam  1,000 mg Oral BID  ? pantoprazole  40 mg Oral Daily  ? rivaroxaban  20 mg Oral Q supper  ? vitamin B-12  1,000 mcg Oral Daily  ? ?Continuous Infusions: ? azithromycin Stopped (06/01/21 1629)  ? cefTRIAXone (ROCEPHIN)  IV 2 g (06/02/21 1556)  ? ? ? ?Anti-infectives (From admission, onward)  ? ? Start     Dose/Rate Route Frequency Ordered Stop  ? 06/01/21 1630  cefTRIAXone (ROCEPHIN) 2 g in sodium chloride 0.9 % 100 mL IVPB  Status:  Discontinued       ? 2 g ?200 mL/hr over 30 Minutes Intravenous Every 24 hours 06/01/21 1628  06/01/21 1631  ? 06/01/21 1630  azithromycin (ZITHROMAX) 500 mg in sodium chloride 0.9 % 250 mL IVPB  Status:  Discontinued       ? 500 mg ?250 mL/hr over 60 Minutes Intravenous Every 24 hours 06/01/21 1628 06/01/21 1631  ? 06/01/21 1515  cefTRIAXone (ROCEPHIN) 2 g in sodium chloride 0.9 % 100 mL IVPB       ? 2 g ?200 mL/hr over 30 Minutes Intravenous Every 24 hours 06/01/21 1500 06/06/21 1514  ? 06/01/21 1515  azithromycin (ZITHROMAX) 500 mg in sodium chloride 0.9 % 250 mL IVPB       ? 500 mg ?250 mL/hr over 60 Minutes Intravenous Every 24 hours 06/01/21 1500 06/06/21 1514  ? ?  ? ? ? ? ? ? ? ? ? ?Family Communication/Anticipated D/C date and plan/Code Status  ? ?DVT prophylaxis:  ?rivaroxaban (XARELTO) tablet 20 mg   ? ?  Code Status: Full Code ? ?Family Communication: Plan discussed with his wife at the bedside ?Disposition Plan: Possible discharge home in 1 to 2 days ? ? ?Status is: Inpatient ?Remains inpatient appropriate because: IV antibiotics for sepsis from pneumonia ? ? ? ? ? ? ?Subjective:  ? ?Interval events noted.  He complains of cough and generalized weakness.  His wife was at the bedside ? ?Objective:  ? ? ?Vitals:  ? 06/02/21 0400 06/02/21 0717 06/02/21 1147 06/02/21 1516  ?BP: 137/66 124/72 (!) 138/53 136/65  ?Pulse: 75 76 73 66  ?Resp: '17 18 18 18  '$ ?Temp: 98.4 ?F (36.9 ?C) 98 ?F (36.7 ?C) 97.8 ?F (36.6 ?C) 97.9 ?F (36.6 ?C)  ?TempSrc: Oral   Oral  ?SpO2:  100% 100% 100%  ?Weight:      ?Height:      ? ?No data found. ? ? ?Intake/Output Summary (Last 24 hours) at 06/02/2021 1621 ?Last data filed at 06/02/2021 1500 ?Gross per 24 hour  ?Intake 7398.11 ml  ?Output 2400 ml  ?Net 4998.11 ml  ? ?Filed Weights  ? 06/01/21 1818  ?Weight: 61.4 kg  ? ? ?Exam: ? ?GEN: NAD ?SKIN: No rash ?EYES: EOMI ?ENT: MMM ?CV: RRR ?PULM: Bibasilar rales.  No wheezing heard. ?ABD: soft, ND, NT, +BS ?CNS: AAO x 3, non focal ?EXT: No edema or tenderness ? ? ? ?  ? ? ?Data Reviewed:  ? ?I have personally reviewed following labs and imaging studies: ? ?Labs: ?Labs show the following:  ? ?Basic Metabolic Panel: ?Recent Labs  ?Lab 06/01/21 ?1445 06/02/21 ?2130  ?NA 133* 136  ?K 4.3 4.2  ?CL 99 105  ?CO2 28 28  ?GLUCOSE 334* 193*  ?BUN 19 12  ?CREATININE 0.87 0.81  ?CALCIUM 8.9 8.8*  ? ?GFR ?Estimated Creatinine Clearance: 68.4 mL/min (by C-G formula based on SCr of 0.81 mg/dL). ?Liver Function Tests: ?Recent Labs  ?Lab 06/01/21 ?1445  ?AST 18  ?ALT 13  ?ALKPHOS 73  ?BILITOT 1.3*  ?PROT 7.1  ?ALBUMIN 3.7  ? ?No results for input(s): LIPASE, AMYLASE in the last 168 hours. ?No results for input(s): AMMONIA in the last 168 hours. ?Coagulation profile ?Recent Labs  ?Lab 06/01/21 ?1445 06/02/21 ?8657  ?INR 2.5* 1.4*  ? ? ?CBC: ?Recent Labs  ?Lab  06/01/21 ?1445 06/02/21 ?8469  ?WBC 12.8* 7.8  ?NEUTROABS 12.0*  --   ?HGB 13.4 11.6*  ?HCT 41.9 35.5*  ?MCV 92.7 91.0  ?PLT 206 179  ? ?Cardiac Enzymes: ?No results for input(s): CKTOTAL, CKMB, CKMBINDEX, TROPONINI in the last 168 hours. ?BNP (last  3 results) ?No results for input(s): PROBNP in the last 8760 hours. ?CBG: ?Recent Labs  ?Lab 06/01/21 ?1609 06/01/21 ?2220 06/02/21 ?0719 06/02/21 ?1145  ?GLUCAP 353* 210* 176* 289*  ? ?D-Dimer: ?No results for input(s): DDIMER in the last 72 hours. ?Hgb A1c: ?Recent Labs  ?  06/01/21 ?1828  ?HGBA1C 9.0*  ? ?Lipid Profile: ?No results for input(s): CHOL, HDL, LDLCALC, TRIG, CHOLHDL, LDLDIRECT in the last 72 hours. ?Thyroid function studies: ?No results for input(s): TSH, T4TOTAL, T3FREE, THYROIDAB in the last 72 hours. ? ?Invalid input(s): FREET3 ?Anemia work up: ?No results for input(s): VITAMINB12, FOLATE, FERRITIN, TIBC, IRON, RETICCTPCT in the last 72 hours. ?Sepsis Labs: ?Recent Labs  ?Lab 06/01/21 ?1445 06/01/21 ?1828 06/02/21 ?0355  ?PROCALCITON 0.30  --  0.94  ?WBC 12.8*  --  7.8  ?LATICACIDVEN 3.6* 2.2*  --   ? ? ?Microbiology ?Recent Results (from the past 240 hour(s))  ?Urine Culture     Status: None  ? Collection Time: 06/01/21  2:45 PM  ? Specimen: Urine, Random  ?Result Value Ref Range Status  ? Specimen Description   Final  ?  URINE, RANDOM ?Performed at [facility], [address] [place], [place] [address] ?  ? Special Requests   Final  ?  NONE ?Performed at [facility], [address] [place], [place] [address] ?  ? Culture   Final  ?  NO GROWTH ?Performed at [facility], [place] [address], [place], [place] [address] ?  ? Report Status 06/02/2021 FINAL  Final  ?Resp Panel by RT-PCR (Flu A&B, Covid) Nasopharyngeal Swab     Status: None  ? Collection Time: 06/01/21  2:45 PM  ? Specimen: Nasopharyngeal Swab; Nasopharyngeal(NP) swabs in vial transport medium  ?Result Value Ref Range Status  ? SARS Coronavirus 2 by RT PCR NEGATIVE  NEGATIVE Final  ?  Comment: (NOTE) ?SARS-CoV-2 target nucleic acids are NOT DETECTED. ? ?The SARS-CoV-2 RNA is generally detectable in upper respiratory ?specimens during the acute phase of infection. The lo

## 2021-06-02 NOTE — Evaluation (Signed)
Physical Therapy Evaluation ?Patient Details ?Name: Douglas Mercer ?MRN: [id number] ?DOB: 27-Mar-1945 ?Today's Date: 06/02/2021 ? ?History of Present Illness ? presented to ER secondary to AMS, weakness, cough; admitted for management of sepsis related to CAP (covid-negative).  ?Clinical Impression ? Patient resting in bed upon arrival to room; wife at bedside.  Patient alert and oriented, follows commands and agreeable to participation with session.  Denies pain and endorses improvement in overall feeling/malaise since admission.  Mild baseline/residual weakness to L UE (biceps) and L LE due to previous pinched nerve/cervical surgery; unchanged with this admission.  Bilat UE/LE strength and ROM grossly WFL for basic transfers and gait.  Able to complete bed mobility with indep; sit/stand, basic transfers and gait (400') without assist device, mod indep.  Demonstrates reciprocal stepping pattern with good step height/length; mild sustained L knee flexion throughout gait cycle. Limited L arm swing. Completes dynamic gait components (head turns, changes of direction, obstacle negotiation) without overt buckling or LOB.  Slight sway at times, but self-corrects without assist from therapist ?Appears to be at/near baseline level of functional ability; patient/wife in agreement. No skilled PT needs identified, but will engage with mobility specialists to ensure highest-level mobility throughout remaining hospitalization. ?   ? ?Recommendations for follow up therapy are one component of a multi-disciplinary discharge planning process, led by the attending physician.  Recommendations may be updated based on patient status, additional functional criteria and insurance authorization. ? ?Follow Up Recommendations No PT follow up ? ?  ?Assistance Recommended at Discharge PRN  ?Patient can return home with the following ?   ? ?  ?Equipment Recommendations    ?Recommendations for Other Services ?    ?  ?Functional Status Assessment  Patient has not had a recent decline in their functional status  ? ?  ?Precautions / Restrictions Precautions ?Precautions: Fall ?Restrictions ?Weight Bearing Restrictions: No  ? ?  ? ?Mobility ? Bed Mobility ?Overal bed mobility: Modified Independent ?  ?  ?  ?  ?  ?  ?  ?  ? ?Transfers ?Overall transfer level: Modified independent ?Equipment used: None ?  ?  ?  ?  ?  ?  ?  ?  ?  ? ?Ambulation/Gait ?Ambulation/Gait assistance: Supervision ?Gait Distance (Feet): 400 Feet ?Assistive device: None ?  ?Gait velocity: 10' walk time, 8 seconds ?  ?  ?General Gait Details: reciprocal stepping pattern with good step height/length; mild sustained L knee flexion throughout gait cycle. Limited L arm swing. Completes dynamic gait components (head turns, changes of direction, obstacle negotiation) without overt buckling or LOB.  Slight sway at times, but self-corrects without assist from therapist ? ?Stairs ?  ?  ?  ?  ?  ? ?Wheelchair Mobility ?  ? ?Modified Rankin (Stroke Patients Only) ?  ? ?  ? ?Balance Overall balance assessment: Needs assistance ?Sitting-balance support: No upper extremity supported, Feet supported ?Sitting balance-Leahy Scale: Good ?  ?  ?Standing balance support: No upper extremity supported ?Standing balance-Leahy Scale: Good ?  ?  ?  ?  ?  ?  ?  ?  ?  ?  ?  ?  ?   ? ? ? ?Pertinent Vitals/Pain Pain Assessment ?Pain Assessment: No/denies pain  ? ? ?Home Living Family/patient expects to be discharged to:: Private residence ?Living Arrangements: Spouse/significant other ?Available Help at Discharge: Available 24 hours/day;Family ?Type of Home: Apartment ?Home Access: Level entry ?  ?  ?  ?Home Layout: One level ?Home Equipment: Rollator (4  wheels);Cane - quad;Cane - single point ?Additional Comments: No RW, all AD are wife's  ?  ?Prior Function Prior Level of Function : Independent/Modified Independent ?  ?  ?  ?  ?  ?  ?Mobility Comments: Did not used AD, no assistance ?ADLs Comments: Used mostly RUE due  to LUE weakness, did not require assistance ?  ? ? ?Hand Dominance  ? Dominant Hand: Right ? ?  ?Extremity/Trunk Assessment  ? Upper Extremity Assessment ?Upper Extremity Assessment: Overall WFL for tasks assessed (L biceps grossly 1/5 (baseline), otherwise, UE at least 4-/5) ?  ? ?Lower Extremity Assessment ?Lower Extremity Assessment: Overall WFL for tasks assessed (grossly at least 4-/5 throughout; mild functional weakness to L LE noted, but no overt buckling or LOB resulting) ?  ? ?   ?Communication  ? Communication: No difficulties  ?Cognition Arousal/Alertness: Awake/alert ?Behavior During Therapy: [facility] for tasks assessed/performed ?Overall Cognitive Status: Within Functional Limits for tasks assessed ?  ?  ?  ?  ?  ?  ?  ?  ?  ?  ?  ?  ?  ?  ?  ?  ?  ?  ?  ? ?  ?General Comments   ? ?  ?Exercises    ? ?Assessment/Plan  ?  ?PT Assessment Patient needs continued PT services  ?PT Problem List Decreased strength;Decreased range of motion ? ?   ?  ?PT Treatment Interventions DME instruction;Stair training;Functional mobility training;Therapeutic activities;Therapeutic exercise;Balance training;Gait training;Patient/family education   ? ?PT Goals (Current goals can be found in the Care Plan section)  ?Acute Rehab PT Goals ?Patient Stated Goal: to get stronger and get back home ?PT Goal Formulation: All assessment and education complete, DC therapy ?Time For Goal Achievement: 06/02/21 ?Potential to Achieve Goals: Good ? ?  ?Frequency Min 2X/week ?  ? ? ?Co-evaluation   ?  ?  ?  ?  ? ? ?  ?AM-PAC PT "6 Clicks" Mobility  ?Outcome Measure Help needed turning from your back to your side while in a flat bed without using bedrails?: None ?Help needed moving from lying on your back to sitting on the side of a flat bed without using bedrails?: None ?Help needed moving to and from a bed to a chair (including a wheelchair)?: None ?Help needed standing up from a chair using your arms (e.g., wheelchair or bedside chair)?:  None ?Help needed to walk in hospital room?: None ?Help needed climbing 3-5 steps with a railing? : None ?6 Click Score: 24 ? ?  ?End of Session Equipment Utilized During Treatment: Gait belt ?Activity Tolerance: Patient tolerated treatment well ?Patient left: in chair;with call bell/phone within reach;with chair alarm set ?Nurse Communication: Mobility status ?PT Visit Diagnosis: Muscle weakness (generalized) (M62.81);Difficulty in walking, not elsewhere classified (R26.2) ?  ? ?Time: 2330-0762 ?PT Time Calculation (min) (ACUTE ONLY): 22 min ? ? ?Charges:   PT Evaluation ?$PT Eval Moderate Complexity: 1 Mod ?  ?  ?   ? ? ?Calden Dorsey H. Owens Shark, PT, DPT, NCS ?06/02/21, 12:28 PM ?[phone] ? ?

## 2021-06-03 DIAGNOSIS — A419 Sepsis, unspecified organism: Secondary | ICD-10-CM | POA: Diagnosis not present

## 2021-06-03 DIAGNOSIS — J189 Pneumonia, unspecified organism: Secondary | ICD-10-CM | POA: Diagnosis not present

## 2021-06-03 LAB — GLUCOSE, CAPILLARY: Glucose-Capillary: 204 mg/dL — ABNORMAL HIGH (ref 70–99)

## 2021-06-03 MED ORDER — AMOXICILLIN-POT CLAVULANATE 875-125 MG PO TABS: 1.0000 | ORAL_TABLET | Freq: Two times a day (BID) | ORAL | 0 refills | Status: AC

## 2021-06-03 NOTE — Discharge Summary (Addendum)
? ?Physician Discharge Summary  ?Douglas Mercer EPP:[id number] DOB: May 08, 1945 DOA: 06/01/2021 ? ?PCP: Leonel Ramsay, MD ? ?Admit date: 06/01/2021 ?Discharge date: 06/03/2021 ? ?Discharge disposition: Home ? ? ?Recommendations for Outpatient Follow-Up:  ? ?Follow-up with PCP in 1 week ? ? ?Discharge Diagnosis:  ? ?Principal Problem: ?  Sepsis ([place]) ?Active Problems: ?  Simple partial onset seizures followed by impaired consciousness ([place]) ?  Anti-phospholipid antibody syndrome (HCC) ?  Uncontrolled type 2 diabetes mellitus with hyperglycemia, without long-term current use of insulin ([place]) ?  CAP (community acquired pneumonia) ? ? ? ?Discharge Condition: Stable. ? ?Diet recommendation:  ?Diet Order   ? ?       ?  Diet - low sodium heart healthy       ?  ?  Diet Carb Modified       ?  ?  Diet Carb Modified Fluid consistency: Thin; Room service appropriate? Yes  Diet effective now       ?  ? ?  ?  ? ?  ? ? ?  Code Status: Full Code ? ? ? ? ?Hospital Course:  ? ?[name] is a 76 y.o. male with medical history significant for type 2 diabetes mellitus, seizure disorder (last seizure was in 2019), history of DVT on Xarelto, antiphospholipid syndrome BPH, who presented to the hospital because of 1 week history of productive cough, sore throat, and increasing shortness of breath.  He developed generalized weakness, fever and chills about a day prior to admission. ?  ?He was admitted to the hospital for sepsis secondary to community-acquired pneumonia.  He was treated with IV fluids and empiric IV antibiotics.  His condition has improved and he has been able to ambulate without any symptoms.  He has type II DM with hyperglycemia.  He said he was previously taking glipizide but this was discontinued by his PCP and metformin had been decreased from 852 four 425 mg daily.  He wants to go back to taking metformin 850 mg daily.  Prefers to hold off on glipizide until further discussions with his PCP.  His condition  has improved and he is deemed stable for discharge to home today. ?  ? ? ?Assessment and plan ? ?Sepsis secondary to community-acquired pneumonia: No growth on blood cultures thus far.  Discharge on Augmentin ? ?Type 2 diabetes mellitus with hyperglycemia: Resume metformin 850 mg daily at discharge. ?Hemoglobin A1c was 9 on this admission.  Chart review showed that hemoglobin A1c was 6 about 3 months ago on 02/09/2021. ?  ?Seizure disorder: No recent seizures.  Continue Keppra ? ?History of DVT and antiphospholipid syndrome: Continue Xarelto ?  ?Generalized weakness: No PT follow-up ?  ?15 mm right thyroid nodule: Patient and his wife said they are aware of this thyroid nodule.  He said he had a biopsy of the thyroid nodule in March 2023 and pathology report came back as benign.  He said he was informed by Dr. Haig Prophet, gastroenterologist, not to worry about it.  I could not see a record of this from EMR. ?  ?5 mm stable right lower lobe nodule: Patient said he is aware of this and he has been there for years. ?  ?  ? ? ? ? ?Discharge Exam:  ? ? ?Vitals:  ? 06/02/21 2004 06/03/21 0031 06/03/21 0400 06/03/21 0822  ?BP: 122/71 120/69 124/65 133/72  ?Pulse: 72 78 80 85  ?Resp: '17 16 18 17  '$ ?Temp: 98.1 ?F (36.7 ?C) 98.2 ?  F (36.8 ?C) 98.1 ?F (36.7 ?C) 98.1 ?F (36.7 ?C)  ?TempSrc:  Oral Oral   ?SpO2: 100% 100% 100% 99%  ?Weight:      ?Height:      ? ? ? ?GEN: NAD ?SKIN: No rash ?EYES: EOMI ?ENT: MMM, no palpable lymph nodes or thyroid nodules ?CV: RRR ?PULM: Bibasilar rales, no wheezing ?ABD: soft, ND, NT, +BS ?CNS: AAO x 3, non focal ?EXT: No edema or tenderness ? ? ?The results of significant diagnostics from this hospitalization (including imaging, microbiology, ancillary and laboratory) are listed below for reference.   ? ? ?Procedures and Diagnostic Studies:  ? ?CT Angio Chest PE W and/or Wo Contrast ? ?Result Date: 06/01/2021 ?CLINICAL DATA:  Pulmonary embolism (PE) suspected, unknown D-dimer also likely pneumonia  EXAM: CT ANGIOGRAPHY CHEST WITH CONTRAST TECHNIQUE: Multidetector CT imaging of the chest was performed using the standard protocol during bolus administration of intravenous contrast. Multiplanar CT image reconstructions and MIPs were obtained to evaluate the vascular anatomy. RADIATION DOSE REDUCTION: This exam was performed according to the departmental dose-optimization program which includes automated exposure control, adjustment of the mA and/or kV according to patient size and/or use of iterative reconstruction technique. CONTRAST:  45m OMNIPAQUE IOHEXOL 350 MG/ML SOLN COMPARISON:  Chest radiograph earlier today.  Chest CT 10/14/2020 FINDINGS: Cardiovascular: There are no filling defects within the pulmonary arteries to suggest pulmonary embolus. No aortic dissection, aneurysm, or acute aortic findings. Conventional branching pattern from the aortic arch, aortic branch vessels are patent. Heart is normal in size. No pericardial effusion. Mediastinum/Nodes: Small mediastinal and hilar lymph nodes are all subcentimeter short axis and not enlarged by size criteria. There is a 15 mm hypodense right thyroid nodule. No esophageal wall thickening. Lungs/Pleura: Ill-defined linear and patchy opacities in the dependent lower lobes, some of which was present on prior exam and represent underlying scarring. Left upper lobe perifissural scarring is unchanged. Stable right lower lobe 5 mm nodule series 7, image 98, stable from 2018 and considered benign. No new pulmonary nodules. No pleural effusion. No features of pulmonary edema. Upper Abdomen: No acute or unexpected findings. Musculoskeletal: Thoracic spondylosis. There are no acute or suspicious osseous abnormalities. No chest wall soft tissue abnormalities Review of the MIP images confirms the above findings. IMPRESSION: 1. No pulmonary embolus. 2. Ill-defined linear and patchy opacities in the dependent lower lobes. This likely represents atelectasis superimposed  on scarring. The possibility of pneumonia is not entirely excluded but felt less likely. 3. Right thyroid nodule measuring 15 mm. Recommend thyroid [place](ref: J Am Coll Radiol. 2015 Feb;12(2): 143-50). Electronically Signed   By: MKeith RakeM.D.   On: 06/01/2021 16:59  ? ?DG Chest Port 1 View ? ?Result Date: 06/01/2021 ?CLINICAL DATA:  Concern for sepsis EXAM: PORTABLE CHEST 1 VIEW COMPARISON:  Chest x-ray dated January 07, 2017 FINDINGS: The heart size and mediastinal contours are within normal limits. Scattered linear opacities of the lower lungs, similar to prior radiograph and likely due to scarring. Both lungs are otherwise clear. The visualized skeletal structures are unremarkable. IMPRESSION: No active disease. Electronically Signed   By: LYetta GlassmanM.D.   On: 06/01/2021 15:15  ? ? ? ?Labs:  ? ?Basic Metabolic Panel: ?Recent Labs  ?Lab 06/01/21 ?1445 06/02/21 ?02094 ?NA 133* 136  ?K 4.3 4.2  ?CL 99 105  ?CO2 28 28  ?GLUCOSE 334* 193*  ?BUN 19 12  ?CREATININE 0.87 0.81  ?CALCIUM 8.9 8.8*  ? ?GFR ?Estimated Creatinine Clearance:  68.4 mL/min (by C-G formula based on SCr of 0.81 mg/dL). ?Liver Function Tests: ?Recent Labs  ?Lab 06/01/21 ?1445  ?AST 18  ?ALT 13  ?ALKPHOS 73  ?BILITOT 1.3*  ?PROT 7.1  ?ALBUMIN 3.7  ? ?No results for input(s): LIPASE, AMYLASE in the last 168 hours. ?No results for input(s): AMMONIA in the last 168 hours. ?Coagulation profile ?Recent Labs  ?Lab 06/01/21 ?1445 06/02/21 ?6222  ?INR 2.5* 1.4*  ? ? ?CBC: ?Recent Labs  ?Lab 06/01/21 ?1445 06/02/21 ?9798  ?WBC 12.8* 7.8  ?NEUTROABS 12.0*  --   ?HGB 13.4 11.6*  ?HCT 41.9 35.5*  ?MCV 92.7 91.0  ?PLT 206 179  ? ?Cardiac Enzymes: ?No results for input(s): CKTOTAL, CKMB, CKMBINDEX, TROPONINI in the last 168 hours. ?BNP: ?Invalid input(s): POCBNP ?CBG: ?Recent Labs  ?Lab 06/02/21 ?0719 06/02/21 ?1145 06/02/21 ?1627 06/02/21 ?2122 06/03/21 ?9211  ?GLUCAP 176* 289* 174* 142* 204*  ? ?D-Dimer ?No results for input(s): DDIMER in the last  72 hours. ?Hgb A1c ?Recent Labs  ?  06/01/21 ?1828  ?HGBA1C 9.0*  ? ?Lipid Profile ?No results for input(s): CHOL, HDL, LDLCALC, TRIG, CHOLHDL, LDLDIRECT in the last 72 hours. ?Thyroid function studies ?No

## 2021-06-05 LAB — BLOOD GAS, VENOUS
Acid-Base Excess: 3.6 mmol/L — ABNORMAL HIGH (ref 0.0–2.0)
Bicarbonate: 30.1 mmol/L — ABNORMAL HIGH (ref 20.0–28.0)
O2 Saturation: 36.6 %
Patient temperature: 37
pCO2, Ven: 52 mmHg (ref 44–60)
pH, Ven: 7.37 (ref 7.25–7.43)

## 2021-06-06 LAB — CULTURE, BLOOD (ROUTINE X 2)
Culture: NO GROWTH
Culture: NO GROWTH
Special Requests: ADEQUATE
Special Requests: ADEQUATE

## 2021-08-29 DIAGNOSIS — G629 Polyneuropathy, unspecified: Secondary | ICD-10-CM | POA: Diagnosis not present

## 2021-08-29 DIAGNOSIS — R4182 Altered mental status, unspecified: Secondary | ICD-10-CM | POA: Diagnosis not present

## 2021-08-29 DIAGNOSIS — E1142 Type 2 diabetes mellitus with diabetic polyneuropathy: Secondary | ICD-10-CM | POA: Diagnosis not present

## 2021-08-29 DIAGNOSIS — R634 Abnormal weight loss: Secondary | ICD-10-CM | POA: Diagnosis not present

## 2021-09-05 DIAGNOSIS — D6861 Antiphospholipid syndrome: Secondary | ICD-10-CM | POA: Diagnosis not present

## 2021-09-05 DIAGNOSIS — E785 Hyperlipidemia, unspecified: Secondary | ICD-10-CM | POA: Diagnosis not present

## 2021-09-05 DIAGNOSIS — E1142 Type 2 diabetes mellitus with diabetic polyneuropathy: Secondary | ICD-10-CM | POA: Diagnosis not present

## 2021-09-05 DIAGNOSIS — I10 Essential (primary) hypertension: Secondary | ICD-10-CM | POA: Diagnosis not present

## 2021-09-05 DIAGNOSIS — E1129 Type 2 diabetes mellitus with other diabetic kidney complication: Secondary | ICD-10-CM | POA: Diagnosis not present

## 2021-09-05 DIAGNOSIS — D509 Iron deficiency anemia, unspecified: Secondary | ICD-10-CM | POA: Diagnosis not present

## 2021-09-05 DIAGNOSIS — R569 Unspecified convulsions: Secondary | ICD-10-CM | POA: Diagnosis not present

## 2021-09-05 DIAGNOSIS — R634 Abnormal weight loss: Secondary | ICD-10-CM | POA: Diagnosis not present

## 2021-09-05 DIAGNOSIS — E538 Deficiency of other specified B group vitamins: Secondary | ICD-10-CM | POA: Diagnosis not present

## 2021-09-28 DIAGNOSIS — E785 Hyperlipidemia, unspecified: Secondary | ICD-10-CM | POA: Diagnosis not present

## 2021-09-28 DIAGNOSIS — E1065 Type 1 diabetes mellitus with hyperglycemia: Secondary | ICD-10-CM | POA: Diagnosis not present

## 2021-09-28 DIAGNOSIS — I152 Hypertension secondary to endocrine disorders: Secondary | ICD-10-CM | POA: Diagnosis not present

## 2021-09-28 DIAGNOSIS — R634 Abnormal weight loss: Secondary | ICD-10-CM | POA: Diagnosis not present

## 2021-09-30 DIAGNOSIS — I2782 Chronic pulmonary embolism: Secondary | ICD-10-CM | POA: Diagnosis not present

## 2021-09-30 DIAGNOSIS — E1142 Type 2 diabetes mellitus with diabetic polyneuropathy: Secondary | ICD-10-CM | POA: Diagnosis not present

## 2021-09-30 DIAGNOSIS — G40909 Epilepsy, unspecified, not intractable, without status epilepticus: Secondary | ICD-10-CM | POA: Diagnosis not present

## 2021-09-30 DIAGNOSIS — I10 Essential (primary) hypertension: Secondary | ICD-10-CM | POA: Diagnosis not present

## 2021-09-30 DIAGNOSIS — I82509 Chronic embolism and thrombosis of unspecified deep veins of unspecified lower extremity: Secondary | ICD-10-CM | POA: Diagnosis not present

## 2021-09-30 DIAGNOSIS — F325 Major depressive disorder, single episode, in full remission: Secondary | ICD-10-CM | POA: Diagnosis not present

## 2021-09-30 DIAGNOSIS — K219 Gastro-esophageal reflux disease without esophagitis: Secondary | ICD-10-CM | POA: Diagnosis not present

## 2021-09-30 DIAGNOSIS — E785 Hyperlipidemia, unspecified: Secondary | ICD-10-CM | POA: Diagnosis not present

## 2021-09-30 DIAGNOSIS — H409 Unspecified glaucoma: Secondary | ICD-10-CM | POA: Diagnosis not present

## 2021-11-11 DIAGNOSIS — E1169 Type 2 diabetes mellitus with other specified complication: Secondary | ICD-10-CM | POA: Diagnosis not present

## 2021-11-11 DIAGNOSIS — E1165 Type 2 diabetes mellitus with hyperglycemia: Secondary | ICD-10-CM | POA: Diagnosis not present

## 2021-11-11 DIAGNOSIS — E785 Hyperlipidemia, unspecified: Secondary | ICD-10-CM | POA: Diagnosis not present

## 2021-11-11 DIAGNOSIS — E1159 Type 2 diabetes mellitus with other circulatory complications: Secondary | ICD-10-CM | POA: Diagnosis not present

## 2021-11-11 DIAGNOSIS — I152 Hypertension secondary to endocrine disorders: Secondary | ICD-10-CM | POA: Diagnosis not present

## 2021-11-29 DIAGNOSIS — E1142 Type 2 diabetes mellitus with diabetic polyneuropathy: Secondary | ICD-10-CM | POA: Diagnosis not present

## 2021-11-29 DIAGNOSIS — R634 Abnormal weight loss: Secondary | ICD-10-CM | POA: Diagnosis not present

## 2021-11-29 DIAGNOSIS — G629 Polyneuropathy, unspecified: Secondary | ICD-10-CM | POA: Diagnosis not present

## 2021-11-29 DIAGNOSIS — I10 Essential (primary) hypertension: Secondary | ICD-10-CM | POA: Diagnosis not present

## 2021-11-29 DIAGNOSIS — E785 Hyperlipidemia, unspecified: Secondary | ICD-10-CM | POA: Diagnosis not present

## 2021-12-06 DIAGNOSIS — Z23 Encounter for immunization: Secondary | ICD-10-CM | POA: Diagnosis not present

## 2021-12-06 DIAGNOSIS — D649 Anemia, unspecified: Secondary | ICD-10-CM | POA: Diagnosis not present

## 2021-12-06 DIAGNOSIS — Z7901 Long term (current) use of anticoagulants: Secondary | ICD-10-CM | POA: Diagnosis not present

## 2021-12-06 DIAGNOSIS — I10 Essential (primary) hypertension: Secondary | ICD-10-CM | POA: Diagnosis not present

## 2021-12-06 DIAGNOSIS — E785 Hyperlipidemia, unspecified: Secondary | ICD-10-CM | POA: Diagnosis not present

## 2021-12-06 DIAGNOSIS — R634 Abnormal weight loss: Secondary | ICD-10-CM | POA: Diagnosis not present

## 2021-12-06 DIAGNOSIS — D6861 Antiphospholipid syndrome: Secondary | ICD-10-CM | POA: Diagnosis not present

## 2021-12-06 DIAGNOSIS — E538 Deficiency of other specified B group vitamins: Secondary | ICD-10-CM | POA: Diagnosis not present

## 2021-12-06 DIAGNOSIS — R569 Unspecified convulsions: Secondary | ICD-10-CM | POA: Diagnosis not present

## 2021-12-20 DIAGNOSIS — Z4789 Encounter for other orthopedic aftercare: Secondary | ICD-10-CM | POA: Diagnosis not present

## 2021-12-20 DIAGNOSIS — Z981 Arthrodesis status: Secondary | ICD-10-CM | POA: Diagnosis not present

## 2022-01-20 DIAGNOSIS — H401232 Low-tension glaucoma, bilateral, moderate stage: Secondary | ICD-10-CM | POA: Diagnosis not present

## 2022-01-20 DIAGNOSIS — Z961 Presence of intraocular lens: Secondary | ICD-10-CM | POA: Diagnosis not present

## 2022-01-20 DIAGNOSIS — H04123 Dry eye syndrome of bilateral lacrimal glands: Secondary | ICD-10-CM | POA: Diagnosis not present

## 2022-01-20 DIAGNOSIS — E119 Type 2 diabetes mellitus without complications: Secondary | ICD-10-CM | POA: Diagnosis not present

## 2022-02-09 DIAGNOSIS — N281 Cyst of kidney, acquired: Secondary | ICD-10-CM | POA: Diagnosis not present

## 2022-02-09 DIAGNOSIS — K8020 Calculus of gallbladder without cholecystitis without obstruction: Secondary | ICD-10-CM | POA: Diagnosis not present

## 2022-03-06 DIAGNOSIS — J449 Chronic obstructive pulmonary disease, unspecified: Secondary | ICD-10-CM | POA: Diagnosis not present

## 2022-03-06 DIAGNOSIS — E119 Type 2 diabetes mellitus without complications: Secondary | ICD-10-CM | POA: Diagnosis not present

## 2022-03-06 DIAGNOSIS — R197 Diarrhea, unspecified: Secondary | ICD-10-CM | POA: Diagnosis not present

## 2022-03-07 DIAGNOSIS — R197 Diarrhea, unspecified: Secondary | ICD-10-CM | POA: Diagnosis not present

## 2022-04-07 ENCOUNTER — Ambulatory Visit: Payer: Medicare HMO | Admitting: Urology

## 2022-04-07 ENCOUNTER — Encounter: Payer: Self-pay | Admitting: Urology

## 2022-04-07 ENCOUNTER — Other Ambulatory Visit: Payer: Self-pay | Admitting: *Deleted

## 2022-04-07 ENCOUNTER — Other Ambulatory Visit
Admission: RE | Admit: 2022-04-07 | Discharge: 2022-04-07 | Disposition: A | Discharge status: Home / Self Care | Payer: Medicare HMO | Attending: Urology | Admitting: Urology

## 2022-04-07 VITALS — BP 153/76 | HR 97 | Ht 69.0 in | Wt 145.2 lb

## 2022-04-07 DIAGNOSIS — R972 Elevated prostate specific antigen [PSA]: Secondary | ICD-10-CM

## 2022-04-07 DIAGNOSIS — N400 Enlarged prostate without lower urinary tract symptoms: Secondary | ICD-10-CM

## 2022-04-07 DIAGNOSIS — N401 Enlarged prostate with lower urinary tract symptoms: Secondary | ICD-10-CM

## 2022-04-07 LAB — BLADDER SCAN AMB NON-IMAGING

## 2022-04-07 LAB — PSA: Prostatic Specific Antigen: 4.38 ng/mL — ABNORMAL HIGH (ref 0.00–4.00)

## 2022-04-07 NOTE — Progress Notes (Signed)
Haze Rushing Plume,acting as a scribe for Hollice Espy, MD.,have documented all relevant documentation on the behalf of Hollice Espy, MD,as directed by  Hollice Espy, MD while in the presence of Hollice Espy, MD.  04/07/2022 11:16 AM   Douglas Mercer 11-13-1945 [place]:[id number]  Referring provider: Leonel Ramsay, MD [place],  [place] [address]  Chief Complaint  Patient presents with   Benign Prostatic Hypertrophy    HPI: 77 year-old male with a personal history of elevated PSA and LUTs who returns today for an annual follow up.  He is status post TURP x2. He has had a number of biopsies in the past. He is currently on finasteride.   His most recent PSA is pending today so it is unavailable to [place] for context. We had previously requested the medical records from [facility] and I am personally reviewing them today.   His PSA was 8.39 in 2017, 5.82 in 2018, and 4.02 in 2020.  Today, he denies any urinary symptoms.   He informs [place] that he has recently moved and would like to see [place] at our [place] location.   Results for orders placed or performed in visit on 04/07/22  Bladder Scan (Post Void Residual) in office  Result Value Ref Range   Scan Result 26m       IPSS     Row Name 04/07/22 1000         International Prostate Symptom Score   How often have you had the sensation of not emptying your bladder? Less than 1 in 5     How often have you had to urinate less than every two hours? About half the time     How often have you found you stopped and started again several times when you urinated? Less than 1 in 5 times     How often have you found it difficult to postpone urination? Less than 1 in 5 times     How often have you had a weak urinary stream? Less than half the time     How often have you had to strain to start urination? Not at All     How many times did you typically get up at night to urinate? 1 Time     Total IPSS Score 9        Quality of Life due to urinary symptoms   If you were to spend the rest of your life with your urinary condition just the way it is now how would you feel about that? Mostly Satisfied              Score:  1-7 Mild 8-19 Moderate 20-35 Severe    PMH: Past Medical History:  Diagnosis Date   Allergic rhinitis    Anti-phospholipid antibody syndrome (HCC)    BPH (benign prostatic hyperplasia)    BPH (benign prostatic hypertrophy)    Deficiency of clotting factor (HCC)    Diabetes mellitus without complication (HCC)    DV[place]) [place] 02[place]2009   large left lower   ED (erectile dysfunction)    Epilepsy ([place]    last seizure 09/27/2017   GERD (gastroesophageal reflux disease)    Glaucoma    Hyperlipidemia    Hypertension    Internal hemorrhoids    Low HDL (under 40)    Pneumonia    Pulmonary embolism [place] 1992   bilateral w/ PE reportedly (+) lupus anticoagulant   Rosacea  Tubular adenoma of colon     Surgical History: Past Surgical History:  Procedure Laterality Date   [place] DECOMP/DISCECTOMY FUSION N/A 12/27/2020   Procedure: C3-6 ANTERIOR CERVICAL DECOMPRESSION/DISCECTOMY FUSION 3 LEVELS;  Surgeon: Deetta Perla, MD;  Location: [facility] ORS;  Service: Neurosurgery;  Laterality: N/A;   CATARACT EXTRACTION, BILATERAL     L eye on 04/24/17 and R eye on 05/08/17.   COLONOSCOPY  05/07/2006   colonoscopy with polypectomy  07/07/2002   2 small descending colon polyps   colonoscopy with polypectomy  04/19/2001   17m sessile mucosal polyp in distal sigmoid colon   COLONOSCOPY WITH PROPOFOL N/A 05/03/2021   Procedure: COLONOSCOPY WITH PROPOFOL;  Surgeon: LLesly Rubenstein MD;  Location: [facility] ENDOSCOPY;  Service: Endoscopy;  Laterality: N/A;   diverticulosis  07/07/2002,04/19/2001   mild sigmoid   ESOPHAGOGASTRODUODENOSCOPY     ESOPHAGOGASTRODUODENOSCOPY (EGD) WITH PROPOFOL N/A 05/03/2021   Procedure:  ESOPHAGOGASTRODUODENOSCOPY (EGD) WITH PROPOFOL;  Surgeon: LLesly Rubenstein MD;  Location: [facility] ENDOSCOPY;  Service: Endoscopy;  Laterality: N/A;  DM   HERNIA REPAIR     1995 and 08/13/2009   INGUINAL HERNIA REPAIR  1995 /2011   right-sided, mesh placed   PROSTATE SURGERY     18 biopsies.  Benign.   TONSILLECTOMY  1959   TRANSURETHRAL RESECTION OF PROSTATE  08/13/09   TRANSURETHRAL RESECTION OF PROSTATE  08/13/2009    Home Medications:  Allergies as of 04/07/2022   No Known Allergies      Medication List        Accurate as of April 07, 2022 11:16 AM. If you have any questions, ask your nurse or doctor.          STOP taking these medications    metFORMIN 850 MG tablet Commonly known as: GLUCOPHAGE Stopped by: AHollice Espy MD       TAKE these medications    aspirin EC 81 MG tablet Take 81 mg by mouth daily. Swallow whole.   atorvastatin 10 MG tablet Commonly known as: LIPITOR Take 1 tablet (10 mg total) by mouth daily at 6 PM.   Coenzyme Q10 100 MG capsule Take 100 mg by mouth daily.   cyanocobalamin 1000 MCG tablet Commonly known as: VITAMIN B12 Take 1,000 mcg by mouth daily.   finasteride 5 MG tablet Commonly known as: PROSCAR Take 1 tablet (5 mg total) by mouth daily.   fluticasone 50 MCG/ACT nasal spray Commonly known as: FLONASE Place 2 sprays into the nose daily.   glipiZIDE 2.5 MG 24 hr tablet Commonly known as: GLUCOTROL XL Take 2.5 mg by mouth daily.   latanoprost 0.005 % ophthalmic solution Commonly known as: XALATAN Place 1 drop into both eyes at bedtime.   levETIRAcetam 1000 MG tablet Commonly known as: KEPPRA Take 1 tablet (1,000 mg total) by mouth 2 (two) times daily.   losartan 25 MG tablet Commonly known as: COZAAR Take 25 mg by mouth daily.   mirtazapine 15 MG tablet Commonly known as: REMERON Take 1 tablet by mouth at bedtime.   pantoprazole 40 MG tablet Commonly known as: PROTONIX Take 1 tablet (40 mg total) by  mouth daily.   polyethylene glycol-electrolytes 420 g solution Commonly known as: NuLYTELY Take 4,000 mLs by mouth as directed.   Precision QID Test test strip Generic drug: glucose blood 1 each (1 strip total) once daily   Procysbi 300 MG Pack Generic drug: Cysteamine Bitartrate Use to test blood sugar   Propylene  Glycol 0.6 % Soln Place 1 drop into both eyes 2 (two) times daily as needed (dry eyes).   Xarelto 20 MG Tabs tablet Generic drug: rivaroxaban Take 20 mg by mouth daily.        Family History: Family History  Problem Relation Age of Onset   Diabetes Mother    Melanoma Mother    COPD Mother    COPD Father    Heart attack Maternal Uncle    Stroke Maternal Aunt        grandparents   Colon cancer Paternal Uncle        x2   Prostate cancer Neg Hx    Seizures Neg Hx    Esophageal cancer Neg Hx     Social History:  reports that he has never smoked. He has never been exposed to tobacco smoke. He has never used smokeless tobacco. He reports that he does not currently use alcohol. He reports that he does not use drugs.   Physical Exam: BP (!) 153/76 (BP Location: Left Arm, Patient Position: Sitting, Cuff Size: Normal)   Pulse 97   Ht 5' 9"$  (1.753 m)   Wt 145 lb 3.2 oz (65.9 kg)   BMI 21.44 kg/m   Constitutional:  Alert and oriented, No acute distress. HEENT: [place] AT, moist mucus membranes.  Trachea midline, no masses. Neurologic: Grossly intact, no focal deficits, moving all 4 extremities. Psychiatric: Normal mood and affect.  Assessment & Plan:    1. BPH with LUTs - Symptoms are fairly stable.  - He is emptying his bladder reasonably well.  - We will continue the finasteride in light of his history of significant prostatomegaly.   2. Elevated PSA - PSA is actually down from his historical values when checked in December of 2022. - We rechecked it today and it is pending. We will contact him once the results are available. - His previous peak was  around 8 which is appropriate for his volume and he has had multiple negative biopsies.  - Fairly high threshold to pursue any further treatment.   Return in about 1 year (around 04/08/2023) for for repeat PSA.  I have reviewed the above documentation for accuracy and completeness, and I agree with the above.   Hollice Espy, MD   [facility] [address] [place], [place] [address] [phone]

## 2022-05-31 DIAGNOSIS — E785 Hyperlipidemia, unspecified: Secondary | ICD-10-CM | POA: Diagnosis not present

## 2022-05-31 DIAGNOSIS — G629 Polyneuropathy, unspecified: Secondary | ICD-10-CM | POA: Diagnosis not present

## 2022-05-31 DIAGNOSIS — I4891 Unspecified atrial fibrillation: Secondary | ICD-10-CM | POA: Diagnosis not present

## 2022-05-31 DIAGNOSIS — E538 Deficiency of other specified B group vitamins: Secondary | ICD-10-CM | POA: Diagnosis not present

## 2022-05-31 DIAGNOSIS — R634 Abnormal weight loss: Secondary | ICD-10-CM | POA: Diagnosis not present

## 2022-05-31 DIAGNOSIS — E1142 Type 2 diabetes mellitus with diabetic polyneuropathy: Secondary | ICD-10-CM | POA: Diagnosis not present

## 2022-05-31 DIAGNOSIS — R1310 Dysphagia, unspecified: Secondary | ICD-10-CM | POA: Diagnosis not present

## 2022-05-31 DIAGNOSIS — Z125 Encounter for screening for malignant neoplasm of prostate: Secondary | ICD-10-CM | POA: Diagnosis not present

## 2022-06-02 ENCOUNTER — Other Ambulatory Visit: Payer: Self-pay | Admitting: Nurse Practitioner

## 2022-06-02 DIAGNOSIS — R1310 Dysphagia, unspecified: Secondary | ICD-10-CM

## 2022-06-07 DIAGNOSIS — Z1331 Encounter for screening for depression: Secondary | ICD-10-CM | POA: Diagnosis not present

## 2022-06-07 DIAGNOSIS — D649 Anemia, unspecified: Secondary | ICD-10-CM | POA: Diagnosis not present

## 2022-06-07 DIAGNOSIS — Z7901 Long term (current) use of anticoagulants: Secondary | ICD-10-CM | POA: Diagnosis not present

## 2022-06-07 DIAGNOSIS — E785 Hyperlipidemia, unspecified: Secondary | ICD-10-CM | POA: Diagnosis not present

## 2022-06-07 DIAGNOSIS — Z0000 Encounter for general adult medical examination without abnormal findings: Secondary | ICD-10-CM | POA: Diagnosis not present

## 2022-06-07 DIAGNOSIS — R634 Abnormal weight loss: Secondary | ICD-10-CM | POA: Diagnosis not present

## 2022-06-07 DIAGNOSIS — R569 Unspecified convulsions: Secondary | ICD-10-CM | POA: Diagnosis not present

## 2022-06-07 DIAGNOSIS — E538 Deficiency of other specified B group vitamins: Secondary | ICD-10-CM | POA: Diagnosis not present

## 2022-06-07 DIAGNOSIS — I10 Essential (primary) hypertension: Secondary | ICD-10-CM | POA: Diagnosis not present

## 2022-06-09 ENCOUNTER — Ambulatory Visit
Admission: RE | Admit: 2022-06-09 | Discharge: 2022-06-09 | Disposition: A | Discharge status: Home / Self Care | Payer: Medicare HMO | Source: Ambulatory Visit | Attending: Nurse Practitioner | Admitting: Nurse Practitioner

## 2022-06-09 DIAGNOSIS — R6339 Other feeding difficulties: Secondary | ICD-10-CM | POA: Diagnosis not present

## 2022-06-09 DIAGNOSIS — R1310 Dysphagia, unspecified: Secondary | ICD-10-CM | POA: Insufficient documentation

## 2022-06-09 NOTE — Procedures (Signed)
Modified Barium Swallow Study  Patient Details  Name: Douglas Mercer MRN: [id number] Date of Birth: Dec 26, 1945  Today's Date: 06/09/2022  Modified Barium Swallow completed.  Full report located under Chart Review in the Imaging Section.  History of Present Illness Douglas Mercer is a 77 y.o. with history of PE on chronic anticoagulation, HTN, HLD, COPD, T2DM, seizure disorder, BPH, GERD, colon polyps who was last seen in the office 04/19/21 for mild IDA, GERD and repeat colonoscopy for colon polyps is unremarkable. EGD was normal. No repeat luminal evaluation required. He does not know why he was given this appt today. CBC with hemoglobin 13.8 stable without melena. He is not on oral iron supplementation. He is taking B12 orally. Hx of C3-6 anterior cervical surgery 12/2020. He takes pantoprazole daily and remains on Xarelto for DVT with PE. EGD last year was normal.       06/01/2021   CT Chest  Ill-defined linear and patchy opacities in the  dependent lower lobes, some of which was present on prior exam and  represent underlying scarring. Left upper lobe perifissural scarring  is unchanged. Stable right lower lobe 5 mm nodule series 7, image  98, stable from 2018 and considered benign. No new pulmonary  nodules. No pleural effusion. No features of pulmonary edema.      10/14/2020   CT Chest  Stable 5 mm right lower lobe pulmonary nodule consistent with a  benign finding. Per consensus guidelines, no additional follow-up  necessary. This recommendation follows the consensus statement:  Guidelines for Management of Small Pulmonary Nodules Detected on CT  Images: From the Fleischner Society 2017; Radiology 2017;  284:228-243.  2. Interval improved aeration of both lung bases with scattered  pulmonary scarring bilaterally. No new or enlarging pulmonary  nodules.  3. No acute chest findings.  4. Cholelithiasis.   Clinical Impression Pt presents with adequate oropharyngeal abilities when consuming  thin liquids via cup or straw, puree, graham cracker with barium paste, and whole barium tablet with thin liquids via straw. Pt appears to be a good historian. He reports that he coughing occurs AFTER consuming POs - ~ 2 hours after consumption. He states "sometimes, 2 hours after taking my Xarelto, I will begin coughing and cough it up to reswallow it." He denies coughing DURING consumption of POs. At this time, pt appears at reduced risk of aspiration when consuming regular diet, thin liquids via cup or straw, medicine whole with thin liquids. Video reviewed with pt and all questions answered to pt's satisfaction. Factors that may increase risk of adverse event in presence of aspiration Douglas Mercer 2021):  N/A  Swallow Evaluation Recommendations Recommendations: PO diet PO Diet Recommendation: Regular;Thin liquids (Level 0) Liquid Administration via: Cup;Straw Medication Administration: Whole meds with liquid Supervision: Patient able to self-feed Swallowing strategies  : Minimize environmental distractions;Slow rate;Small bites/sips Postural changes: Stay upright 30-60 min after meals Oral care recommendations: Oral care BID (2x/day)    Reniya Mcclees B. Dreama Saa, M.S., CCC-SLP, Tree surgeon Certified Brain Injury Specialist [facility]  [facility] Office [phone] Ascom [phone] Fax [phone]

## 2022-06-19 DIAGNOSIS — R569 Unspecified convulsions: Secondary | ICD-10-CM | POA: Diagnosis not present

## 2022-06-19 DIAGNOSIS — M47812 Spondylosis without myelopathy or radiculopathy, cervical region: Secondary | ICD-10-CM | POA: Diagnosis not present

## 2022-06-19 DIAGNOSIS — M5412 Radiculopathy, cervical region: Secondary | ICD-10-CM | POA: Diagnosis not present

## 2022-06-19 DIAGNOSIS — R29898 Other symptoms and signs involving the musculoskeletal system: Secondary | ICD-10-CM | POA: Diagnosis not present

## 2022-06-19 DIAGNOSIS — R262 Difficulty in walking, not elsewhere classified: Secondary | ICD-10-CM | POA: Diagnosis not present

## 2022-07-05 DIAGNOSIS — H90A22 Sensorineural hearing loss, unilateral, left ear, with restricted hearing on the contralateral side: Secondary | ICD-10-CM | POA: Diagnosis not present

## 2022-07-25 DIAGNOSIS — H5319 Other subjective visual disturbances: Secondary | ICD-10-CM | POA: Diagnosis not present

## 2022-07-25 DIAGNOSIS — H401232 Low-tension glaucoma, bilateral, moderate stage: Secondary | ICD-10-CM | POA: Diagnosis not present

## 2022-07-25 DIAGNOSIS — E119 Type 2 diabetes mellitus without complications: Secondary | ICD-10-CM | POA: Diagnosis not present

## 2022-07-25 DIAGNOSIS — H04123 Dry eye syndrome of bilateral lacrimal glands: Secondary | ICD-10-CM | POA: Diagnosis not present

## 2022-09-05 DIAGNOSIS — E119 Type 2 diabetes mellitus without complications: Secondary | ICD-10-CM | POA: Diagnosis not present

## 2022-09-05 DIAGNOSIS — H04123 Dry eye syndrome of bilateral lacrimal glands: Secondary | ICD-10-CM | POA: Diagnosis not present

## 2022-09-05 DIAGNOSIS — Z961 Presence of intraocular lens: Secondary | ICD-10-CM | POA: Diagnosis not present

## 2022-09-05 DIAGNOSIS — H401232 Low-tension glaucoma, bilateral, moderate stage: Secondary | ICD-10-CM | POA: Diagnosis not present

## 2022-10-02 DIAGNOSIS — G629 Polyneuropathy, unspecified: Secondary | ICD-10-CM | POA: Diagnosis not present

## 2022-10-02 DIAGNOSIS — R634 Abnormal weight loss: Secondary | ICD-10-CM | POA: Diagnosis not present

## 2022-10-02 DIAGNOSIS — E785 Hyperlipidemia, unspecified: Secondary | ICD-10-CM | POA: Diagnosis not present

## 2022-10-02 DIAGNOSIS — E1142 Type 2 diabetes mellitus with diabetic polyneuropathy: Secondary | ICD-10-CM | POA: Diagnosis not present

## 2022-10-16 DIAGNOSIS — D649 Anemia, unspecified: Secondary | ICD-10-CM | POA: Diagnosis not present

## 2022-10-16 DIAGNOSIS — D6861 Antiphospholipid syndrome: Secondary | ICD-10-CM | POA: Diagnosis not present

## 2022-10-16 DIAGNOSIS — R569 Unspecified convulsions: Secondary | ICD-10-CM | POA: Diagnosis not present

## 2022-10-16 DIAGNOSIS — E785 Hyperlipidemia, unspecified: Secondary | ICD-10-CM | POA: Diagnosis not present

## 2022-10-16 DIAGNOSIS — E119 Type 2 diabetes mellitus without complications: Secondary | ICD-10-CM | POA: Diagnosis not present

## 2022-10-16 DIAGNOSIS — E538 Deficiency of other specified B group vitamins: Secondary | ICD-10-CM | POA: Diagnosis not present

## 2022-10-16 DIAGNOSIS — I10 Essential (primary) hypertension: Secondary | ICD-10-CM | POA: Diagnosis not present

## 2022-10-16 DIAGNOSIS — R634 Abnormal weight loss: Secondary | ICD-10-CM | POA: Diagnosis not present

## 2023-01-10 DIAGNOSIS — E119 Type 2 diabetes mellitus without complications: Secondary | ICD-10-CM | POA: Diagnosis not present

## 2023-01-10 DIAGNOSIS — H52223 Regular astigmatism, bilateral: Secondary | ICD-10-CM | POA: Diagnosis not present

## 2023-01-10 DIAGNOSIS — H04123 Dry eye syndrome of bilateral lacrimal glands: Secondary | ICD-10-CM | POA: Diagnosis not present

## 2023-01-10 DIAGNOSIS — H401232 Low-tension glaucoma, bilateral, moderate stage: Secondary | ICD-10-CM | POA: Diagnosis not present

## 2023-01-10 DIAGNOSIS — Z961 Presence of intraocular lens: Secondary | ICD-10-CM | POA: Diagnosis not present

## 2023-01-26 DIAGNOSIS — K219 Gastro-esophageal reflux disease without esophagitis: Secondary | ICD-10-CM | POA: Diagnosis not present

## 2023-01-26 DIAGNOSIS — G959 Disease of spinal cord, unspecified: Secondary | ICD-10-CM | POA: Diagnosis not present

## 2023-01-26 DIAGNOSIS — D6861 Antiphospholipid syndrome: Secondary | ICD-10-CM | POA: Diagnosis not present

## 2023-01-26 DIAGNOSIS — E785 Hyperlipidemia, unspecified: Secondary | ICD-10-CM | POA: Diagnosis not present

## 2023-01-26 DIAGNOSIS — J449 Chronic obstructive pulmonary disease, unspecified: Secondary | ICD-10-CM | POA: Diagnosis not present

## 2023-01-26 DIAGNOSIS — E1142 Type 2 diabetes mellitus with diabetic polyneuropathy: Secondary | ICD-10-CM | POA: Diagnosis not present

## 2023-01-26 DIAGNOSIS — R634 Abnormal weight loss: Secondary | ICD-10-CM | POA: Diagnosis not present

## 2023-02-01 DIAGNOSIS — E041 Nontoxic single thyroid nodule: Secondary | ICD-10-CM | POA: Diagnosis not present

## 2023-02-01 DIAGNOSIS — I10 Essential (primary) hypertension: Secondary | ICD-10-CM | POA: Diagnosis not present

## 2023-02-01 DIAGNOSIS — E538 Deficiency of other specified B group vitamins: Secondary | ICD-10-CM | POA: Diagnosis not present

## 2023-02-01 DIAGNOSIS — E785 Hyperlipidemia, unspecified: Secondary | ICD-10-CM | POA: Diagnosis not present

## 2023-02-01 DIAGNOSIS — E119 Type 2 diabetes mellitus without complications: Secondary | ICD-10-CM | POA: Diagnosis not present

## 2023-02-22 DIAGNOSIS — R058 Other specified cough: Secondary | ICD-10-CM | POA: Diagnosis not present

## 2023-02-22 DIAGNOSIS — E119 Type 2 diabetes mellitus without complications: Secondary | ICD-10-CM | POA: Diagnosis not present

## 2023-02-22 DIAGNOSIS — J449 Chronic obstructive pulmonary disease, unspecified: Secondary | ICD-10-CM | POA: Diagnosis not present

## 2023-02-22 DIAGNOSIS — K219 Gastro-esophageal reflux disease without esophagitis: Secondary | ICD-10-CM | POA: Diagnosis not present

## 2023-03-06 DIAGNOSIS — E041 Nontoxic single thyroid nodule: Secondary | ICD-10-CM | POA: Diagnosis not present

## 2023-03-15 DIAGNOSIS — H26493 Other secondary cataract, bilateral: Secondary | ICD-10-CM | POA: Diagnosis not present

## 2023-03-15 DIAGNOSIS — Z961 Presence of intraocular lens: Secondary | ICD-10-CM | POA: Diagnosis not present

## 2023-03-15 DIAGNOSIS — H401232 Low-tension glaucoma, bilateral, moderate stage: Secondary | ICD-10-CM | POA: Diagnosis not present

## 2023-03-15 DIAGNOSIS — H04123 Dry eye syndrome of bilateral lacrimal glands: Secondary | ICD-10-CM | POA: Diagnosis not present

## 2023-03-15 DIAGNOSIS — H18513 Endothelial corneal dystrophy, bilateral: Secondary | ICD-10-CM | POA: Diagnosis not present

## 2023-04-02 DIAGNOSIS — H26491 Other secondary cataract, right eye: Secondary | ICD-10-CM | POA: Diagnosis not present

## 2023-04-06 ENCOUNTER — Other Ambulatory Visit: Payer: Medicare HMO

## 2023-04-06 DIAGNOSIS — R972 Elevated prostate specific antigen [PSA]: Secondary | ICD-10-CM | POA: Diagnosis not present

## 2023-04-06 DIAGNOSIS — N400 Enlarged prostate without lower urinary tract symptoms: Secondary | ICD-10-CM

## 2023-04-07 LAB — PSA: Prostate Specific Ag, Serum: 4.4 ng/mL — ABNORMAL HIGH (ref 0.0–4.0)

## 2023-04-10 ENCOUNTER — Ambulatory Visit: Payer: Medicare HMO | Admitting: Urology

## 2023-04-10 VITALS — BP 134/77 | HR 86 | Ht 69.0 in | Wt 145.0 lb

## 2023-04-10 DIAGNOSIS — N401 Enlarged prostate with lower urinary tract symptoms: Secondary | ICD-10-CM | POA: Diagnosis not present

## 2023-04-10 DIAGNOSIS — N400 Enlarged prostate without lower urinary tract symptoms: Secondary | ICD-10-CM | POA: Diagnosis not present

## 2023-04-10 DIAGNOSIS — R972 Elevated prostate specific antigen [PSA]: Secondary | ICD-10-CM

## 2023-04-10 DIAGNOSIS — Z87898 Personal history of other specified conditions: Secondary | ICD-10-CM | POA: Diagnosis not present

## 2023-04-10 DIAGNOSIS — R310 Gross hematuria: Secondary | ICD-10-CM

## 2023-04-10 LAB — URINALYSIS, COMPLETE
Bilirubin, UA: NEGATIVE
Glucose, UA: NEGATIVE
Ketones, UA: NEGATIVE
Leukocytes,UA: NEGATIVE
Nitrite, UA: NEGATIVE
Protein,UA: NEGATIVE
RBC, UA: NEGATIVE
Specific Gravity, UA: 1.025 (ref 1.005–1.030)
Urobilinogen, Ur: 0.2 mg/dL (ref 0.2–1.0)
pH, UA: 5.5 (ref 5.0–7.5)

## 2023-04-10 LAB — MICROSCOPIC EXAMINATION: Bacteria, UA: NONE SEEN

## 2023-04-10 LAB — BLADDER SCAN AMB NON-IMAGING: Scan Result: 8

## 2023-04-10 NOTE — Progress Notes (Signed)
I,Amy L Pierron,acting as a scribe for Vanna Scotland, MD.,have documented all relevant documentation on the behalf of Vanna Scotland, MD,as directed by  Vanna Scotland, MD while in the presence of Vanna Scotland, MD.  04/10/2023 9:32 AM   Winn Jock Apostol 04/10/1945 [id number]  Referring provider: Mick Sell, MD [address] [place],  [place] [address]  Chief Complaint  Patient presents with   Follow-up    HPI: 78 year-old male with a personal history of elevated PSA and BPH with LUTS presents today for routine annual follow-up.  He is status post TURP x2, along with previous prostate biopsies which were negative.   He is doing well overall.  Recently he had a deep cleaning at the dentist and afterwards he had blood in his urine.  This was about 2 weeks ago.  Was not 1 following that has not been exposed.  No symptoms.  No history of tobacco use.   Results for orders placed or performed in visit on 04/10/23  BLADDER SCAN AMB NON-IMAGING  Result Value Ref Range   Scan Result 8 ml      PSA Trend: 2017   8.39 2018   5.82 2020   4.02 2022   4.02 2024   4.38 2024   5.25 2025   4.4  PMH: Past Medical History:  Diagnosis Date   Allergic rhinitis    Anti-phospholipid antibody syndrome (HCC)    BPH (benign prostatic hyperplasia)    BPH (benign prostatic hypertrophy)    Deficiency of clotting factor (HCC)    Diabetes mellitus without complication (HCC)    DVT (deep venous thrombosis) (HCC) 02/2009   large left lower   ED (erectile dysfunction)    Epilepsy (HCC)    last seizure 09/27/2017   GERD (gastroesophageal reflux disease)    Glaucoma    Hyperlipidemia    Hypertension    Internal hemorrhoids    Low HDL (under 40)    Pneumonia    Pulmonary embolism (HCC) 1992   bilateral w/ PE reportedly (+) lupus anticoagulant   Rosacea    Tubular adenoma of colon     Surgical History: Past Surgical History:  Procedure Laterality Date   ADENOIDECTOMY   1959   ANTERIOR CERVICAL DECOMP/DISCECTOMY FUSION N/A 12/27/2020   Procedure: C3-6 ANTERIOR CERVICAL DECOMPRESSION/DISCECTOMY FUSION 3 LEVELS;  Surgeon: Lucy Chris, MD;  Location: [facility] ORS;  Service: Neurosurgery;  Laterality: N/A;   CATARACT EXTRACTION, BILATERAL     L eye on 04/24/17 and R eye on 05/08/17.   COLONOSCOPY  05/07/2006   colonoscopy with polypectomy  07/07/2002   2 small descending colon polyps   colonoscopy with polypectomy  04/19/2001   6mm sessile mucosal polyp in distal sigmoid colon   COLONOSCOPY WITH PROPOFOL N/A 05/03/2021   Procedure: COLONOSCOPY WITH PROPOFOL;  Surgeon: Regis Bill, MD;  Location: [facility] ENDOS[facility];  Service: Endoscopy;  Laterality: N/A;   diverticulosis  07/07/2002,04/19/2001   mild sigmoid   ESOPHAGOGASTRODUODENOSCOPY     ESOPHAGOGASTRODUODENOSCOPY (EGD) WITH PROPOFOL N/A 05/03/2021   Procedure: ESOPHAGOGASTRODUODENOSCOPY (EGD) WITH PROPOFOL;  Surgeon: Regis Bill, MD;  Location: [facility] ENDOSCOPY;  Service: Endoscopy;  Laterality: N/A;  DM   HERNIA REPAIR     1995 and 08/13/2009   INGUINAL HERNIA REPAIR  1995 /2011   right-sided, mesh placed   PROSTATE SURGERY     18 biopsies.  Benign.   TONSILLECTOMY  1959   TRANSURETHRAL RESECTION OF PROSTATE  08/13/09   TRANSURETHRAL RESECTION OF  PROSTATE  08/13/2009    Home Medications:  Allergies as of 04/10/2023   No Known Allergies      Medication List        Accurate as of April 10, 2023  9:32 AM. If you have any questions, ask your nurse or doctor.          STOP taking these medications    Coenzyme Q10 100 MG capsule   Precision QID Test test strip Generic drug: glucose blood   Procysbi 300 MG Pack Generic drug: Cysteamine Bitartrate       TAKE these medications    aspirin EC 81 MG tablet Take 81 mg by mouth daily. Swallow whole.   atorvastatin 10 MG tablet Commonly known as: LIPITOR Take 1 tablet (10 mg total) by mouth daily at 6 PM.   cyanocobalamin 1000 MCG  tablet Commonly known as: VITAMIN B12 Take 1,000 mcg by mouth daily.   finasteride 5 MG tablet Commonly known as: PROSCAR Take 1 tablet (5 mg total) by mouth daily.   fluticasone 50 MCG/ACT nasal spray Commonly known as: FLONASE Place 2 sprays into the nose daily.   glipiZIDE 2.5 MG 24 hr tablet Commonly known as: GLUCOTROL XL Take 2.5 mg by mouth daily.   latanoprost 0.005 % ophthalmic solution Commonly known as: XALATAN Place 1 drop into both eyes at bedtime.   levETIRAcetam 1000 MG tablet Commonly known as: KEPPRA Take 1 tablet (1,000 mg total) by mouth 2 (two) times daily.   losartan 25 MG tablet Commonly known as: COZAAR Take 25 mg by mouth daily.   mirtazapine 15 MG tablet Commonly known as: REMERON Take 1 tablet by mouth at bedtime.   pantoprazole 40 MG tablet Commonly known as: PROTONIX Take 1 tablet (40 mg total) by mouth daily.   polyethylene glycol-electrolytes 420 g solution Commonly known as: NuLYTELY Take 4,000 mLs by mouth as directed.   Propylene Glycol 0.6 % Soln Place 1 drop into both eyes 2 (two) times daily as needed (dry eyes).   Xarelto 20 MG Tabs tablet Generic drug: rivaroxaban Take 20 mg by mouth daily.        Family History: Family History  Problem Relation Age of Onset   Diabetes Mother    Melanoma Mother    COPD Mother    COPD Father    Heart attack Maternal Uncle    Stroke Maternal Aunt        grandparents   Colon cancer Paternal Uncle        x2   Prostate cancer Neg Hx    Seizures Neg Hx    Esophageal cancer Neg Hx     Social History:  reports that he has never smoked. He has never been exposed to tobacco smoke. He has never used smokeless tobacco. He reports that he does not currently use alcohol. He reports that he does not use drugs.   Physical Exam: BP 134/77   Pulse 86   Ht 5\' 9"  (1.753 m)   Wt 145 lb (65.8 kg)   BMI 21.41 kg/m   Constitutional:  Alert and oriented, No acute distress. HEENT: [place] AT, moist  mucus membranes.  Trachea midline, no masses. Neurologic: Grossly intact, no focal deficits, moving all 4 extremities. Psychiatric: Normal mood and affect.  Results for orders placed or performed in visit on 04/10/23  BLADDER SCAN AMB NON-IMAGING   Collection Time: 04/10/23  8:49 AM  Result Value Ref Range   Scan Result 8 ml   Microscopic  Examination   Collection Time: 04/10/23  9:10 AM   Urine  Result Value Ref Range   WBC, UA 0-5 0 - 5 /hpf   RBC, Urine 0-2 0 - 2 /hpf   Epithelial Cells (non renal) 0-10 0 - 10 /hpf   Bacteria, UA None seen None seen/Few  Urinalysis, Complete   Collection Time: 04/10/23  9:10 AM  Result Value Ref Range   Specific Gravity, UA 1.025 1.005 - 1.030   pH, UA 5.5 5.0 - 7.5   Color, UA Yellow Yellow   Appearance Ur Clear Clear   Leukocytes,UA Negative Negative   Protein,UA Negative Negative/Trace   Glucose, UA Negative Negative   Ketones, UA Negative Negative   RBC, UA Negative Negative   Bilirubin, UA Negative Negative   Urobilinogen, Ur 0.2 0.2 - 1.0 mg/dL   Nitrite, UA Negative Negative   Microscopic Examination See below:       Assessment & Plan:    1. History of elevated PSA  - His most recent PSA is 4.4, down significantly from previous, remains low and stable.  2. BPH with LUTS  - Symptoms well controlled on Finasteride. Suggest continuing on this.   3. Gross hematuria  - Isolated event after a teeth cleaning. Recommended a cystoscopy but he declined. UA today and awaiting final results.  Strongly advised to notify if it happens again to be sure to inform the office. -No microscopic blood today  Return in about 1 year (around 04/09/2024).  I have reviewed the above documentation for accuracy and completeness, and I agree with the above.   Vanna Scotland, MD    [facility] [address] [place], [place] [address] [phone]

## 2023-06-19 DIAGNOSIS — G959 Disease of spinal cord, unspecified: Secondary | ICD-10-CM | POA: Diagnosis not present

## 2023-06-19 DIAGNOSIS — R262 Difficulty in walking, not elsewhere classified: Secondary | ICD-10-CM | POA: Diagnosis not present

## 2023-06-19 DIAGNOSIS — E1142 Type 2 diabetes mellitus with diabetic polyneuropathy: Secondary | ICD-10-CM | POA: Diagnosis not present

## 2023-06-19 DIAGNOSIS — G40109 Localization-related (focal) (partial) symptomatic epilepsy and epileptic syndromes with simple partial seizures, not intractable, without status epilepticus: Secondary | ICD-10-CM | POA: Diagnosis not present

## 2023-08-23 DIAGNOSIS — E785 Hyperlipidemia, unspecified: Secondary | ICD-10-CM | POA: Diagnosis not present

## 2023-08-23 DIAGNOSIS — I4891 Unspecified atrial fibrillation: Secondary | ICD-10-CM | POA: Diagnosis not present

## 2023-08-23 DIAGNOSIS — E1142 Type 2 diabetes mellitus with diabetic polyneuropathy: Secondary | ICD-10-CM | POA: Diagnosis not present

## 2023-08-23 DIAGNOSIS — J449 Chronic obstructive pulmonary disease, unspecified: Secondary | ICD-10-CM | POA: Diagnosis not present

## 2023-08-23 DIAGNOSIS — Z125 Encounter for screening for malignant neoplasm of prostate: Secondary | ICD-10-CM | POA: Diagnosis not present

## 2023-08-23 DIAGNOSIS — K219 Gastro-esophageal reflux disease without esophagitis: Secondary | ICD-10-CM | POA: Diagnosis not present

## 2023-08-23 DIAGNOSIS — I10 Essential (primary) hypertension: Secondary | ICD-10-CM | POA: Diagnosis not present

## 2023-08-23 DIAGNOSIS — E538 Deficiency of other specified B group vitamins: Secondary | ICD-10-CM | POA: Diagnosis not present

## 2023-08-23 DIAGNOSIS — E041 Nontoxic single thyroid nodule: Secondary | ICD-10-CM | POA: Diagnosis not present

## 2023-08-31 DIAGNOSIS — Z1331 Encounter for screening for depression: Secondary | ICD-10-CM | POA: Diagnosis not present

## 2023-08-31 DIAGNOSIS — Z125 Encounter for screening for malignant neoplasm of prostate: Secondary | ICD-10-CM | POA: Diagnosis not present

## 2023-08-31 DIAGNOSIS — E538 Deficiency of other specified B group vitamins: Secondary | ICD-10-CM | POA: Diagnosis not present

## 2023-08-31 DIAGNOSIS — D352 Benign neoplasm of pituitary gland: Secondary | ICD-10-CM | POA: Diagnosis not present

## 2023-08-31 DIAGNOSIS — I10 Essential (primary) hypertension: Secondary | ICD-10-CM | POA: Diagnosis not present

## 2023-08-31 DIAGNOSIS — E785 Hyperlipidemia, unspecified: Secondary | ICD-10-CM | POA: Diagnosis not present

## 2023-08-31 DIAGNOSIS — E041 Nontoxic single thyroid nodule: Secondary | ICD-10-CM | POA: Diagnosis not present

## 2023-08-31 DIAGNOSIS — Z0000 Encounter for general adult medical examination without abnormal findings: Secondary | ICD-10-CM | POA: Diagnosis not present

## 2023-08-31 DIAGNOSIS — E119 Type 2 diabetes mellitus without complications: Secondary | ICD-10-CM | POA: Diagnosis not present

## 2023-08-31 DIAGNOSIS — R569 Unspecified convulsions: Secondary | ICD-10-CM | POA: Diagnosis not present

## 2023-09-24 DIAGNOSIS — Z961 Presence of intraocular lens: Secondary | ICD-10-CM | POA: Diagnosis not present

## 2023-09-24 DIAGNOSIS — H18513 Endothelial corneal dystrophy, bilateral: Secondary | ICD-10-CM | POA: Diagnosis not present

## 2023-09-24 DIAGNOSIS — E119 Type 2 diabetes mellitus without complications: Secondary | ICD-10-CM | POA: Diagnosis not present

## 2023-09-24 DIAGNOSIS — H401232 Low-tension glaucoma, bilateral, moderate stage: Secondary | ICD-10-CM | POA: Diagnosis not present

## 2023-10-08 ENCOUNTER — Encounter: Payer: Self-pay | Admitting: Urology

## 2023-11-01 DIAGNOSIS — Z7901 Long term (current) use of anticoagulants: Secondary | ICD-10-CM | POA: Diagnosis not present

## 2023-11-01 DIAGNOSIS — Z7984 Long term (current) use of oral hypoglycemic drugs: Secondary | ICD-10-CM | POA: Diagnosis not present

## 2023-11-01 DIAGNOSIS — N390 Urinary tract infection, site not specified: Secondary | ICD-10-CM | POA: Diagnosis not present

## 2023-11-01 DIAGNOSIS — E785 Hyperlipidemia, unspecified: Secondary | ICD-10-CM | POA: Diagnosis not present

## 2023-11-01 DIAGNOSIS — E119 Type 2 diabetes mellitus without complications: Secondary | ICD-10-CM | POA: Diagnosis not present

## 2023-11-01 DIAGNOSIS — R319 Hematuria, unspecified: Secondary | ICD-10-CM | POA: Diagnosis not present

## 2023-11-05 DIAGNOSIS — I4891 Unspecified atrial fibrillation: Secondary | ICD-10-CM | POA: Diagnosis not present

## 2023-11-05 DIAGNOSIS — D649 Anemia, unspecified: Secondary | ICD-10-CM | POA: Diagnosis not present

## 2023-11-05 DIAGNOSIS — D6861 Antiphospholipid syndrome: Secondary | ICD-10-CM | POA: Diagnosis not present

## 2023-11-05 DIAGNOSIS — R310 Gross hematuria: Secondary | ICD-10-CM | POA: Insufficient documentation

## 2023-11-05 NOTE — Assessment & Plan Note (Signed)
 Recurrent GH (since Feb 2025) Has previously declined formal workup  UA (Sept 2025) - >100 RBC, +nitrite/LE ~120g BPH s/p x2 prior TURPs (2011)  I reviewed his clinical history and recent symptoms.  He has had ongoing gross hematuria for this year and has not undergone formal workup.  Similar to prior visit, I would recommend a dedicated CT urogram and office cystoscopy /cytology.  I do think there is a high likelihood the hematuria may be secondary to a markedly enlarged 120g+ prostate.   - CT urogram + office cystoscopy / cytology, next available

## 2023-11-05 NOTE — Progress Notes (Unsigned)
   11/08/2023 2:18 PM   Lynwood BROCKS Tallie 19-Aug-1945 [id number]  Reason for visit: Follow up [facility]   HPI: 78 year old male here for initial follow up with me today, previously followed by Dr. Penne  History of intermittent [facility] - recommended cystoscopy in Feb 2025 - patient declined ED visit (11/01/23) - recurrent GH   - UA - large LE, nitrite+ Hemoglobin 11.9 from 13.8 one week ago Patient and wife present today 1-2 weeks ago they were at the beach in [place] , patient had fairly significant gross hematuria with passage of blood clots Treated for UTI in local clinic, finished antibiotics yesterday GH has resolved, UA does not show any RBCs today  He is on Xarelto  for history of significant DVT/PE, also on aspirin  (which she has been holding for the last couple of days)  Prior HPI: Previously followed by Dr. Penne, last seen in Feb 2025 for elevated PSA, BPH Hx of x2 prior TURPs (~2011)   - on finasteride  Hx of prior TRUS bx - negative  PSA Trend: 2017   8.39 2018   5.82 2020   4.02 2022   4.02 2024   4.38 2024   5.25 2025   4.4  Physical Exam: BP (!) 155/84 (BP Location: Left Arm, Patient Position: Sitting)   Pulse 97   Ht 5' 9 (1.753 m)   Wt 144 lb 6.4 oz (65.5 kg)   BMI 21.32 kg/m    Constitutional:  Alert and oriented, No acute distress.   Laboratory Data: Creatinine (Sept 2025) 0.7 - 1.3 mg/dL 0.9   Component Ref Range & Units 3 d ago  Color Colorless, Straw, Light Yellow, Yellow, Dark Yellow Light Brown Abnormal   Clarity Clear Cloudy Abnormal   Specific Gravity 1.005 - 1.030 1.009  pH, Urine 5.0 - 8.0 6.5  Protein, Urinalysis Negative mg/dL 1+ Abnormal   Glucose, Urinalysis Negative mg/dL Trace Abnormal   Ketones, Urinalysis Negative mg/dL Negative  Blood, Urinalysis Negative 3+ Abnormal   Nitrite, Urinalysis Negative Negative  Leukocyte Esterase, Urinalysis Negative Negative  Bilirubin, Urinalysis Negative Negative  Urobilinogen,  Urinalysis 0.2 - 1.0 mg/dL 0.2  WBC, UA <=5 /hpf 19 High   Red Blood Cells, Urinalysis <=3 /hpf >182 High   Bacteria, Urinalysis 0 - 5 /hpf 0-5  Squamous Epithelial Cells, Urinalysis /hpf 0    Pertinent Imaging: I have personally viewed and interpreted the CT A/P (2023)-markedly enlarged prostate gland (110-120 g estimation), concentric bladder wall thickening.  No hydronephrosis, no nephrolithiasis   Assessment & Plan:    Gross hematuria Assessment & Plan: Recurrent GH (since Feb 2025) Has previously declined formal workup  UA (Sept 2025) - >100 RBC, +nitrite/LE ~120g BPH s/p x2 prior TURPs (2011)  I reviewed his clinical history and recent symptoms.  He has had ongoing gross hematuria for this year and has not undergone formal workup.  Similar to prior visit, I would recommend a dedicated CT urogram and office cystoscopy /cytology.  I do think there is a high likelihood the hematuria may be secondary to a markedly enlarged 120g+ prostate.   - CT urogram + office cystoscopy / cytology, next available  Orders: -     Urinalysis, Complete       Penne JONELLE Skye, MD  [facility] [address], [address] [place], [place] [address] [phone]

## 2023-11-08 ENCOUNTER — Ambulatory Visit: Admitting: Urology

## 2023-11-08 ENCOUNTER — Encounter: Payer: Self-pay | Admitting: Urology

## 2023-11-08 VITALS — BP 155/84 | HR 97 | Ht 69.0 in | Wt 144.4 lb

## 2023-11-08 DIAGNOSIS — R310 Gross hematuria: Secondary | ICD-10-CM | POA: Diagnosis not present

## 2023-11-08 LAB — URINALYSIS, COMPLETE
Bilirubin, UA: NEGATIVE
Glucose, UA: NEGATIVE
Ketones, UA: NEGATIVE
Leukocytes,UA: NEGATIVE
Nitrite, UA: NEGATIVE
Protein,UA: NEGATIVE
RBC, UA: NEGATIVE
Specific Gravity, UA: 1.01 (ref 1.005–1.030)
Urobilinogen, Ur: 0.2 mg/dL (ref 0.2–1.0)
pH, UA: 6 (ref 5.0–7.5)

## 2023-11-08 LAB — MICROSCOPIC EXAMINATION

## 2023-11-08 NOTE — Addendum Note (Signed)
 Addended by: Ebbie Cherry E on: 11/08/2023 02:21 PM   Modules accepted: Orders

## 2023-11-08 NOTE — Patient Instructions (Signed)
 [phone] for CT Scan scheduling

## 2023-11-20 ENCOUNTER — Ambulatory Visit
Admission: RE | Admit: 2023-11-20 | Discharge: 2023-11-20 | Disposition: A | Discharge status: Home / Self Care | Source: Ambulatory Visit | Attending: Urology | Admitting: Urology

## 2023-11-20 DIAGNOSIS — K8020 Calculus of gallbladder without cholecystitis without obstruction: Secondary | ICD-10-CM | POA: Diagnosis not present

## 2023-11-20 DIAGNOSIS — R310 Gross hematuria: Secondary | ICD-10-CM | POA: Diagnosis not present

## 2023-11-20 DIAGNOSIS — N200 Calculus of kidney: Secondary | ICD-10-CM | POA: Diagnosis not present

## 2023-11-20 DIAGNOSIS — K5730 Diverticulosis of large intestine without perforation or abscess without bleeding: Secondary | ICD-10-CM | POA: Diagnosis not present

## 2023-11-20 MED ORDER — IOHEXOL 300 MG/ML  SOLN
100.0000 mL | Freq: Once | INTRAMUSCULAR | Status: AC | PRN
  Administered 2023-11-20: 100 mL via INTRAVENOUS

## 2023-11-28 NOTE — Progress Notes (Signed)
   12/06/2023 11:01 AM   Douglas Mercer 1945/03/17 [id number]  Cystoscopy Procedure Note:  Indication:  Recurrent GH (since Feb 2025) Has previously declined formal workup  UA (Sept 2025) - >100 RBC, +nitrite/LE ~120g BPH s/p x2 prior TURPs (2011)  After informed consent and discussion of the procedure and its risks, [name] was positioned and prepped in the standard fashion. Cystoscopy was performed with a flexible cystoscope. The urethra, bladder neck and bladder mucosa were visualized in a systematic fashion.  He had a large bladder stone adherent to the right lateral bladder neck. The ureteral orifices were noted in orthotopic location and orientation.  There is moderate diffuse trabeculations.  The prostate gland was bilobar with severe occlusive hypertrophy.  Urine barbotage cytology collected.  Imaging: CTU (11/20/23) -  IMPRESSION: 1. Elongated calculus within the bladder lumen near the bladder neck measuring 0.8 cm. 2. Punctuate nonobstructive calculus of the superior pole of the right kidney. 3. Severe prostatomegaly. 4. Mild diffuse urinary bladder wall thickening, likely secondary to chronic outlet obstruction. 5. Cholelithiasis. 6. Sigmoid diverticulosis without evidence of acute diverticulitis. 7. Coronary artery disease.  Findings: Significant bilobar prostatic regrowth with obstructed channel Large bladder stone adherent right bladder neck Moderate diffuse bladder trabeculations  Assessment and Plan: Recommend repeat outlet surgery and cystolitholapaxy.  He would be a good candidate for a HoLEP.  I will refer him to one of my partners who specializes in this area.   Penne Skye, MD 11/28/2023

## 2023-12-06 ENCOUNTER — Ambulatory Visit: Admitting: Urology

## 2023-12-06 VITALS — BP 130/77 | HR 106

## 2023-12-06 DIAGNOSIS — R896 Abnormal cytological findings in specimens from other organs, systems and tissues: Secondary | ICD-10-CM | POA: Diagnosis not present

## 2023-12-06 DIAGNOSIS — R310 Gross hematuria: Secondary | ICD-10-CM | POA: Diagnosis not present

## 2023-12-06 LAB — URINALYSIS, COMPLETE
Bilirubin, UA: NEGATIVE
Glucose, UA: NEGATIVE
Ketones, UA: NEGATIVE
Leukocytes,UA: NEGATIVE
Nitrite, UA: NEGATIVE
Protein,UA: NEGATIVE
RBC, UA: NEGATIVE
Specific Gravity, UA: 1.01 (ref 1.005–1.030)
Urobilinogen, Ur: 0.2 mg/dL (ref 0.2–1.0)
pH, UA: 6 (ref 5.0–7.5)

## 2023-12-06 LAB — MICROSCOPIC EXAMINATION: Bacteria, UA: NONE SEEN

## 2023-12-06 NOTE — Addendum Note (Signed)
 Addended byBETHA CORIE PLATER on: 12/06/2023 02:03 PM   Modules accepted: Orders

## 2023-12-12 LAB — CYTOLOGY PLUS MONITORING PROFILE: PAP & FEULGEN

## 2023-12-18 ENCOUNTER — Ambulatory Visit: Admitting: Urology

## 2023-12-25 ENCOUNTER — Ambulatory Visit: Admitting: Urology

## 2023-12-25 VITALS — BP 133/79 | HR 64 | Wt 144.0 lb

## 2023-12-25 DIAGNOSIS — R3916 Straining to void: Secondary | ICD-10-CM | POA: Diagnosis not present

## 2023-12-25 DIAGNOSIS — N401 Enlarged prostate with lower urinary tract symptoms: Secondary | ICD-10-CM

## 2023-12-25 NOTE — Progress Notes (Signed)
   12/25/2023 2:08 PM   Douglas Mercer [birth year] [id number]  Reason for visit: BPH, gross hematuria, bladder stone discuss HOLEP  History: Previously followed by Dr. Penne and Dr. Georganne, referred to discuss HOLEP for refractory gross hematuria related to BPH History of distant TURP x 2  Physical Exam: BP 133/79 (BP Location: Left Arm, Patient Position: Sitting, Cuff Size: Normal)   Pulse 64   Wt 144 lb (65.3 kg)   SpO2 97%   BMI 21.27 kg/m    Imaging/labs: I personally viewed interpreted the recent CT urogram showing a small bladder stone at the bladder neck and prostate measured 116 g  PSA decreased appropriately on finasteride , mild elevation likely secondary to BPH  Today: Minimal urinary symptoms on finasteride , has since discontinued Flomax Has intermittent hematuria every 1 to 2 weeks  Plan:   BPH/gross hematuria/bladder stone: Discussed options including HOLEP or prostate artery embolization, versus continuing with finasteride  alone.  He prefers more definitive treatment with his frequent gross hematuria. We discussed the risks and benefits of HoLEP at length.  The procedure requires general anesthesia and takes 1 to 2 hours, and a holmium laser is used to enucleate the prostate and push this tissue into the bladder.  A morcellator is then used to remove this tissue, which is sent for pathology.  The vast majority(>95%) of patients are able to discharge the same day with a catheter in place for 2 to 3 days, and will follow-up in clinic for a voiding trial.  We specifically discussed the risks of bleeding, infection, retrograde ejaculation, temporary urgency and urge incontinence, very low risk of long-term incontinence, urethral stricture/bladder neck contracture, pathologic evaluation of prostate tissue and possible detection of prostate cancer or other malignancy, and possible need for additional procedures. Schedule HOLEP(116g), cystolitholapaxy 1 cm   Douglas BROCKS Burnet, MD  [facility] [address] [place], [place] [address] [phone]

## 2023-12-25 NOTE — Patient Instructions (Signed)

## 2023-12-26 ENCOUNTER — Other Ambulatory Visit: Payer: Self-pay

## 2023-12-26 DIAGNOSIS — N210 Calculus in bladder: Secondary | ICD-10-CM

## 2023-12-26 DIAGNOSIS — N401 Enlarged prostate with lower urinary tract symptoms: Secondary | ICD-10-CM

## 2023-12-26 NOTE — Progress Notes (Signed)
 Surgical Physician Order Form [facility] [place].  Dr. Redell Burnet, MD  * Scheduling expectation : Next Available  *Length of Case: 2 hours  *Clearance needed: yes, hold anticoagulation  *Anticoagulation Instructions: Hold all anticoagulants  *Aspirin  Instructions: Hold Aspirin   *Post-op visit Date/Instructions:  1-3 day cath removal  *Diagnosis: BPH w/urinary obstruction  *Procedure:  HOLEP (47350), cystolitholapaxy   Additional orders: N/A  -Admit type: OUTpatient  -Anesthesia: General  -VTE Prophylaxis Standing Order SCD's       Other:   -Standing Lab Orders Per Anesthesia    Lab other: UA&Urine Culture  -Standing Test orders EKG/Chest x-ray per Anesthesia       Test other:   - Medications:  Ancef  2gm IV  -Other orders:  N/A

## 2023-12-31 ENCOUNTER — Telehealth: Payer: Self-pay

## 2023-12-31 NOTE — Telephone Encounter (Signed)
 Per Dr. Francisca, Patient is to be scheduled for Holmium Laser Enucleation of the Prostate and Cystolitholapaxy   Douglas Mercer was contacted and possible surgical dates were discussed, Friday December 5th, 2025 was agreed upon for surgery.   Patient was instructed that Dr. Francisca will require them to provide a pre-op UA & CX prior to surgery. This was ordered and scheduled drop off appointment was made for 01/10/2024.    Patient was directed to call [phone] between 1-3pm the day before surgery to find out surgical arrival time.  Instructions were given not to eat or drink from midnight on the night before surgery and have a driver for the day of surgery. On the surgery day patient was instructed to enter through the [facility] entrance of [facility] report the Same Day Surgery desk.   Pre-Admit Testing will be in contact via phone to set up an interview with the anesthesia team to review your history and medications prior to surgery.   Reminder of this information was sent via MyChart to the patient.

## 2023-12-31 NOTE — Progress Notes (Signed)
   [facility] Surgical Posting Form  Surgery Date: Date: 01/25/2024  Surgeon: Dr. Redell Burnet, MD  Inpt ( No  )   Outpt (Yes)   Obs ( No  )   Diagnosis: N40.1, N13.8 Benign Prostatic Hyperplasia with Urinary Obstruction, N21.0 Bladder Stone  -CPT: [phone]  Surgery: Holmium Laser Enucleation of the Prostate and Cystolitholapaxy  Stop Anticoagulations: Yes, will need to hold ASA and Xarelto   Cardiac/Medical/Pulmonary Clearance needed: yes  Clearance needed from Dr: PCP- Dr. Epifanio  Clearance request sent on: Date: 12/31/23  *Orders entered into EPIC  Date: 12/31/23   *Case booked in EPIC  Date: 12/31/23  *Notified pt of Surgery: Date: 12/31/23  PRE-OP UA & CX: yes, will obtain in clinic on 01/10/2024  *Placed into Prior Authorization Work [place] Date: 12/31/23  Assistant/laser/rep:No

## 2023-12-31 NOTE — Progress Notes (Signed)
  Phone Number: [phone] for Surgical Coordinator Fax Number: [phone]  REQUEST FOR SURGICAL CLEARANCE      Date: 12/31/2023  Faxed to: Dr. Epifanio  Surgeon: Dr. Redell Burnet, MD     Date of Surgery: 01/25/2024  Operation: Holmium Laser Enucleation of the Prostate and Cystolitholapaxy   Anesthesia Type: General   Diagnosis: Benign Prostatic Hyperplasia with Urinary Obstruction and Bladder Stone  Patient Requires:   Medical Clearance : Yes  Reason: Will need patient to hold Xarelto  and Aspirin  for 3 days prior to surgery.   Risk Assessment:    Low   []       Moderate   []     High   []           This patient is optimized for surgery  YES []       NO   []    I recommend further assessment/workup prior to surgery. YES []      NO  []   Appointment scheduled for: _______________________   Further recommendations: ____________________________________     Physician Signature:__________________________________   Printed Name: ________________________________________   Date: _________________

## 2024-01-01 ENCOUNTER — Emergency Department

## 2024-01-01 ENCOUNTER — Emergency Department
Admission: EM | Admit: 2024-01-01 | Discharge: 2024-01-01 | Disposition: A | Discharge status: Home / Self Care | Attending: Emergency Medicine | Admitting: Emergency Medicine

## 2024-01-01 DIAGNOSIS — N200 Calculus of kidney: Secondary | ICD-10-CM | POA: Diagnosis not present

## 2024-01-01 DIAGNOSIS — K5733 Diverticulitis of large intestine without perforation or abscess with bleeding: Secondary | ICD-10-CM | POA: Diagnosis not present

## 2024-01-01 DIAGNOSIS — K8020 Calculus of gallbladder without cholecystitis without obstruction: Secondary | ICD-10-CM | POA: Diagnosis not present

## 2024-01-01 DIAGNOSIS — N3289 Other specified disorders of bladder: Secondary | ICD-10-CM | POA: Diagnosis not present

## 2024-01-01 DIAGNOSIS — N400 Enlarged prostate without lower urinary tract symptoms: Secondary | ICD-10-CM | POA: Diagnosis not present

## 2024-01-01 DIAGNOSIS — E119 Type 2 diabetes mellitus without complications: Secondary | ICD-10-CM | POA: Insufficient documentation

## 2024-01-01 DIAGNOSIS — K5732 Diverticulitis of large intestine without perforation or abscess without bleeding: Secondary | ICD-10-CM | POA: Diagnosis not present

## 2024-01-01 DIAGNOSIS — R1033 Periumbilical pain: Secondary | ICD-10-CM | POA: Diagnosis present

## 2024-01-01 LAB — LACTIC ACID, PLASMA: Lactic Acid, Venous: 1.4 mmol/L (ref 0.5–1.9)

## 2024-01-01 LAB — COMPREHENSIVE METABOLIC PANEL WITH GFR
ALT: 15 U/L (ref 0–44)
AST: 19 U/L (ref 15–41)
Albumin: 4 g/dL (ref 3.5–5.0)
Alkaline Phosphatase: 67 U/L (ref 38–126)
Anion gap: 13 (ref 5–15)
BUN: 17 mg/dL (ref 8–23)
CO2: 25 mmol/L (ref 22–32)
Calcium: 9.3 mg/dL (ref 8.9–10.3)
Chloride: 99 mmol/L (ref 98–111)
Creatinine, Ser: 0.94 mg/dL (ref 0.61–1.24)
GFR, Estimated: >60 mL/min (ref >60)
Glucose, Bld: 214 mg/dL — ABNORMAL HIGH (ref 70–99)
Potassium: 4.2 mmol/L (ref 3.5–5.1)
Sodium: 137 mmol/L (ref 135–145)
Total Bilirubin: 0.9 mg/dL (ref 0.0–1.2)
Total Protein: 6.9 g/dL (ref 6.5–8.1)

## 2024-01-01 LAB — CBC
HCT: 40 % (ref 39.0–52.0)
Hemoglobin: 13 g/dL (ref 13.0–17.0)
MCH: 29.7 pg (ref 26.0–34.0)
MCHC: 32.5 g/dL (ref 30.0–36.0)
MCV: 91.5 fL (ref 80.0–100.0)
Platelets: 251 K/uL (ref 150–400)
RBC: 4.37 MIL/uL (ref 4.22–5.81)
RDW: 13.2 % (ref 11.5–15.5)
WBC: 7.6 K/uL (ref 4.0–10.5)
nRBC: 0 % (ref 0.0–0.2)

## 2024-01-01 LAB — TYPE AND SCREEN
ABO/RH(D): O POS
Antibody Screen: NEGATIVE

## 2024-01-01 MED ORDER — LACTATED RINGERS IV BOLUS
1000.0000 mL | Freq: Once | INTRAVENOUS | Status: AC
  Administered 2024-01-01: 1000 mL via INTRAVENOUS

## 2024-01-01 MED ORDER — IOHEXOL 300 MG/ML  SOLN
100.0000 mL | Freq: Once | INTRAMUSCULAR | Status: AC | PRN
  Administered 2024-01-01: 100 mL via INTRAVENOUS

## 2024-01-01 MED ORDER — AMOXICILLIN-POT CLAVULANATE 875-125 MG PO TABS: 1.0000 | ORAL_TABLET | Freq: Two times a day (BID) | ORAL | 0 refills | Status: DC

## 2024-01-01 NOTE — ED Notes (Signed)
 Pt returning from CT imaging when this tech entered the room. Fall bundle verified and pt placed on pulse ox and bp monitoring. Pt denies any further needs at this time.

## 2024-01-01 NOTE — Discharge Instructions (Addendum)
 Every day while you are on a antibiotic, please use a probiotic and/or a serving of any food with "active cultures" such as activia yogurt, kefir milk, kombucha, or any other product that contains active cultures

## 2024-01-01 NOTE — ED Notes (Signed)
 Pt temp 94. Per Dr. Jossie hold off on further intervention at this time.

## 2024-01-01 NOTE — ED Provider Notes (Signed)
 [facility] Provider Note   Event Date/Time   First MD Initiated Contact with Patient 01/01/24 1103     (approximate) History  GI Bleeding  HPI Douglas Mercer is a 78 y.o. male with a past medical history of hyperlipidemia, uncontrolled type 2 diabetes, simple partial seizures, GERD, sigmoid diverticulosis, and rosacea who presents complaining of periumbilical abdominal pain intermittently that is followed with diarrhea.  Patient states that this diarrhea has become bloody this morning.  Patient denies any history of bloody diarrhea however states that over the past few weeks he has had intermittent diarrhea with associated periumbilical abdominal pain.  Patient states this pain resolves after an episode of diarrhea. ROS: Patient currently denies any vision changes, tinnitus, difficulty speaking, facial droop, sore throat, chest pain, shortness of breath, nausea/vomiting, dysuria, or weakness/numbness/paresthesias in any extremity   Physical Exam  Triage Vital Signs: ED Triage Vitals [01/01/24 1046]  Encounter Vitals Group     BP (!) 128/55     Girls Systolic BP Percentile      Girls Diastolic BP Percentile      Boys Systolic BP Percentile      Boys Diastolic BP Percentile      Pulse Rate 92     Resp 16     Temp (!) 94 F (34.4 C)     Temp Source Oral     SpO2 95 %     Weight 143 lb 15.4 oz (65.3 kg)     Height      Head Circumference      Peak Flow      Pain Score 0     Pain Loc      Pain Education      Exclude from Growth Chart    Most recent vital signs: Vitals:   01/01/24 1046  BP: (!) 128/55  Pulse: 92  Resp: 16  Temp: (!) 94 F (34.4 C)  SpO2: 95%   General: Awake, oriented x4. CV:  Good peripheral perfusion. Resp:  Normal effort. Abd:  No distention.  Mild periumbilical tenderness to palpation Other:  Elderly well-developed, well-nourished Caucasian male resting comfortably in no acute distress ED Results / Procedures / Treatments   Labs (all labs ordered are listed, but only abnormal results are displayed) Labs Reviewed  COMPREHENSIVE METABOLIC PANEL WITH GFR - Abnormal; Notable for the following components:      Result Value   Glucose, Bld 214 (*)    All other components within normal limits  CBC  LACTIC ACID, PLASMA  TYPE AND SCREEN   RADIOLOGY ED MD interpretation: CT of the abdomen and pelvis with IV contrast independently interpreted shows wall thickening of the descending colon with consistent with infectious or inflammatory colitis - All radiology independently interpreted and agree with radiology assessment Official radiology report(s): CT ABDOMEN PELVIS W CONTRAST Result Date: 01/01/2024 EXAM: CT ABDOMEN AND PELVIS WITH CONTRAST 01/01/2024 12:26:17 PM TECHNIQUE: CT of the abdomen and pelvis was performed with the administration of 100 mL of iohexol  (OMNIPAQUE ) 300 MG/ML solution. Multiplanar reformatted images are provided for review. Automated exposure control, iterative reconstruction, and/or weight-based adjustment of the mA/kV was utilized to reduce the radiation dose to as low as reasonably achievable. COMPARISON: 11/20/2023 CLINICAL HISTORY: Diverticulitis, complication suspected. FINDINGS: LOWER CHEST: Minimal bibasilar subsegmental atelectasis or scarring is noted. LIVER: The liver is unremarkable. GALLBLADDER AND BILE DUCTS: Cholelithiasis. No biliary ductal dilatation. SPLEEN: No acute abnormality. PANCREAS: No acute abnormality. ADRENAL GLANDS: No acute abnormality. KIDNEYS,  URETERS AND BLADDER: Small nonobstructive right renal calculus. No hydronephrosis. No perinephric or periureteral stranding. Mild urinary bladder distention is noted. GI AND BOWEL: Stomach demonstrates no acute abnormality. Wall thickening of descending colon is noted, most consistent with infectious or inflammatory colitis. There is no bowel obstruction. PERITONEUM AND RETROPERITONEUM: No ascites. No free air. VASCULATURE: Aorta is  normal in caliber. LYMPH NODES: No lymphadenopathy. REPRODUCTIVE ORGANS: Severe prostatic enlargement. BONES AND SOFT TISSUES: No acute osseous abnormality. No focal soft tissue abnormality. IMPRESSION: 1. Wall thickening of the descending colon, most consistent with infectious or inflammatory colitis. 2. Cholelithiasis. 3. Small nonobstructive right renal calculus. 4. Severe prostatic enlargement. Electronically signed by: Lynwood Seip MD 01/01/2024 01:08 PM EST RP Workstation: [id number]   PROCEDURES: Critical Care performed: No Procedures MEDICATIONS ORDERED IN ED: Medications  lactated ringers  bolus 1,000 mL (0 mLs Intravenous Stopped 01/01/24 1428)  iohexol  (OMNIPAQUE ) 300 MG/ML solution 100 mL (100 mLs Intravenous Contrast Given 01/01/24 1204)   IMPRESSION / MDM / ASSESSMENT AND PLAN / ED COURSE  I reviewed the triage vital signs and the nursing notes.                             The patient is on the cardiac monitor to evaluate for evidence of arrhythmia and/or significant heart rate changes. Patient's presentation is most consistent with acute presentation with potential threat to life or bodily function. 78 year old male presents complaining of bloody diarrhea in the setting of diverticulosis history. DDx: Diverticulitis, mesenteric ischemia, hemorrhoidal bleeding, rectal fissure Plan: CMP, CBC, lactic acid, type and screen  Laboratory and radiologic evaluation shows a likely descending colitis.  Given patient's history of diverticulosis, it is certainly possible that we will need to cover empirically with antibiotics.  Will cover patient with Augmentin  for the possibility of early diverticulitis.  Patient also given precautions for probiotic use during antibiotics.  Patient agrees to plan for discharge at this time with PCP follow-up and antibiotics.  Patient given strict return precautions and all questions answered prior to discharge  Dispo: Discharge home with PCP follow-up as  needed   FINAL CLINICAL IMPRESSION(S) / ED DIAGNOSES   Final diagnoses:  Diverticulitis large intestine w/o perforation or abscess w/bleeding   Rx / DC Orders   ED Discharge Orders          Ordered    amoxicillin -clavulanate (AUGMENTIN ) 875-125 MG tablet  2 times daily        01/01/24 1321           Note:  This document was prepared using Dragon voice recognition software and may include unintentional dictation errors.   Jossie Artist POUR, MD 01/02/24 [phone]

## 2024-01-01 NOTE — ED Triage Notes (Signed)
 C/O diarrhea x 1 day, today noticed blood in diarrhea.

## 2024-01-05 ENCOUNTER — Encounter: Payer: Self-pay | Admitting: Emergency Medicine

## 2024-01-05 ENCOUNTER — Emergency Department
Admission: EM | Admit: 2024-01-05 | Discharge: 2024-01-05 | Disposition: A | Discharge status: Home / Self Care | Attending: Emergency Medicine | Admitting: Emergency Medicine

## 2024-01-05 ENCOUNTER — Emergency Department

## 2024-01-05 ENCOUNTER — Other Ambulatory Visit: Payer: Self-pay

## 2024-01-05 DIAGNOSIS — R509 Fever, unspecified: Secondary | ICD-10-CM | POA: Insufficient documentation

## 2024-01-05 DIAGNOSIS — M545 Low back pain, unspecified: Secondary | ICD-10-CM | POA: Insufficient documentation

## 2024-01-05 DIAGNOSIS — M5459 Other low back pain: Secondary | ICD-10-CM | POA: Diagnosis not present

## 2024-01-05 DIAGNOSIS — K8020 Calculus of gallbladder without cholecystitis without obstruction: Secondary | ICD-10-CM | POA: Diagnosis not present

## 2024-01-05 DIAGNOSIS — N281 Cyst of kidney, acquired: Secondary | ICD-10-CM | POA: Diagnosis not present

## 2024-01-05 DIAGNOSIS — K5730 Diverticulosis of large intestine without perforation or abscess without bleeding: Secondary | ICD-10-CM | POA: Diagnosis not present

## 2024-01-05 LAB — URINALYSIS, ROUTINE W REFLEX MICROSCOPIC
Bilirubin Urine: NEGATIVE
Glucose, UA: NEGATIVE mg/dL
Hgb urine dipstick: NEGATIVE
Ketones, ur: NEGATIVE mg/dL
Leukocytes,Ua: NEGATIVE
Nitrite: NEGATIVE
Protein, ur: NEGATIVE mg/dL
Specific Gravity, Urine: 1.025 (ref 1.005–1.030)
pH: 7 (ref 5.0–8.0)

## 2024-01-05 LAB — CBC WITH DIFFERENTIAL/PLATELET
Abs Immature Granulocytes: 0.01 K/uL (ref 0.00–0.07)
Basophils Absolute: 0 K/uL (ref 0.0–0.1)
Basophils Relative: 0 %
Eosinophils Absolute: 0.1 K/uL (ref 0.0–0.5)
Eosinophils Relative: 2 %
HCT: 36.5 % — ABNORMAL LOW (ref 39.0–52.0)
Hemoglobin: 11.9 g/dL — ABNORMAL LOW (ref 13.0–17.0)
Immature Granulocytes: 0 %
Lymphocytes Relative: 14 %
Lymphs Abs: 0.8 K/uL (ref 0.7–4.0)
MCH: 29.8 pg (ref 26.0–34.0)
MCHC: 32.6 g/dL (ref 30.0–36.0)
MCV: 91.5 fL (ref 80.0–100.0)
Monocytes Absolute: 0.4 K/uL (ref 0.1–1.0)
Monocytes Relative: 8 %
Neutro Abs: 4.1 K/uL (ref 1.7–7.7)
Neutrophils Relative %: 76 %
Platelets: 229 K/uL (ref 150–400)
RBC: 3.99 MIL/uL — ABNORMAL LOW (ref 4.22–5.81)
RDW: 13.3 % (ref 11.5–15.5)
WBC: 5.4 K/uL (ref 4.0–10.5)
nRBC: 0 % (ref 0.0–0.2)

## 2024-01-05 LAB — COMPREHENSIVE METABOLIC PANEL WITH GFR
ALT: 13 U/L (ref 0–44)
AST: 19 U/L (ref 15–41)
Albumin: 4 g/dL (ref 3.5–5.0)
Alkaline Phosphatase: 59 U/L (ref 38–126)
Anion gap: 7 (ref 5–15)
BUN: 15 mg/dL (ref 8–23)
CO2: 26 mmol/L (ref 22–32)
Calcium: 8.8 mg/dL — ABNORMAL LOW (ref 8.9–10.3)
Chloride: 100 mmol/L (ref 98–111)
Creatinine, Ser: 1.02 mg/dL (ref 0.61–1.24)
GFR, Estimated: >60 mL/min (ref >60)
Glucose, Bld: 148 mg/dL — ABNORMAL HIGH (ref 70–99)
Potassium: 4.1 mmol/L (ref 3.5–5.1)
Sodium: 133 mmol/L — ABNORMAL LOW (ref 135–145)
Total Bilirubin: 0.6 mg/dL (ref 0.0–1.2)
Total Protein: 6.4 g/dL — ABNORMAL LOW (ref 6.5–8.1)

## 2024-01-05 LAB — RESP PANEL BY RT-PCR (RSV, FLU A&B, COVID)  RVPGX2
Influenza A by PCR: NEGATIVE
Influenza B by PCR: NEGATIVE
Resp Syncytial Virus by PCR: NEGATIVE
SARS Coronavirus 2 by RT PCR: NEGATIVE

## 2024-01-05 LAB — LACTIC ACID, PLASMA: Lactic Acid, Venous: 0.9 mmol/L (ref 0.5–1.9)

## 2024-01-05 LAB — LIPASE, BLOOD: Lipase: 55 U/L — ABNORMAL HIGH (ref 11–51)

## 2024-01-05 MED ORDER — IOHEXOL 300 MG/ML  SOLN
100.0000 mL | Freq: Once | INTRAMUSCULAR | Status: AC | PRN
  Administered 2024-01-05: 100 mL via INTRAVENOUS

## 2024-01-05 MED ORDER — LIDOCAINE 5 % EX PTCH
1.0000 | MEDICATED_PATCH | CUTANEOUS | Status: DC
  Administered 2024-01-05: 1 via TRANSDERMAL
  Filled 2024-01-05: qty 1

## 2024-01-05 MED ORDER — LIDOCAINE 5 % EX PTCH: 1.0000 | MEDICATED_PATCH | CUTANEOUS | 0 refills | Status: DC

## 2024-01-05 NOTE — Discharge Instructions (Addendum)
 He had no fever here no abscess on CT scan and his white count was normal.  Unclear what is causing this fever this morning but at this time I think you can monitor it at home.    Return to ER for fevers, worsening symptoms or any other concerns otherwise follow-up on Monday for a recheck of vital signs and return to the ER if he develops any other new symptoms or any other concerns  IMPRESSION: 1. No acute findings in the abdomen or pelvis 2. Streaky peribronchovascular opacities and bronchiectasis in the lower lobes bilaterally, stable compared to prior study 3. Cholelithiasis without evidence of cholecystitis 4. Colonic diverticulosis without evidence of diverticulitis 5. Simple-appearing bilateral renal cysts; no follow-up imaging recommended 6. Moderate prostatomegaly

## 2024-01-05 NOTE — ED Triage Notes (Signed)
 Pt via POV from home. Pt c/o fever and back pain since last night. Pt was seen on Tuesday for a diverticulitis. States he has not had a BM since Tuesday. Wife reports a temp of 100.9 this AM. Denies any antipyretics this AM. Pt is A&Ox4 and NAD, ambulatory to triage with steady gait.

## 2024-01-05 NOTE — ED Provider Notes (Signed)
 [facility] Provider Note    Event Date/Time   First MD Initiated Contact with Patient 01/05/24 [phone]     (approximate)   History   Fever and Back Pain   HPI  Douglas Mercer is a 78 y.o. male with history of diverticulitis who comes in with concerns for fever and back pain since last night.  Patient was seen on Tuesday was diagnosed with diarrheal type colitis.  He reports not having a bowel movement since Tuesday and he reports a temperature of 100.9.  He denies taking any antipyretics this morning.  Patient reports taking the Augmentin .  He reports that since having these symptoms he initially was feeling improved and he started feel worse again according to the wife they took a temperature and he had a fever this morning and did not take any fever reducers.  He reports some pain in the low back.  He reports not having a bowel movement since Tuesday.  He does report having blood in his stool prior to that.  He reports that he called his primary care doctor who told him that he needs to come in for IV antibiotics.  I reviewed a note from 01/01/2024 where patient had inflammatory colitis versus infectious colitis.  Physical Exam   Triage Vital Signs: ED Triage Vitals  Encounter Vitals Group     BP 01/05/24 0940 120/77     Girls Systolic BP Percentile --      Girls Diastolic BP Percentile --      Boys Systolic BP Percentile --      Boys Diastolic BP Percentile --      Pulse Rate 01/05/24 0940 91     Resp 01/05/24 0940 18     Temp 01/05/24 0940 99.1 F (37.3 C)     Temp Source 01/05/24 0940 Oral     SpO2 01/05/24 0940 100 %     Weight 01/05/24 0938 140 lb (63.5 kg)     Height 01/05/24 0938 5' 9 (1.753 m)     Head Circumference --      Peak Flow --      Pain Score 01/05/24 0938 2     Pain Loc --      Pain Education --      Exclude from Growth Chart --     Most recent vital signs: Vitals:   01/05/24 0940  BP: 120/77  Pulse: 91  Resp: 18   Temp: 99.1 F (37.3 C)  SpO2: 100%     General: Awake, no distress.  CV:  Good peripheral perfusion.  Resp:  Normal effort.  Abd:  No distention.  Slightly tender Other:  Some mild pain in the low back able to pick up both legs.   ED Results / Procedures / Treatments   Labs (all labs ordered are listed, but only abnormal results are displayed) Labs Reviewed  CBC WITH DIFFERENTIAL/PLATELET - Abnormal; Notable for the following components:      Result Value   RBC 3.99 (*)    Hemoglobin 11.9 (*)    HCT 36.5 (*)    All other components within normal limits  COMPREHENSIVE METABOLIC PANEL WITH GFR - Abnormal; Notable for the following components:   Sodium 133 (*)    Glucose, Bld 148 (*)    Calcium  8.8 (*)    Total Protein 6.4 (*)    All other components within normal limits  LIPASE, BLOOD - Abnormal; Notable for the following components:   Lipase  55 (*)    All other components within normal limits  URINALYSIS, ROUTINE W REFLEX MICROSCOPIC - Abnormal; Notable for the following components:   Color, Urine STRAW (*)    APPearance CLEAR (*)    All other components within normal limits  RESP PANEL BY RT-PCR (RSV, FLU A&B, COVID)  RVPGX2  CULTURE, BLOOD (ROUTINE X 2)  CULTURE, BLOOD (ROUTINE X 2)  LACTIC ACID, PLASMA     RADIOLOGY I have reviewed the ct personally and interpreted no kidney stone    PROCEDURES:  Critical Care performed: No  Procedures   MEDICATIONS ORDERED IN ED: Medications - No data to display   IMPRESSION / MDM / ASSESSMENT AND PLAN / ED COURSE  I reviewed the triage vital signs and the nursing notes.   Patient's presentation is most consistent with acute presentation with potential threat to life or bodily function.   Patient comes in with concerns for new fever, low back pain, currently on Augmentin  for possible diverticulitis.  Will get workup to evaluate for any anemia, Electra abnormalities, worsening leukocytosis.  He is afebrile here  without any fever reducers.  Will repeat CT scan to ensure no abscess, perforation.   1:15 PM reevaluated patient he really denies any back pain at this time.  He has no CTL spine tenderness.  According to the wife she has had him do some lifting around the house so that could just be musculoskeletal in nature.  Her CT imaging is reassuring.  There is no evidence of acute infection and his white count is normal I rechecked multiple temperatures and they have all been afebrile.  Considered MRI of his spine but he has no risk factors for spinal abscess, osteomyelitis.  He denies any IV drug use or any severe pain.  He currently denies any pain and this is without any interventions having been done I did order a lidocaine  patch just to help prevent the pain from coming back.  He is neuro intact.  Discussed with the wife and she agrees with holding off on MRI of the spine due to her very low suspicion but continue to monitor symptoms at home and if he does have a fever to return to the ER for evaluation.  We also did recommend getting a follow-up appointment and she states that she actually has 1 for herself on Monday and she will have the doctor see him instead.  1:58 PM repeat temperature remains 98.1 repeat exam remains soft and nontender he has no back pain.  His urine is negative for UTI.  He had a large amount of urine out so I doubt any severe retention that would require Foley catheter and we did discuss return precautions in regards to this.  At this time we discussed admission versus going home but given CT scan is reassuring and he has no fever here or evidence of abscess or worsening infection of the going to continue to monitor symptoms at home and return to the ER if he develops fevers or worsening symptoms or any other concerns he is otherwise neuro intact with equal strength in his legs sensations intact and without any evidence of severe infection at this time.     FINAL CLINICAL IMPRESSION(S)  / ED DIAGNOSES   Final diagnoses:  Low back pain, unspecified back pain laterality, unspecified chronicity, unspecified whether sciatica present     Rx / DC Orders   ED Discharge Orders          Ordered  lidocaine  (LIDODERM ) 5 %  Every 24 hours        01/05/24 1400             Note:  This document was prepared using Dragon voice recognition software and may include unintentional dictation errors.   Ernest Ronal BRAVO, MD 01/05/24 1400

## 2024-01-07 DIAGNOSIS — D649 Anemia, unspecified: Secondary | ICD-10-CM | POA: Diagnosis not present

## 2024-01-07 DIAGNOSIS — D6861 Antiphospholipid syndrome: Secondary | ICD-10-CM | POA: Diagnosis not present

## 2024-01-07 DIAGNOSIS — E119 Type 2 diabetes mellitus without complications: Secondary | ICD-10-CM | POA: Diagnosis not present

## 2024-01-07 DIAGNOSIS — R569 Unspecified convulsions: Secondary | ICD-10-CM | POA: Diagnosis not present

## 2024-01-08 ENCOUNTER — Encounter: Payer: Self-pay | Admitting: Urology

## 2024-01-09 ENCOUNTER — Telehealth: Payer: Self-pay

## 2024-01-09 NOTE — Telephone Encounter (Signed)
 Received Surgical Clearance from patient to hold Xarelto  and ASA for 3 days prior to surgery. Patient advised and verbalized understanding.

## 2024-01-10 ENCOUNTER — Other Ambulatory Visit

## 2024-01-10 DIAGNOSIS — N138 Other obstructive and reflux uropathy: Secondary | ICD-10-CM

## 2024-01-10 DIAGNOSIS — N401 Enlarged prostate with lower urinary tract symptoms: Secondary | ICD-10-CM | POA: Diagnosis not present

## 2024-01-10 DIAGNOSIS — N210 Calculus in bladder: Secondary | ICD-10-CM | POA: Diagnosis not present

## 2024-01-10 LAB — CULTURE, BLOOD (ROUTINE X 2)
Culture: NO GROWTH
Culture: NO GROWTH
Special Requests: ADEQUATE

## 2024-01-10 LAB — URINALYSIS, COMPLETE
Bilirubin, UA: NEGATIVE
Glucose, UA: NEGATIVE
Ketones, UA: NEGATIVE
Leukocytes,UA: NEGATIVE
Nitrite, UA: NEGATIVE
RBC, UA: NEGATIVE
Specific Gravity, UA: 1.03 (ref 1.005–1.030)
Urobilinogen, Ur: 1 mg/dL (ref 0.2–1.0)
pH, UA: 6 (ref 5.0–7.5)

## 2024-01-10 LAB — MICROSCOPIC EXAMINATION

## 2024-01-11 ENCOUNTER — Encounter
Admission: RE | Admit: 2024-01-11 | Discharge: 2024-01-11 | Disposition: A | Discharge status: Home / Self Care | Source: Ambulatory Visit | Attending: Urology | Admitting: Urology

## 2024-01-11 ENCOUNTER — Other Ambulatory Visit: Payer: Self-pay

## 2024-01-11 VITALS — Ht 69.0 in | Wt 140.0 lb

## 2024-01-11 DIAGNOSIS — I10 Essential (primary) hypertension: Secondary | ICD-10-CM

## 2024-01-11 DIAGNOSIS — E1165 Type 2 diabetes mellitus with hyperglycemia: Secondary | ICD-10-CM

## 2024-01-11 NOTE — Patient Instructions (Addendum)
 Your procedure is scheduled on: Friday 01/25/24 Report to the Registration Desk on the 1st floor of the [facility]. To find out your arrival time, please call [phone] between 1PM - 3PM on: Thursday 01/24/24 If your arrival time is 6:00 am, do not arrive before that time as the [facility] entrance doors do not open until 6:00 am.  REMEMBER: Instructions that are not followed completely may result in serious medical risk, up to and including death; or upon the discretion of your surgeon and anesthesiologist your surgery may need to be rescheduled.  Do not eat food or drink liquids after midnight the night before surgery.  No gum chewing or hard candies.  You may however, drink CLEAR liquids up to 2 hours before you are scheduled to arrive for your surgery. Do not drink anything within 2 hours of your scheduled arrival time.  One week prior to surgery: Stop Anti-inflammatories (NSAIDS) such as Advil, Aleve, Ibuprofen, Motrin, Naproxen, Naprosyn and Aspirin  based products such as Excedrin, Goody's Powder, BC Powder.  You may however, continue to take Tylenol  if needed for pain up until the day of surgery.  Stop ANY OVER THE COUNTER supplements and vitamins until after surgery.  Continue taking all of your other prescription medications up until the day of surgery.  ON THE DAY OF SURGERY ONLY TAKE THESE MEDICATIONS WITH SIPS OF WATER :  levETIRAcetam  (KEPPRA ) 1000 MG tablet  pantoprazole  (PROTONIX ) 40 MG tablet   Use inhalers on the day of surgery and bring to the hospital.  Fleets enema or bowel prep as directed.  No Alcohol  for 24 hours before or after surgery.  No Smoking including e-cigarettes for 24 hours before surgery.  No chewable tobacco products for at least 6 hours before surgery.  No nicotine patches on the day of surgery.  Do not use any recreational drugs for at least a week (preferably 2 weeks) before your surgery.  Please be advised that the combination of  cocaine and anesthesia may have negative outcomes, up to and including death. If you test positive for cocaine, your surgery will be cancelled.  On the morning of surgery brush your teeth with toothpaste and water , you may rinse your mouth with mouthwash if you wish. Do not swallow any toothpaste or mouthwash.  Shower prior to arrival.  Do not wear lotions, powders, deodorant or perfumes.  Do not shave body hair from the neck down 48 hours before surgery.  Wear comfortable clothing (specific to your surgery type) to the hospital.  Do not wear jewelry, make-up, hairpins, clips or nail polish.  For welded (permanent) jewelry: bracelets, anklets, waist bands, etc.  Please have this removed prior to surgery.  If it is not removed, there is a chance that hospital personnel will need to cut it off on the day of surgery.  Contact lenses, hearing aids and dentures may not be worn into surgery.  Do not bring valuables to the hospital. [facility] is not responsible for any missing/lost belongings or valuables.   Notify your doctor if there is any change in your medical condition (cold, fever, infection).  After surgery, you can help prevent lung complications by doing breathing exercises.  Take deep breaths and cough every 1-2 hours. Your doctor may order a device called an Incentive Spirometer to help you take deep breaths.  If you are being discharged the day of surgery, you will not be allowed to drive home. You will need a responsible individual to drive you home  and stay with you for 24 hours after surgery.   Please call the Pre-admissions Testing Dept. at [phone] if you have any questions about these instructions.  Surgery Visitation Policy:  Patients having surgery or a procedure may have two visitors.  Children under the age of 66 must have an adult with them who is not the patient.  Merchandiser, Retail to address health-related social needs:   [email or web address]

## 2024-01-14 LAB — CULTURE, URINE COMPREHENSIVE

## 2024-01-15 ENCOUNTER — Encounter
Admission: RE | Admit: 2024-01-15 | Discharge: 2024-01-15 | Disposition: A | Discharge status: Home / Self Care | Source: Ambulatory Visit | Attending: Urology | Admitting: Urology

## 2024-01-15 DIAGNOSIS — I10 Essential (primary) hypertension: Secondary | ICD-10-CM | POA: Insufficient documentation

## 2024-01-15 DIAGNOSIS — I4510 Unspecified right bundle-branch block: Secondary | ICD-10-CM | POA: Insufficient documentation

## 2024-01-15 DIAGNOSIS — Z01810 Encounter for preprocedural cardiovascular examination: Secondary | ICD-10-CM | POA: Diagnosis not present

## 2024-01-24 NOTE — Anesthesia Preprocedure Evaluation (Addendum)
 Anesthesia Evaluation  Patient identified by MRN, date of birth, ID band Patient awake    Reviewed: Allergy & Precautions, H&P , NPO status , Patient's Chart, lab work & pertinent test results  Airway Mallampati: II  TM Distance: >3 FB Neck ROM: full    Dental  (+) Poor Dentition   Pulmonary neg pulmonary ROS   Pulmonary exam normal        Cardiovascular Exercise Tolerance: Good hypertension, Normal cardiovascular exam     Neuro/Psych Seizures -,   negative psych ROS   GI/Hepatic negative GI ROS, Neg liver ROS,,,  Endo/Other  diabetes, Type 2    Renal/GU      Musculoskeletal   Abdominal   Peds  Hematology  (+) Blood dyscrasia, anemia   Anesthesia Other Findings Anti-Pl Ab syndrome; hx PE and DVT on Xarelto   Past Medical History: No date: Allergic rhinitis No date: Anti-phospholipid antibody syndrome No date: BPH (benign prostatic hyperplasia) No date: BPH (benign prostatic hypertrophy) No date: Deficiency of clotting factor No date: Diabetes mellitus without complication (HCC) 02/2009: DVT (deep venous thrombosis) (HCC)     Comment:  large left lower No date: ED (erectile dysfunction) No date: Epilepsy (HCC)     Comment:  last seizure 09/27/2017 No date: GERD (gastroesophageal reflux disease) No date: Glaucoma No date: Hyperlipidemia No date: Hypertension No date: Internal hemorrhoids No date: Low HDL (under 40) No date: Pneumonia 1992: Pulmonary embolism (HCC)     Comment:  bilateral w/ PE reportedly (+) lupus anticoagulant No date: Rosacea No date: Tubular adenoma of colon  Past Surgical History: 1959: ADENOIDECTOMY 12/27/2020: ANTERIOR CERVICAL DECOMP/DISCECTOMY FUSION; N/A     Comment:  Procedure: C3-6 ANTERIOR CERVICAL               DECOMPRESSION/DISCECTOMY FUSION 3 LEVELS;  Surgeon: Bluford Standing, MD;  Location: [facility] ORS;  Service: Neurosurgery;               Laterality: N/A; No  date: CATARACT EXTRACTION, BILATERAL     Comment:  L eye on 04/24/17 and R eye on 05/08/17. 05/07/2006: COLONOSCOPY 07/07/2002: colonoscopy with polypectomy     Comment:  2 small descending colon polyps 04/19/2001: colonoscopy with polypectomy     Comment:  6mm sessile mucosal polyp in distal sigmoid colon 05/03/2021: COLONOSCOPY WITH PROPOFOL ; N/A     Comment:  Procedure: COLONOSCOPY WITH PROPOFOL ;  Surgeon:               Maryruth Ole DASEN, MD;  Location: [facility];                Service: Endoscopy;  Laterality: N/A; 07/07/2002,04/19/2001: diverticulosis     Comment:  mild sigmoid No date: ESOPHAGOGASTRODUODENOSCOPY 05/03/2021: ESOPHAGOGASTRODUODENOSCOPY (EGD) WITH PROPOFOL ; N/A     Comment:  Procedure: ESOPHAGOGASTRODUODENOSCOPY (EGD) WITH               PROPOFOL ;  Surgeon: Maryruth Ole DASEN, MD;  Location:               [facility];  Service: Endoscopy;  Laterality: N/A;                DM No date: HERNIA REPAIR     Comment:  1995 and 08/13/2009 1995 /2011: INGUINAL HERNIA REPAIR     Comment:  right-sided, mesh placed No date: PROSTATE SURGERY     Comment:  18 biopsies.  Benign. 1959: TONSILLECTOMY 08/13/09: TRANSURETHRAL  RESECTION OF PROSTATE 08/13/2009: TRANSURETHRAL RESECTION OF PROSTATE     Reproductive/Obstetrics negative OB ROS                              Anesthesia Physical Anesthesia Plan  ASA: 3  Anesthesia Plan: General   Post-op Pain Management: Ofirmev  IV (intra-op)* and Toradol IV (intra-op)*   Induction: Intravenous  PONV Risk Score and Plan: 2 and Ondansetron  and Dexamethasone   Airway Management Planned: Oral ETT  Additional Equipment:   Intra-op Plan:   Post-operative Plan: Extubation in OR  Informed Consent: I have reviewed the patients History and Physical, chart, labs and discussed the procedure including the risks, benefits and alternatives for the proposed anesthesia with the patient or authorized representative  who has indicated his/her understanding and acceptance.     Dental Advisory Given  Plan Discussed with: CRNA and Surgeon  Anesthesia Plan Comments:          Anesthesia Quick Evaluation

## 2024-01-25 ENCOUNTER — Ambulatory Visit: Payer: Self-pay | Admitting: Anesthesiology

## 2024-01-25 ENCOUNTER — Encounter: Payer: Self-pay | Admitting: Anesthesiology

## 2024-01-25 ENCOUNTER — Other Ambulatory Visit: Payer: Self-pay

## 2024-01-25 ENCOUNTER — Encounter: Payer: Self-pay | Admitting: Urology

## 2024-01-25 ENCOUNTER — Encounter: Admission: RE | Disposition: A | Payer: Self-pay | Source: Home / Self Care | Attending: Urology

## 2024-01-25 ENCOUNTER — Ambulatory Visit: Admission: RE | Admit: 2024-01-25 | Discharge: 2024-01-25 | Disposition: A | Attending: Urology | Admitting: Urology

## 2024-01-25 DIAGNOSIS — E1165 Type 2 diabetes mellitus with hyperglycemia: Secondary | ICD-10-CM

## 2024-01-25 DIAGNOSIS — N210 Calculus in bladder: Secondary | ICD-10-CM | POA: Diagnosis not present

## 2024-01-25 DIAGNOSIS — N401 Enlarged prostate with lower urinary tract symptoms: Secondary | ICD-10-CM | POA: Diagnosis not present

## 2024-01-25 DIAGNOSIS — N138 Other obstructive and reflux uropathy: Secondary | ICD-10-CM

## 2024-01-25 HISTORY — PX: CYSTOSCOPY WITH LITHOLAPAXY: SHX1425

## 2024-01-25 HISTORY — PX: HOLEP-LASER ENUCLEATION OF THE PROSTATE WITH MORCELLATION: SHX6641

## 2024-01-25 LAB — GLUCOSE, CAPILLARY
Glucose-Capillary: 80 mg/dL (ref 70–99)
Glucose-Capillary: 107 mg/dL — ABNORMAL HIGH (ref 70–99)

## 2024-01-25 SURGERY — ENUCLEATION, PROSTATE, USING LASER, WITH MORCELLATION
Anesthesia: General | Site: Prostate

## 2024-01-25 MED ORDER — ONDANSETRON HCL 4 MG/2ML IJ SOLN
INTRAMUSCULAR | Status: AC
  Filled 2024-01-25: qty 2

## 2024-01-25 MED ORDER — STERILE WATER FOR IRRIGATION IR SOLN
Status: DC | PRN
  Administered 2024-01-25: 1000 mL

## 2024-01-25 MED ORDER — CHLORHEXIDINE GLUCONATE 0.12 % MT SOLN
OROMUCOSAL | Status: AC
  Filled 2024-01-25: qty 15

## 2024-01-25 MED ORDER — ONDANSETRON HCL 4 MG/2ML IJ SOLN
INTRAMUSCULAR | Status: DC | PRN
  Administered 2024-01-25: 4 mg via INTRAVENOUS

## 2024-01-25 MED ORDER — SODIUM CHLORIDE 0.9 % IV SOLN: INTRAVENOUS | Status: DC

## 2024-01-25 MED ORDER — LIDOCAINE HCL (PF) 2 % IJ SOLN
INTRAMUSCULAR | Status: AC
  Filled 2024-01-25: qty 5

## 2024-01-25 MED ORDER — SODIUM CHLORIDE 0.9 % IR SOLN
Status: DC | PRN
  Administered 2024-01-25: 15000 mL
  Administered 2024-01-25: 6000 mL
  Administered 2024-01-25: 15000 mL
  Administered 2024-01-25: 6000 mL

## 2024-01-25 MED ORDER — CEFAZOLIN SODIUM-DEXTROSE 2-4 GM/100ML-% IV SOLN
2.0000 g | INTRAVENOUS | Status: AC
  Administered 2024-01-25: 2 g via INTRAVENOUS

## 2024-01-25 MED ORDER — PROPOFOL 10 MG/ML IV BOLUS
INTRAVENOUS | Status: AC
  Filled 2024-01-25: qty 20

## 2024-01-25 MED ORDER — ACETAMINOPHEN 500 MG PO TABS
1000.0000 mg | ORAL_TABLET | Freq: Once | ORAL | Status: AC
  Administered 2024-01-25: 1000 mg via ORAL

## 2024-01-25 MED ORDER — OXYCODONE HCL 5 MG/5ML PO SOLN: 5.0000 mg | Freq: Once | ORAL | Status: DC | PRN

## 2024-01-25 MED ORDER — FENTANYL CITRATE (PF) 100 MCG/2ML IJ SOLN
INTRAMUSCULAR | Status: AC
  Filled 2024-01-25: qty 2

## 2024-01-25 MED ORDER — FENTANYL CITRATE (PF) 100 MCG/2ML IJ SOLN
INTRAMUSCULAR | Status: DC | PRN
  Administered 2024-01-25 (×2): 25 ug via INTRAVENOUS

## 2024-01-25 MED ORDER — TRAMADOL HCL 50 MG PO TABS: 25.0000 mg | ORAL_TABLET | Freq: Four times a day (QID) | ORAL | 0 refills | Status: AC | PRN

## 2024-01-25 MED ORDER — CELECOXIB 200 MG PO CAPS
200.0000 mg | ORAL_CAPSULE | Freq: Once | ORAL | Status: AC
  Administered 2024-01-25: 200 mg via ORAL

## 2024-01-25 MED ORDER — DROPERIDOL 2.5 MG/ML IJ SOLN: 0.6250 mg | Freq: Once | INTRAMUSCULAR | Status: DC | PRN

## 2024-01-25 MED ORDER — ACETAMINOPHEN 10 MG/ML IV SOLN: 1000.0000 mg | Freq: Once | INTRAVENOUS | Status: DC | PRN

## 2024-01-25 MED ORDER — OXYCODONE HCL 5 MG PO TABS: 5.0000 mg | ORAL_TABLET | Freq: Once | ORAL | Status: DC | PRN

## 2024-01-25 MED ORDER — CEFAZOLIN SODIUM-DEXTROSE 2-4 GM/100ML-% IV SOLN
INTRAVENOUS | Status: AC
  Filled 2024-01-25: qty 100

## 2024-01-25 MED ORDER — DEXAMETHASONE SOD PHOSPHATE PF 10 MG/ML IJ SOLN
INTRAMUSCULAR | Status: DC | PRN
  Administered 2024-01-25: 4 mg via INTRAVENOUS

## 2024-01-25 MED ORDER — ACETAMINOPHEN 500 MG PO TABS
ORAL_TABLET | ORAL | Status: AC
  Filled 2024-01-25: qty 2

## 2024-01-25 MED ORDER — FENTANYL CITRATE (PF) 100 MCG/2ML IJ SOLN: 25.0000 ug | INTRAMUSCULAR | Status: DC | PRN

## 2024-01-25 MED ORDER — PROPOFOL 10 MG/ML IV BOLUS
INTRAVENOUS | Status: DC | PRN
  Administered 2024-01-25: 130 mg via INTRAVENOUS

## 2024-01-25 MED ORDER — LIDOCAINE HCL (CARDIAC) PF 100 MG/5ML IV SOSY
PREFILLED_SYRINGE | INTRAVENOUS | Status: DC | PRN
  Administered 2024-01-25: 60 mg via INTRAVENOUS

## 2024-01-25 MED ORDER — SUGAMMADEX SODIUM 200 MG/2ML IV SOLN
INTRAVENOUS | Status: DC | PRN
  Administered 2024-01-25: 150 mg via INTRAVENOUS

## 2024-01-25 MED ORDER — CHLORHEXIDINE GLUCONATE 0.12 % MT SOLN
15.0000 mL | Freq: Once | OROMUCOSAL | Status: AC
  Administered 2024-01-25: 15 mL via OROMUCOSAL

## 2024-01-25 MED ORDER — ORAL CARE MOUTH RINSE: 15.0000 mL | Freq: Once | OROMUCOSAL | Status: AC

## 2024-01-25 MED ORDER — ROCURONIUM BROMIDE 100 MG/10ML IV SOLN
INTRAVENOUS | Status: DC | PRN
  Administered 2024-01-25: 30 mg via INTRAVENOUS
  Administered 2024-01-25: 10 mg via INTRAVENOUS

## 2024-01-25 MED ORDER — ROCURONIUM BROMIDE 10 MG/ML (PF) SYRINGE
PREFILLED_SYRINGE | INTRAVENOUS | Status: AC
  Filled 2024-01-25: qty 10

## 2024-01-25 MED ORDER — CELECOXIB 200 MG PO CAPS
ORAL_CAPSULE | ORAL | Status: AC
  Filled 2024-01-25: qty 1

## 2024-01-25 SURGICAL SUPPLY — 35 items
ADAPTER IRRIG TUBE 2 SPIKE SOL (ADAPTER) IMPLANT
BAG DRAIN SIEMENS DORNER NS (MISCELLANEOUS) ×3 IMPLANT
BAG URINE DRAIN 2000ML AR STRL (UROLOGICAL SUPPLIES) IMPLANT
BAG URO DRAIN 4000ML (MISCELLANEOUS) ×3 IMPLANT
BASKET ZERO TIP 1.9FR (BASKET) IMPLANT
CATH FOL 2WAY LX 16X5 (CATHETERS) IMPLANT
CATH SET URETHRAL DILATOR (CATHETERS) IMPLANT
CATH URETL OPEN 5X70 (CATHETERS) IMPLANT
CATH URETL OPEN END 4X70 (CATHETERS) ×3 IMPLANT
CATH URTH STD 24FR FL 3W 2 (CATHETERS) ×3 IMPLANT
CNTNR URN SCR LID CUP LEK RST (MISCELLANEOUS) IMPLANT
CONTAINER COLLECT MORCELLATR (MISCELLANEOUS) ×3 IMPLANT
DRAPE UTILITY 15X26 TOWEL STRL (DRAPES) IMPLANT
FIBER LASER MOSES 200 DFL (Laser) IMPLANT
FIBER LASER MOSES 365 DFL (Laser) IMPLANT
FIBER LASER MOSES 550 DFL (Laser) ×3 IMPLANT
FILTER OVERFLOW MORCELLATOR (FILTER) ×3 IMPLANT
GLOVE BIOGEL PI IND STRL 7.5 (GLOVE) ×6 IMPLANT
GOWN STRL REUS W/ TWL LRG LVL3 (GOWN DISPOSABLE) ×3 IMPLANT
GOWN STRL REUS W/ TWL XL LVL3 (GOWN DISPOSABLE) ×3 IMPLANT
HOLDER FOLEY CATH W/STRAP (MISCELLANEOUS) ×3 IMPLANT
KIT TURNOVER CYSTO (KITS) ×3 IMPLANT
MEMBRANE SLNG YLW 17 FOR INST (MISCELLANEOUS) ×3 IMPLANT
MORCELLATOR ROTATION 4.75 335 (MISCELLANEOUS) ×3 IMPLANT
PACK CYSTO AR (MISCELLANEOUS) ×3 IMPLANT
SET CYSTO IRRIGATION (SET/KITS/TRAYS/PACK) ×3 IMPLANT
SET IRRIG Y-TYPE CYSTO (SET/KITS/TRAYS/PACK) ×3 IMPLANT
SLEEVE PROTECTION STRL DISP (MISCELLANEOUS) ×6 IMPLANT
SOL .9 NS 3000ML IRR UROMATIC (IV SOLUTION) ×18 IMPLANT
SOLN STERILE WATER 500 ML (IV SOLUTION) ×3 IMPLANT
SOLN STERILE WATER BTL 1000 ML (IV SOLUTION) ×3 IMPLANT
SURGILUBE 2OZ TUBE FLIPTOP (MISCELLANEOUS) ×3 IMPLANT
SYRINGE TOOMEY IRRIG 70ML (MISCELLANEOUS) ×3 IMPLANT
TUBE PUMP MORCELLATOR PIRANHA (TUBING) ×3 IMPLANT
TUBING THERMACLEAR UROLOGY (TUBING) ×3 IMPLANT

## 2024-01-25 NOTE — H&P (Signed)
 01/25/24 7:10 AM   Douglas Mercer [birth year] [id number]  CC: BPH, obstruction, bladder stone, gross hematuria  HPI: 78 year old male with distant history of TURP, recurrent gross hematuria related to BPH, bladder stone, obstructive symptoms who opted for HOLEP.   PMH: Past Medical History:  Diagnosis Date   Allergic rhinitis    Anti-phospholipid antibody syndrome    BPH (benign prostatic hyperplasia)    BPH (benign prostatic hypertrophy)    Deficiency of clotting factor    Diabetes mellitus without complication (HCC)    DVT (deep venous thrombosis) (HCC) 02/2009   large left lower   ED (erectile dysfunction)    Epilepsy (HCC)    last seizure 09/27/2017   GERD (gastroesophageal reflux disease)    Glaucoma    Hyperlipidemia    Hypertension    Internal hemorrhoids    Low HDL (under 40)    Pneumonia    Pulmonary embolism (HCC) 1992   bilateral w/ PE reportedly (+) lupus anticoagulant   Rosacea    Tubular adenoma of colon     Surgical History: Past Surgical History:  Procedure Laterality Date   ADENOIDECTOMY  1959   ANTERIOR CERVICAL DECOMP/DISCECTOMY FUSION N/A 12/27/2020   Procedure: C3-6 ANTERIOR CERVICAL DECOMPRESSION/DISCECTOMY FUSION 3 LEVELS;  Surgeon: Bluford Standing, MD;  Location: [facility];  Service: Neurosurgery;  Laterality: N/A;   CATARACT EXTRACTION, BILATERAL     L eye on 04/24/17 and R eye on 05/08/17.   COLONOSCOPY  05/07/2006   colonoscopy with polypectomy  07/07/2002   2 small descending colon polyps   colonoscopy with polypectomy  04/19/2001   6mm sessile mucosal polyp in distal sigmoid colon   COLONOSCOPY WITH PROPOFOL  N/A 05/03/2021   Procedure: COLONOSCOPY WITH PROPOFOL ;  Surgeon: Maryruth Ole DASEN, MD;  Location: [facility];  Service: Endoscopy;  Laterality: N/A;   diverticulosis  07/07/2002,04/19/2001   mild sigmoid   ESOPHAGOGASTRODUODENOSCOPY     ESOPHAGOGASTRODUODENOSCOPY (EGD) WITH PROPOFOL  N/A 05/03/2021   Procedure:  ESOPHAGOGASTRODUODENOSCOPY (EGD) WITH PROPOFOL ;  Surgeon: Maryruth Ole DASEN, MD;  Location: [facility];  Service: Endoscopy;  Laterality: N/A;  DM   HERNIA REPAIR     1995 and 08/13/2009   INGUINAL HERNIA REPAIR  1995 /2011   right-sided, mesh placed   PROSTATE SURGERY     18 biopsies.  Benign.   TONSILLECTOMY  1959   TRANSURETHRAL RESECTION OF PROSTATE  08/13/09   TRANSURETHRAL RESECTION OF PROSTATE  08/13/2009       Family History: Family History  Problem Relation Age of Onset   Diabetes Mother    Melanoma Mother    COPD Mother    COPD Father    Heart attack Maternal Uncle    Stroke Maternal Aunt        grandparents   Colon cancer Paternal Uncle        x2   Prostate cancer Neg Hx    Seizures Neg Hx    Esophageal cancer Neg Hx     Social History:  reports that he has never smoked. He has never been exposed to tobacco smoke. He has never used smokeless tobacco. He reports that he does not currently use alcohol . He reports that he does not use drugs.  Physical Exam: BP 139/78   Pulse 78   Temp 97.6 F (36.4 C) (Tympanic)   Resp 16   SpO2 99%    Constitutional:  Alert and oriented, No acute distress. Cardiovascular: Regular rate and rhythm Respiratory: Clear to auscultation bilaterally GI: Abdomen  is soft, nontender, nondistended, no abdominal masses   Laboratory Data: Urine culture 11/20 no growth   Assessment & Plan:   78 year old male with distant history of TURP, 116 g prostate, obstructive symptoms, gross hematuria related to BPH who opted for HOLEP.  We discussed the risks and benefits of HoLEP at length.  The procedure requires general anesthesia and takes 1 to 2 hours, and a holmium laser is used to enucleate the prostate and push this tissue into the bladder.  A morcellator is then used to remove this tissue, which is sent for pathology.  The vast majority(>95%) of patients are able to discharge the same day with a catheter in place for 2 to 3 days,  and will follow-up in clinic for a voiding trial.  We specifically discussed the risks of bleeding, infection, retrograde ejaculation, temporary urgency and urge incontinence, very low risk of long-term incontinence, urethral stricture/bladder neck contracture, pathologic evaluation of prostate tissue and possible detection of prostate cancer or other malignancy, and possible need for additional procedures.  HOLEP and cystolitholapaxy today  Redell Burnet, MD 01/25/2024  [facility] [address] [place], [place] [address] [phone]

## 2024-01-25 NOTE — Op Note (Signed)
 Date of procedure: 01/25/24  Preoperative diagnosis:  Bladder stone BPH with obstruction Gross hematuria  Postoperative diagnosis:  Same  Procedure: Cystolitholapaxy (1cm) HoLEP (Holmium Laser Enucleation of the Prostate)  Surgeon: Redell Burnet, MD  Anesthesia: General  Complications: None  Intraoperative findings:  Large and irregular prostate regrowth from distant history of TURP 1 cm stone lodged within the prostatic urethra, pushed back into the bladder, fragmented, and irrigated free Moderate bladder trabeculations, no suspicious lesions Uncomplicated HOLEP, excellent hemostasis, ureteral orifices and verumontanum intact at conclusion of case  EBL: 10ml  Specimens: Prostate chips  Enucleation time: 39 minutes  Morcellation time: 26 minutes  Intra-op weight: 126g  Drains: 24 French three-way, 60 cc in balloon  Indication: Douglas Mercer is a 78 y.o. patient with BPH and obstructive symptoms, history of distant TURP in the past, and recurrent gross hematuria felt to be related to BPH, as well as a small bladder stone who opted for HOLEP and cystolitholapaxy.  After reviewing the management options for treatment, they elected to proceed with the above surgical procedure(s). We have discussed the potential benefits and risks of the procedure, side effects of the proposed treatment, the likelihood of the patient achieving the goals of the procedure, and any potential problems that might occur during the procedure or recuperation.  We specifically discussed the risks of bleeding, infection, hematuria and clot retention, need for additional procedures, possible overnight hospital stay, temporary urgency and incontinence, rare long-term incontinence, and retrograde ejaculation.  Informed consent has been obtained.   Description of procedure:  The patient was taken to the operating room and general anesthesia was induced.  The patient was placed in the dorsal lithotomy  position, prepped and draped in the usual sterile fashion, and preoperative antibiotics were administered.  SCDs were placed for DVT prophylaxis.  A preoperative time-out was performed.   Fleeta Needs sounds were used to gently dilated the urethra up to 64F. The 34 French continuous flow resectoscope was inserted into the urethra using the visual obturator  The prostate was large with irregular and obstructing lateral lobes, elevated bladder neck with irregular regrowth from prior TURP.  1 cm yellow stone within the prostatic urethra pushed backwards into the bladder. The bladder was thoroughly inspected and notable for moderate trabeculations.  The ureteral orifices were located in orthotopic position.    The laser was set to 2 J and 20 Hz and used to carefully fragment the 1 cm stone, and fragments were irrigated free from the bladder.  The laser was set to 2 J and 60 Hz and early apical release was performed by making a circumferential mucosal incision proximal to the sphincter.  A lambda incision was then made proximal to the verumontanum.  The prostate was enucleated en bloc circumferentially into the bladder.  Planes were more abnormal than typical with his prior history of TURP x 2.  The capsule was examined and laser was used for meticulous hemostasis.    The 36 French resectoscope was then switched out for the 26 French nephroscope and prostate tissue was morcellated(Piranha) and the tissue sent to pathology.  A 24 French three-way catheter was inserted easily with the aid of a catheter guide, and 60 cc were placed in the balloon.  Urine was pink.  The catheter irrigated easily with a Toomey syringe.  CBI was initiated.   The patient tolerated the procedure well without any immediate complications and was extubated and transferred to the recovery room in stable condition.  Urine  was clear on fast CBI.  Disposition: Stable to PACU  Plan: Wean CBI in PACU, anticipate discharge home today with  Foley removal in clinic in 2-3 days  Redell Burnet, MD 01/25/2024

## 2024-01-25 NOTE — Anesthesia Procedure Notes (Signed)
 Procedure Name: Intubation Date/Time: 01/25/2024 7:37 AM  Performed by: Myra Lawless, CRNAPre-anesthesia Checklist: Patient identified, Patient being monitored, Timeout performed, Emergency Drugs available and Suction available Patient Re-evaluated:Patient Re-evaluated prior to induction Oxygen Delivery Method: Circle system utilized Preoxygenation: Pre-oxygenation with 100% oxygen Induction Type: IV induction Ventilation: Mask ventilation without difficulty Laryngoscope Size: Mac and 4 Grade View: Grade I Tube type: Oral Tube size: 7.0 mm Number of attempts: 1 Airway Equipment and Method: Stylet Placement Confirmation: ETT inserted through vocal cords under direct vision, positive ETCO2 and breath sounds checked- equal and bilateral Secured at: 21 cm Tube secured with: Tape Dental Injury: Teeth and Oropharynx as per pre-operative assessment

## 2024-01-25 NOTE — Transfer of Care (Signed)
 Immediate Anesthesia Transfer of Care Note  Patient: Douglas Mercer  Procedure(s) Performed: ENUCLEATION, PROSTATE, USING LASER, WITH MORCELLATION (Prostate) CYSTOSCOPY, WITH BLADDER CALCULUS LITHOLAPAXY (Bladder)  Patient Location: PACU  Anesthesia Type:General  Level of Consciousness: drowsy  Airway & Oxygen Therapy: Patient Spontanous Breathing and Patient connected to face mask oxygen  Post-op Assessment: Report given to RN, Post -op Vital signs reviewed and stable, and Patient moving all extremities X 4  Post vital signs: Reviewed and stable  Last Vitals:  Vitals Value Taken Time  BP 142/98 01/25/24 09:15  Temp    Pulse 78 01/25/24 09:18  Resp 16 01/25/24 09:18  SpO2 99 % 01/25/24 09:18  Vitals shown include unfiled device data.  Last Pain:  Vitals:   01/25/24 0645  TempSrc: Tympanic  PainSc: 0-No pain         Complications: No notable events documented.

## 2024-01-25 NOTE — Anesthesia Postprocedure Evaluation (Signed)
 Anesthesia Post Note  Patient: Douglas Mercer  Procedure(s) Performed: ENUCLEATION, PROSTATE, USING LASER, WITH MORCELLATION (Prostate) CYSTOSCOPY, WITH BLADDER CALCULUS LITHOLAPAXY (Bladder)  Patient location during evaluation: PACU Anesthesia Type: General Level of consciousness: awake and alert Pain management: pain level controlled Vital Signs Assessment: post-procedure vital signs reviewed and stable Respiratory status: spontaneous breathing, nonlabored ventilation and respiratory function stable Cardiovascular status: blood pressure returned to baseline and stable Postop Assessment: no apparent nausea or vomiting Anesthetic complications: no   No notable events documented.   Last Vitals:  Vitals:   01/25/24 1000 01/25/24 1014  BP: 110/61 (!) 140/69  Pulse: 69 82  Resp: 13 16  Temp: 36.9 C 36.6 C  SpO2: 93% 100%    Last Pain:  Vitals:   01/25/24 1014  TempSrc: Temporal  PainSc: 0-No pain                 Camellia Merilee Louder

## 2024-01-26 ENCOUNTER — Encounter: Payer: Self-pay | Admitting: Urology

## 2024-01-26 NOTE — Progress Notes (Unsigned)
 Procedure: Holmium Laser Enucleation of the Prostate (HoLEP) w/ cystolitholapaxy   Operative Day: 12/05/20205  Subjective: Patient presents for planned Foley catheter removal following recent HoLEP. Reports catheter draining well at home with clear to light-pink urine. No fever, chills, abdominal pain, or difficulty with catheter function. Mild urethral discomfort noted, expected postoperatively. No other concerns expressed.  Pathology still pending ***  Objective: General: Alert, comfortable, no acute distress. Vitals: Stable (see nursing documentation).*** GU: Foley catheter in place draining clear/light-pink urine. No clots noted. Abdomen: Soft, non-tender, non-distended. Perineum/Meatus: No erythema, discharge, or signs of infection.  Assessment: Status post HoLEP, presenting for routine Foley removal. No signs of infection or obstruction. Appropriate candidate for trial of void.  Plan: - Foley catheter removed without difficulty. - Encourage increased oral hydration. - If unable to void or PVR elevated, discuss options including catheter replacement and follow-up plan. - Reviewed expected postoperative symptoms (urgency, frequency, mild dysuria, intermittent hematuria). - Provided return precautions: inability to void, worsening hematuria with clots, fever, severe pain. - Follow up as scheduled or sooner if concerns arise. - Patient will be contacted with pathology results

## 2024-01-28 ENCOUNTER — Ambulatory Visit: Admitting: Urology

## 2024-01-28 VITALS — BP 131/75 | HR 100 | Temp 97.8°F | Wt 140.0 lb

## 2024-01-28 DIAGNOSIS — R3989 Other symptoms and signs involving the genitourinary system: Secondary | ICD-10-CM

## 2024-01-28 LAB — SURGICAL PATHOLOGY

## 2024-01-28 MED ORDER — NITROFURANTOIN MONOHYD MACRO 100 MG PO CAPS: 100.0000 mg | ORAL_CAPSULE | Freq: Two times a day (BID) | ORAL | 0 refills | Status: AC

## 2024-01-28 NOTE — Patient Instructions (Signed)

## 2024-01-28 NOTE — Progress Notes (Signed)
 Error

## 2024-01-29 ENCOUNTER — Ambulatory Visit: Payer: Self-pay | Admitting: Urology

## 2024-03-04 ENCOUNTER — Other Ambulatory Visit: Payer: Self-pay | Admitting: Infectious Diseases

## 2024-03-04 DIAGNOSIS — R413 Other amnesia: Secondary | ICD-10-CM

## 2024-03-06 ENCOUNTER — Ambulatory Visit
Admission: RE | Admit: 2024-03-06 | Discharge: 2024-03-06 | Disposition: A | Discharge status: Home / Self Care | Payer: Medicare (Managed Care) | Source: Ambulatory Visit | Attending: Infectious Diseases | Admitting: Infectious Diseases

## 2024-03-06 DIAGNOSIS — R413 Other amnesia: Secondary | ICD-10-CM | POA: Diagnosis present

## 2024-03-18 ENCOUNTER — Other Ambulatory Visit: Payer: Self-pay

## 2024-03-18 DIAGNOSIS — R972 Elevated prostate specific antigen [PSA]: Secondary | ICD-10-CM

## 2024-03-20 ENCOUNTER — Encounter: Payer: Self-pay | Admitting: Oncology

## 2024-03-20 ENCOUNTER — Inpatient Hospital Stay: Payer: Medicare (Managed Care) | Attending: Oncology | Admitting: Oncology

## 2024-03-20 ENCOUNTER — Inpatient Hospital Stay: Payer: Medicare (Managed Care)

## 2024-03-20 VITALS — BP 126/74 | HR 84 | Temp 97.6°F | Resp 20 | Ht 69.0 in | Wt 136.1 lb

## 2024-03-20 DIAGNOSIS — R319 Hematuria, unspecified: Secondary | ICD-10-CM | POA: Insufficient documentation

## 2024-03-20 DIAGNOSIS — D509 Iron deficiency anemia, unspecified: Secondary | ICD-10-CM

## 2024-03-20 LAB — LACTATE DEHYDROGENASE: LDH: 170 U/L (ref 105–235)

## 2024-03-20 LAB — CBC (CANCER CENTER ONLY)
HCT: 39.1 % (ref 39.0–52.0)
Hemoglobin: 12 g/dL — ABNORMAL LOW (ref 13.0–17.0)
MCH: 29.1 pg (ref 26.0–34.0)
MCHC: 30.7 g/dL (ref 30.0–36.0)
MCV: 94.9 fL (ref 80.0–100.0)
Platelet Count: 258 10*3/uL (ref 150–400)
RBC: 4.12 MIL/uL — ABNORMAL LOW (ref 4.22–5.81)
RDW: 15.4 % (ref 11.5–15.5)
WBC Count: 6.3 10*3/uL (ref 4.0–10.5)
nRBC: 0 % (ref 0.0–0.2)

## 2024-03-20 LAB — IRON AND TIBC
Iron: 65 ug/dL (ref 45–182)
Saturation Ratios: 18 % (ref 17.9–39.5)
TIBC: 371 ug/dL (ref 250–450)
UIBC: 306 ug/dL

## 2024-03-20 LAB — FERRITIN: Ferritin: 30 ng/mL (ref 24–336)

## 2024-03-20 LAB — FOLATE: Folate: 7.8 ng/mL

## 2024-03-20 LAB — VITAMIN B12: Vitamin B-12: 1651 pg/mL — ABNORMAL HIGH (ref 180–914)

## 2024-03-20 NOTE — Progress Notes (Signed)
 " Douglas Mercer  [phone] Fax:(336) [phone]  ID: Douglas Mercer OB: Mercer  MR#: [id number]  RDW#:[id number]  Patient Care Team: Epifanio Alm SQUIBB, MD as PCP - General (Infectious Diseases) Jacobo Evalene PARAS, MD as Consulting Physician (Oncology)  CHIEF COMPLAINT: Iron deficiency anemia.  INTERVAL HISTORY: Patient is 79 year old male who was noted to have declining hemoglobin and iron stores possibly secondary to gross hematuria from recent prostate surgery.  His wife reports increased fatigue as well.  He has no neurologic complaints.  He denies any recent fevers or illnesses.  He has a good appetite and denies weight loss.  He has no chest pain, shortness of breath, cough, or hemoptysis.  He denies any nausea, vomiting, constipation, or diarrhea.  He has no melena or hematochezia.  He has persistent hematuria, but no other urinary complaints.  Patient has no further specific complaints today.  REVIEW OF SYSTEMS:   Review of Systems  Constitutional:  Positive for malaise/fatigue. Negative for fever and weight loss.  Respiratory: Negative.  Negative for cough, hemoptysis and shortness of breath.   Cardiovascular: Negative.  Negative for chest pain and leg swelling.  Gastrointestinal: Negative.  Negative for abdominal pain, blood in stool and melena.  Genitourinary:  Positive for hematuria.  Musculoskeletal: Negative.  Negative for back pain.  Skin: Negative.  Negative for rash.  Neurological: Negative.  Negative for dizziness, focal weakness, weakness and headaches.  Psychiatric/Behavioral:  Positive for memory loss. The patient is not nervous/anxious.     As per HPI. Otherwise, a complete review of systems is negative.  PAST MEDICAL HISTORY: Past Medical History:  Diagnosis Date   Allergic rhinitis    Anti-phospholipid antibody syndrome    BPH (benign prostatic hyperplasia)    BPH (benign prostatic hypertrophy)    Deficiency of clotting  factor    Diabetes mellitus without complication (HCC)    DVT (deep venous thrombosis) (HCC) 02/2009   large left lower   ED (erectile dysfunction)    Epilepsy (HCC)    last seizure 09/27/2017   GERD (gastroesophageal reflux disease)    Glaucoma    Hyperlipidemia    Hypertension    Internal hemorrhoids    Low HDL (under 40)    Pneumonia    Pulmonary embolism (HCC) 1992   bilateral w/ PE reportedly (+) lupus anticoagulant   Rosacea    Tubular adenoma of colon     PAST SURGICAL HISTORY: Past Surgical History:  Procedure Laterality Date   ADENOIDECTOMY  1959   ANTERIOR CERVICAL DECOMP/DISCECTOMY FUSION N/A 12/27/2020   Procedure: C3-6 ANTERIOR CERVICAL DECOMPRESSION/DISCECTOMY FUSION 3 LEVELS;  Surgeon: Bluford Standing, MD;  Location: [facility];  Service: Neurosurgery;  Laterality: N/A;   CATARACT EXTRACTION, BILATERAL     L eye on 04/24/17 and R eye on 05/08/17.   COLONOSCOPY  05/07/2006   colonoscopy with polypectomy  07/07/2002   2 small descending colon polyps   colonoscopy with polypectomy  04/19/2001   6mm sessile mucosal polyp in distal sigmoid colon   COLONOSCOPY WITH PROPOFOL  N/A 05/03/2021   Procedure: COLONOSCOPY WITH PROPOFOL ;  Surgeon: Maryruth Ole DASEN, MD;  Location: [facility];  Service: Endoscopy;  Laterality: N/A;   CYSTOSCOPY WITH LITHOLAPAXY N/A 01/25/2024   Procedure: CYSTOSCOPY, WITH BLADDER CALCULUS LITHOLAPAXY;  Surgeon: Francisca Redell JAYSON, MD;  Location: [facility];  Service: Urology;  Laterality: N/A;   diverticulosis  07/07/2002,04/19/2001   mild sigmoid   ESOPHAGOGASTRODUODENOSCOPY     ESOPHAGOGASTRODUODENOSCOPY (EGD) WITH PROPOFOL  N/A 05/03/2021  Procedure: ESOPHAGOGASTRODUODENOSCOPY (EGD) WITH PROPOFOL ;  Surgeon: Maryruth Ole DASEN, MD;  Location: [facility];  Service: Endoscopy;  Laterality: N/A;  DM   HERNIA REPAIR     1995 and 08/13/2009   HOLEP-LASER ENUCLEATION OF THE PROSTATE WITH MORCELLATION N/A 01/25/2024   Procedure: ENUCLEATION, PROSTATE, USING  LASER, WITH MORCELLATION;  Surgeon: Francisca Redell BROCKS, MD;  Location: [facility];  Service: Urology;  Laterality: N/A;   INGUINAL HERNIA REPAIR  1995 /2011   right-sided, mesh placed   PROSTATE SURGERY     18 biopsies.  Benign.   TONSILLECTOMY  1959   TRANSURETHRAL RESECTION OF PROSTATE  08/13/09   TRANSURETHRAL RESECTION OF PROSTATE  08/13/2009    FAMILY HISTORY: Family History  Problem Relation Age of Onset   Diabetes Mother    Melanoma Mother    COPD Mother    COPD Father    Heart attack Maternal Uncle    Stroke Maternal Aunt        grandparents   Colon cancer Paternal Uncle        x2   Prostate cancer Neg Hx    Seizures Neg Hx    Esophageal cancer Neg Hx     ADVANCED DIRECTIVES (Y/N):  N  HEALTH MAINTENANCE: Social History[1]   Colonoscopy:  PAP:  Bone density:  Lipid panel:  Allergies[2]  Current Outpatient Medications  Medication Sig Dispense Refill   aspirin  EC 81 MG tablet Take 81 mg by mouth daily. Swallow whole.     atorvastatin  (LIPITOR) 10 MG tablet Take 1 tablet (10 mg total) by mouth daily at 6 PM. 90 tablet 0   ferrous sulfate 325 (65 FE) MG tablet Take 325 mg by mouth daily with breakfast.     fluticasone  (FLONASE ) 50 MCG/ACT nasal spray Place 2 sprays into the nose daily. (Patient taking differently: Place 2 sprays into the nose daily as needed for allergies.) 48 g 0   glipiZIDE  (GLUCOTROL  XL) 2.5 MG 24 hr tablet Take 2.5 mg by mouth daily.     latanoprost  (XALATAN ) 0.005 % ophthalmic solution Place 1 drop into both eyes at bedtime.  2   levETIRAcetam  (KEPPRA ) 1000 MG tablet Take 1,000 mg by mouth 2 (two) times daily.     losartan  (COZAAR ) 25 MG tablet Take 25 mg by mouth daily.     mirtazapine (REMERON) 15 MG tablet Take 15 mg by mouth at bedtime.     pantoprazole  (PROTONIX ) 40 MG tablet Take 40 mg by mouth 2 (two) times daily.     Propylene Glycol 0.6 % SOLN Place 1 drop into both eyes 2 (two) times daily as needed (dry eyes).     timolol (TIMOPTIC)  0.5 % ophthalmic solution Place 1 drop into both eyes every morning.     vitamin B-12 (CYANOCOBALAMIN ) 1000 MCG tablet Take 1,000 mcg by mouth daily.     nitrofurantoin , macrocrystal-monohydrate, (MACROBID ) 100 MG capsule Take 1 capsule (100 mg total) by mouth every 12 (twelve) hours. (Patient not taking: Reported on 03/20/2024) 14 capsule 0   Omega-3 Fatty Acids (FISH OIL) 1200 MG CAPS Take 1 capsule by mouth daily at 12 noon. (Patient not taking: Reported on 03/20/2024)     No current facility-administered medications for this visit.    OBJECTIVE: Vitals:   03/20/24 1503  BP: 126/74  Pulse: 84  Resp: 20  Temp: 97.6 F (36.4 C)  SpO2: 100%     Body mass index is 20.1 kg/m.    ECOG FS:1 - Symptomatic but  completely ambulatory  General: Well-developed, well-nourished, no acute distress. Eyes: Pink conjunctiva, anicteric sclera. HEENT: Normocephalic, moist mucous membranes. Lungs: No audible wheezing or coughing. Heart: Regular rate and rhythm. Abdomen: Soft, nontender, no obvious distention. Musculoskeletal: No edema, cyanosis, or clubbing. Neuro: Alert, answering all questions appropriately. Cranial nerves grossly intact. Skin: No rashes or petechiae noted. Psych: Normal affect. Lymphatics: No cervical, calvicular, axillary or inguinal LAD.   LAB RESULTS:  Lab Results  Component Value Date   NA 133 (L) 01/05/2024   K 4.1 01/05/2024   CL 100 01/05/2024   CO2 26 01/05/2024   GLUCOSE 148 (H) 01/05/2024   BUN 15 01/05/2024   CREATININE 1.02 01/05/2024   CALCIUM  8.8 (L) 01/05/2024   PROT 6.4 (L) 01/05/2024   ALBUMIN  4.0 01/05/2024   AST 19 01/05/2024   ALT 13 01/05/2024   ALKPHOS 59 01/05/2024   BILITOT 0.6 01/05/2024   GFRNONAA >60 01/05/2024   GFRAA 66 07/09/2017    Lab Results  Component Value Date   WBC 6.3 03/20/2024   NEUTROABS 4.1 01/05/2024   HGB 12.0 (L) 03/20/2024   HCT 39.1 03/20/2024   MCV 94.9 03/20/2024   PLT 258 03/20/2024     STUDIES: MR  BRAIN WO CONTRAST Result Date: 03/12/2024 EXAM: MRI BRAIN WITHOUT CONTRAST 03/06/2024 02:03:21 PM TECHNIQUE: Multiplanar multisequence MRI of the head/brain was performed without the administration of intravenous contrast. COMPARISON: MR Head 10/21/2020. CLINICAL HISTORY: Memory loss x 3 months. FINDINGS: BRAIN AND VENTRICLES: There is no evidence of an acute infarct, intracranial hemorrhage, mass, midline shift, hydrocephalus, or extra-axial fluid collection. T2 hyperintensities in the cerebral white matter bilaterally are similar to the prior MRI and nonspecific but compatible with mild chronic small vessel ischemic disease. There is an unchanged small chronic left cerebellar infarct. Mild cerebral atrophy is unchanged and without clear lobar predominance. Major intracranial vascular flow voids are preserved. ORBITS: Bilateral cataract extraction. SINUSES AND MASTOIDS: Small mucous retention cyst in the left maxillary sinus. Clear mastoid air cells. BONES AND SOFT TISSUES: Normal marrow signal. No soft tissue abnormality. IMPRESSION: 1. No acute intracranial abnormality. 2. Unchanged mild chronic small vessel ischemic disease and mild cerebral atrophy. Electronically signed by: Dasie Hamburg MD 03/12/2024 09:33 AM EST RP Workstation: [id number]    ASSESSMENT: Iron deficiency anemia.  PLAN:    Iron deficiency anemia: Patient's hemoglobin is decreased, but stable at 12.0 today.  The remainder of his laboratory work including full iron panel is pending at time of dictation.  Likely secondary to ongoing hematuria from recent prostate surgery. He has been instructed to continue his oral iron supplementation.  Return to clinic 4 times over the next 1 to 2 weeks to receive 200 mg IV Venofer.  Patient will then return to clinic in 3 months with repeat laboratory work, further evaluation, and continuation of treatment if needed. Hematuria: Status post recent prostate surgery.  No malignancy reported.  Continue  follow-up with urology as scheduled.  I spent a total of 45 minutes reviewing chart data, face-to-face evaluation with the patient, counseling and coordination of care as detailed above.   Patient expressed understanding and was in agreement with this plan. He also understands that He can call clinic at any time with any questions, concerns, or complaints.    Evalene JINNY Reusing, MD   03/20/2024 4:02 PM        [1]  Social History Tobacco Use   Smoking status: Never    Passive exposure: Never   Smokeless tobacco: Never  Vaping Use   Vaping status: Never Used  Substance Use Topics   Alcohol  use: Not Currently    Comment: occassional every 2-3 months   Drug use: No  [2] No Known Allergies  "

## 2024-03-20 NOTE — Progress Notes (Signed)
 Patient states he has had blood in his urine since January 25, 2024. Patient states the blood in the urine did stop for a few days towards the middle of December but started back up shortly after.

## 2024-03-21 LAB — HAPTOGLOBIN: Haptoglobin: 90 mg/dL (ref 34–355)

## 2024-03-31 ENCOUNTER — Inpatient Hospital Stay: Payer: Medicare (Managed Care)

## 2024-04-01 ENCOUNTER — Other Ambulatory Visit: Payer: Medicare HMO

## 2024-04-02 ENCOUNTER — Inpatient Hospital Stay: Payer: Medicare (Managed Care)

## 2024-04-07 ENCOUNTER — Inpatient Hospital Stay: Payer: Medicare (Managed Care)

## 2024-04-08 ENCOUNTER — Ambulatory Visit: Payer: Medicare HMO | Admitting: Urology

## 2024-04-09 ENCOUNTER — Inpatient Hospital Stay: Payer: Medicare (Managed Care)

## 2024-04-10 ENCOUNTER — Ambulatory Visit: Admitting: Urology

## 2024-05-06 ENCOUNTER — Ambulatory Visit: Admitting: Urology

## 2024-05-26 ENCOUNTER — Inpatient Hospital Stay: Payer: Medicare (Managed Care)

## 2024-05-27 ENCOUNTER — Inpatient Hospital Stay: Payer: Medicare (Managed Care) | Admitting: Oncology

## 2024-05-27 ENCOUNTER — Inpatient Hospital Stay: Payer: Medicare (Managed Care)
